# Patient Record
Sex: Female | Born: 1975 | Hispanic: Yes | Marital: Married | Smoking: Never smoker
Health system: Southern US, Community
[De-identification: ages and names within clinical notes are randomized; demographics above are authoritative.]

## PROBLEM LIST (undated history)

## (undated) ENCOUNTER — Inpatient Hospital Stay (HOSPITAL_COMMUNITY): Payer: Self-pay

## (undated) DIAGNOSIS — O09529 Supervision of elderly multigravida, unspecified trimester: Principal | ICD-10-CM

## (undated) DIAGNOSIS — O3660X0 Maternal care for excessive fetal growth, unspecified trimester, not applicable or unspecified: Secondary | ICD-10-CM

## (undated) HISTORY — DX: Maternal care for excessive fetal growth, unspecified trimester, not applicable or unspecified: O36.60X0

## (undated) HISTORY — DX: Supervision of elderly multigravida, unspecified trimester: O09.529

---

## 1998-06-13 ENCOUNTER — Ambulatory Visit (HOSPITAL_COMMUNITY): Admission: RE | Admit: 1998-06-13 | Discharge: 1998-06-13 | Payer: Self-pay | Admitting: *Deleted

## 1998-07-17 ENCOUNTER — Inpatient Hospital Stay (HOSPITAL_COMMUNITY): Admission: AD | Admit: 1998-07-17 | Discharge: 1998-07-17 | Payer: Self-pay | Admitting: *Deleted

## 1998-07-17 ENCOUNTER — Encounter: Payer: Self-pay | Admitting: *Deleted

## 1998-10-22 ENCOUNTER — Encounter: Payer: Self-pay | Admitting: *Deleted

## 1998-10-22 ENCOUNTER — Ambulatory Visit (HOSPITAL_COMMUNITY): Admission: RE | Admit: 1998-10-22 | Discharge: 1998-10-22 | Payer: Self-pay | Admitting: *Deleted

## 1998-11-09 ENCOUNTER — Inpatient Hospital Stay (HOSPITAL_COMMUNITY): Admission: AD | Admit: 1998-11-09 | Discharge: 1998-11-09 | Payer: Self-pay | Admitting: Obstetrics & Gynecology

## 1998-11-13 ENCOUNTER — Encounter: Payer: Self-pay | Admitting: *Deleted

## 1998-11-13 ENCOUNTER — Inpatient Hospital Stay (HOSPITAL_COMMUNITY): Admission: RE | Admit: 1998-11-13 | Discharge: 1998-11-13 | Payer: Self-pay | Admitting: *Deleted

## 1998-11-21 ENCOUNTER — Encounter (INDEPENDENT_AMBULATORY_CARE_PROVIDER_SITE_OTHER): Payer: Self-pay | Admitting: Specialist

## 1998-11-21 ENCOUNTER — Inpatient Hospital Stay (HOSPITAL_COMMUNITY): Admission: AD | Admit: 1998-11-21 | Discharge: 1998-11-29 | Payer: Self-pay | Admitting: Obstetrics

## 1998-11-25 ENCOUNTER — Encounter: Payer: Self-pay | Admitting: *Deleted

## 2003-02-16 ENCOUNTER — Inpatient Hospital Stay (HOSPITAL_COMMUNITY): Admission: RE | Admit: 2003-02-16 | Discharge: 2003-02-19 | Payer: Self-pay | Admitting: *Deleted

## 2003-02-23 ENCOUNTER — Encounter: Admission: RE | Admit: 2003-02-23 | Discharge: 2003-02-23 | Payer: Self-pay | Admitting: *Deleted

## 2003-03-08 ENCOUNTER — Inpatient Hospital Stay (HOSPITAL_COMMUNITY): Admission: RE | Admit: 2003-03-08 | Discharge: 2003-03-11 | Payer: Self-pay | Admitting: *Deleted

## 2003-03-15 ENCOUNTER — Encounter: Admission: RE | Admit: 2003-03-15 | Discharge: 2003-03-15 | Payer: Self-pay | Admitting: *Deleted

## 2003-03-22 ENCOUNTER — Inpatient Hospital Stay (HOSPITAL_COMMUNITY): Admission: RE | Admit: 2003-03-22 | Discharge: 2003-03-24 | Payer: Self-pay | Admitting: Obstetrics & Gynecology

## 2003-03-22 ENCOUNTER — Encounter: Admission: RE | Admit: 2003-03-22 | Discharge: 2003-03-22 | Payer: Self-pay | Admitting: *Deleted

## 2003-03-29 ENCOUNTER — Encounter: Admission: RE | Admit: 2003-03-29 | Discharge: 2003-03-29 | Payer: Self-pay | Admitting: *Deleted

## 2003-04-05 ENCOUNTER — Encounter: Admission: RE | Admit: 2003-04-05 | Discharge: 2003-04-05 | Payer: Self-pay | Admitting: *Deleted

## 2003-04-06 ENCOUNTER — Encounter: Admission: RE | Admit: 2003-04-06 | Discharge: 2003-04-06 | Payer: Self-pay | Admitting: *Deleted

## 2003-04-06 ENCOUNTER — Encounter: Admission: RE | Admit: 2003-04-06 | Discharge: 2003-04-06 | Payer: Self-pay | Admitting: Obstetrics & Gynecology

## 2003-04-12 ENCOUNTER — Ambulatory Visit (HOSPITAL_COMMUNITY): Admission: RE | Admit: 2003-04-12 | Discharge: 2003-04-12 | Payer: Self-pay | Admitting: *Deleted

## 2003-04-12 ENCOUNTER — Encounter: Admission: RE | Admit: 2003-04-12 | Discharge: 2003-04-12 | Payer: Self-pay | Admitting: *Deleted

## 2003-04-15 ENCOUNTER — Inpatient Hospital Stay (HOSPITAL_COMMUNITY): Admission: AD | Admit: 2003-04-15 | Discharge: 2003-04-19 | Payer: Self-pay | Admitting: *Deleted

## 2003-04-26 ENCOUNTER — Encounter: Admission: RE | Admit: 2003-04-26 | Discharge: 2003-04-26 | Payer: Self-pay | Admitting: *Deleted

## 2003-05-03 ENCOUNTER — Encounter: Admission: RE | Admit: 2003-05-03 | Discharge: 2003-05-03 | Payer: Self-pay | Admitting: *Deleted

## 2003-05-05 ENCOUNTER — Encounter: Admission: RE | Admit: 2003-05-05 | Discharge: 2003-05-05 | Payer: Self-pay | Admitting: *Deleted

## 2003-05-10 ENCOUNTER — Encounter: Admission: RE | Admit: 2003-05-10 | Discharge: 2003-05-10 | Payer: Self-pay | Admitting: *Deleted

## 2003-05-12 ENCOUNTER — Encounter (INDEPENDENT_AMBULATORY_CARE_PROVIDER_SITE_OTHER): Payer: Self-pay | Admitting: Specialist

## 2003-05-12 ENCOUNTER — Inpatient Hospital Stay (HOSPITAL_COMMUNITY): Admission: AD | Admit: 2003-05-12 | Discharge: 2003-05-14 | Payer: Self-pay | Admitting: Obstetrics & Gynecology

## 2006-09-07 ENCOUNTER — Inpatient Hospital Stay (HOSPITAL_COMMUNITY): Admission: AD | Admit: 2006-09-07 | Discharge: 2006-09-07 | Payer: Self-pay | Admitting: Gynecology

## 2006-10-08 ENCOUNTER — Ambulatory Visit: Payer: Self-pay | Admitting: Family Medicine

## 2006-10-08 ENCOUNTER — Encounter (INDEPENDENT_AMBULATORY_CARE_PROVIDER_SITE_OTHER): Payer: Self-pay | Admitting: Family Medicine

## 2006-10-08 LAB — CONVERTED CEMR LAB
Antibody Screen: NEGATIVE
Basophils Absolute: 0 10*3/uL (ref 0.0–0.1)
Basophils Relative: 0 % (ref 0–1)
Eosinophils Absolute: 0 10*3/uL (ref 0.0–0.7)
Eosinophils Relative: 0 % (ref 0–5)
HCT: 39.6 % (ref 36.0–46.0)
Hemoglobin: 12.7 g/dL (ref 12.0–15.0)
Hepatitis B Surface Ag: NEGATIVE
Lymphocytes Relative: 18 % (ref 12–46)
Lymphs Abs: 2.1 10*3/uL (ref 0.7–3.3)
MCHC: 32.1 g/dL (ref 30.0–36.0)
MCV: 95.4 fL (ref 78.0–100.0)
Monocytes Absolute: 0.6 10*3/uL (ref 0.2–0.7)
Monocytes Relative: 5 % (ref 3–11)
Neutro Abs: 9.2 10*3/uL — ABNORMAL HIGH (ref 1.7–7.7)
Neutrophils Relative %: 77 % (ref 43–77)
Platelets: 267 10*3/uL (ref 150–400)
RBC: 4.15 M/uL (ref 3.87–5.11)
RDW: 14.8 % — ABNORMAL HIGH (ref 11.5–14.0)
Rh Type: POSITIVE
Rubella: 170.4 intl units/mL — ABNORMAL HIGH
WBC: 12 10*3/uL — ABNORMAL HIGH (ref 4.0–10.5)

## 2006-10-15 ENCOUNTER — Ambulatory Visit: Payer: Self-pay | Admitting: Family Medicine

## 2006-10-15 DIAGNOSIS — O24319 Unspecified pre-existing diabetes mellitus in pregnancy, unspecified trimester: Secondary | ICD-10-CM

## 2006-10-19 ENCOUNTER — Ambulatory Visit (HOSPITAL_COMMUNITY): Admission: RE | Admit: 2006-10-19 | Discharge: 2006-10-19 | Payer: Self-pay | Admitting: Sports Medicine

## 2006-10-21 ENCOUNTER — Ambulatory Visit: Payer: Self-pay | Admitting: Family Medicine

## 2006-10-23 ENCOUNTER — Encounter (INDEPENDENT_AMBULATORY_CARE_PROVIDER_SITE_OTHER): Payer: Self-pay | Admitting: *Deleted

## 2006-10-29 ENCOUNTER — Ambulatory Visit: Payer: Self-pay | Admitting: Obstetrics & Gynecology

## 2006-10-29 ENCOUNTER — Encounter: Payer: Self-pay | Admitting: Obstetrics & Gynecology

## 2006-10-30 ENCOUNTER — Ambulatory Visit: Payer: Self-pay | Admitting: Gynecology

## 2006-10-30 ENCOUNTER — Encounter: Payer: Self-pay | Admitting: Obstetrics & Gynecology

## 2006-11-02 ENCOUNTER — Encounter: Admission: RE | Admit: 2006-11-02 | Discharge: 2006-11-02 | Payer: Self-pay | Admitting: Family Medicine

## 2006-11-02 ENCOUNTER — Ambulatory Visit: Payer: Self-pay | Admitting: Family Medicine

## 2006-11-09 ENCOUNTER — Ambulatory Visit: Payer: Self-pay | Admitting: Obstetrics & Gynecology

## 2006-11-12 ENCOUNTER — Ambulatory Visit: Payer: Self-pay | Admitting: Physician Assistant

## 2006-11-12 ENCOUNTER — Ambulatory Visit: Payer: Self-pay | Admitting: Obstetrics and Gynecology

## 2006-11-12 ENCOUNTER — Inpatient Hospital Stay (HOSPITAL_COMMUNITY): Admission: AD | Admit: 2006-11-12 | Discharge: 2006-11-15 | Payer: Self-pay | Admitting: Family Medicine

## 2006-11-13 ENCOUNTER — Encounter: Payer: Self-pay | Admitting: Family Medicine

## 2006-12-02 ENCOUNTER — Ambulatory Visit: Payer: Self-pay | Admitting: Obstetrics and Gynecology

## 2006-12-09 ENCOUNTER — Encounter: Payer: Self-pay | Admitting: *Deleted

## 2006-12-11 ENCOUNTER — Ambulatory Visit: Payer: Self-pay | Admitting: Family Medicine

## 2010-08-27 NOTE — Group Therapy Note (Signed)
NAME:  Rachael Russo, Rachael Russo NO.:  [id number]   MEDICAL RECORD NO.:  [id number]          PATIENT TYPE:  WOC   LOCATION:  [facility]                   FACILITY:  [facility]   PHYSICIAN:  Tinnie Gens, MD        DATE OF BIRTH:  December 29, 1975   DATE OF SERVICE:                                  CLINIC NOTE   Her information related to her hospitalization is under a different  medical record number, which is [id number] and is under the name Rachael Russo.   CHIEF COMPLAINT:  Abnormal bleeding.   HISTORY OF PRESENT ILLNESS:  The patient is a 35 year old gravida 3,  para 1-2-0-2 who had a 21-week admission for chorio and fever of unknown  origin, who subsequently went to labor and delivery.  A non-viable fetus  at 21 weeks.  The patient did have MRI, which showed normal-appearing  appendix.  The patient was seen here for post SAB followup and was doing  well.  She was started on Loestrin.  She started this on Sunday, and  then started bleeding approximately 3 days ago.  She reports a little  bit of pain.  The patient denies fevers, chills, nausea or vomiting.   EXAM:  VITAL SIGNS:  Noted in the chart.  She is a well-developed, well-nourished female, in no acute distress.  ABDOMEN:  Soft, nontender, nondistended.  GENITOURINARY:  Normal external female genitalia.  BUS glands normal.  Vagina is pink and rugous.  Cervix was closed.  The uterus was nontender  and small, anteverted.  No adnexal mass or tenderness.   IMPRESSION:  Abnormal bleeding after starting hormones.   PLAN:  1. Continue OCs for the next 2 weeks.  The patient will have a      followup appointment at that time.  If she is continuing to bleed,      may need to change her to a higher dose __________ , or if the      patient does not have any further bleeding, she can cancel her      followup appointment.  2. Gestational diabetes.  I have encouraged the patient to have      excellent blood sugar control prior to  attempting to achieve      pregnancy again.  I have encouraged her to get a primary care      physician.           ______________________________  Tinnie Gens, MD     TP/MEDQ  D:  12/11/2006  T:  12/12/2006  Job:  [id number]

## 2010-08-27 NOTE — Discharge Summary (Signed)
Rachael Russo, Rachael Russo               ACCOUNT NO.:  [id number]   MEDICAL RECORD NO.:  [id number]          PATIENT TYPE:  INP   LOCATION:  9310                          [facility]   PHYSICIAN:  Tanya S. Shawnie Pons, M.D.   DATE OF BIRTH:  June 24, 1975   DATE OF ADMISSION:  11/12/2006  DATE OF DISCHARGE:  11/15/2006                               DISCHARGE SUMMARY   Patient is a 35 year old Spanish-speaking Hispanic female who was  admitted at 16 and 1 weeks with severe left lower quadrant pain, no  visualization or laboratory studies appeared to be abnormal, that  included a CT scan, ultrasound and much blood work.  __________ she ran  a high fever and her vital signs, high fever to 103 on admission.  Blood  cultures were taken which have been negative and she had a tachycardia  up to 139.  Her blood pressure was always normal at about 115/58, there  was one drop at one time to 81/40 but had not been reported to the  physician's service, by the time they had seen her again she had  normalized her blood pressure.  Even on Physical Examination she had  vague left lower quadrant pain without guarding or rebound and when  distracted did not even seem to show signs of pain.  Nothing really was  found on this patient until the early morning of July 2 when she began  complaining of pain, was found to be 6 cm dilated and eventually  spontaneously delivered a nonviable fetus.  We kept her for 24 hours  longer because of the fever, her white count is normal at 13,000, her  temperature has been afebrile and we are going to discharge her to be  followed up to observe the pathology in the clinic in 2 weeks.   DISCHARGE DIAGNOSIS:  Abdominal pain and preterm delivery probably  secondary to high fever of unknown etiology.      Phil D. Okey Dupre, M.D.      Shelbie Proctor. Shawnie Pons, M.D.  Electronically Signed    PDR/MEDQ  D:  11/15/2006  T:  11/15/2006  Job:  [id number]

## 2010-08-30 NOTE — Discharge Summary (Signed)
Rachael Russo, Rachael Russo                           ACCOUNT NO.:  [id number]   MEDICAL RECORD NO.:  [id number]                   PATIENT TYPE:  INP   LOCATION:  9152                                 [facility]   PHYSICIAN:  Anastasio Auerbach, MD                    DATE OF BIRTH:  05-14-1975   DATE OF ADMISSION:  03/08/2003  DATE OF DISCHARGE:  03/11/2003                                 DISCHARGE SUMMARY   DISCHARGE DIAGNOSIS:  Threatened pre-term labor at 26-3/7 weeks.   DISCHARGE MEDICATIONS:  Procardia XL 30 mg b.i.d.   DISPOSITION:  The patient discharged to home on bedrest with bathroom  privileges only.   FOLLOW UP:  The patient advised to call on Monday, March 13, 2003 for  appointment in [facility] on next Wednesday.   HISTORY OF PRESENT ILLNESS:  This is a 35 year old G2, P1-0-0-1 who  presented at 26-3/7 weeks for repeat ultrasound. On ultrasound it was found  that there was no residual cervical length by ultrasound. The patient was  not contracting at the time. She had been admitted previously on November 4  to November 7 for threatened pre-term labor at that time.   HOSPITAL COURSE:  The patient was admitted and placed on p.o. Terbutaline  and the Procardia XL was stopped secondary to the patient reporting a rash  with the Procardia. However, after questioning the patient more with her  husband present considering this patient does not speak English, it was  reported that the rash was just on her hands and itched and did not feel  that this was secondary to the Procardia, so the patient was placed back on  the Procardia and told to followup with [facility] if the rash was to  develop again. The patient did not contract throughout the hospitalization.  Her cervix remained closed. At discharge, patient's cervix was closed,  approximately 80% to 90% effaced with negative 3 station. Ultrasound was  done while here to evaluate pregnancy with normal amniotic fluid,  posterior  placental location, cephalic presentation. The patient was placed on bedrest  and she did receive betamethasone x2 during hospitalization.   PLAN:  The plan for discharge was for the patient to continue bedrest at  home. Continue the Procardia XL 30 mg b.i.d. and to followup with [facility].   DISCHARGE LABORATORY DATA:  The patient had a non-reactive RPR. Wet prep was  negative. Fetal fibronectin was positive.                                               Anastasio Auerbach, MD    AD/MEDQ  D:  03/11/2003  T:  03/11/2003  Job:  [id number]

## 2010-08-30 NOTE — Discharge Summary (Signed)
Rachael Russo, Rachael Russo                           ACCOUNT NO.:  [id number]   MEDICAL RECORD NO.:  [id number]                   PATIENT TYPE:  INP   LOCATION:  9155                                 [facility]   PHYSICIAN:  Lesly Dukes, M.D.              DATE OF BIRTH:  1975/07/12   DATE OF ADMISSION:  03/22/2003  DATE OF DISCHARGE:                                 DISCHARGE SUMMARY   DISCHARGE DIAGNOSES:  1. Decreased cervical length.  2. Preterm labor.  3. Newly-diagnosed gestational diabetes mellitus.   DISCHARGE MEDICATIONS:  1. Procardia XL 30 mg one p.o. daily.  2. Prenatal vitamins one p.o. daily.   DISPOSITION:  Home.   FOLLOW-UP:  At the [facility] in one week.  The patient is instructed  to call for an appointment.   CONSULTATIONS:  Nutrition for diabetes counseling.   HISTORY AND PHYSICAL:  This is a 35 year old G2 P1-0-0-1 at 54 and four-  sevenths weeks with an estimated due date of June 11, 2003.  She  presents with no cervical length on ultrasound but no bleeding or  contractions are noted and active fetal movement.  She had a positive fetal  fibronectin on November 24.  She was sent from ultrasound for no cervical  length.  She was also admitted November 24 through November 27 and she had  betamethasone x2 at this time - the dates of betamethasone were November 24  and November 25.  She has also been treated with Indocin and Unasyn for  preterm labor as well as Procardia at home for preterm labor.  She has  gotten her care at the [facility].  She has had two previous  admissions for shortened cervix.  She was closed and 50% on February 16, 2003  and closed and 100% on February 23, 2003.  Medications are Procardia XL 30  mg one p.o. b.i.d. and a prenatal vitamin one p.o. daily.  Obstetrical  history:  Her previous pregnancy she had a low transverse cesarean section  for failure to progress and increased blood pressure, gave birth to a term  female infant.  No gynecological history, no medical history.  Surgical  history for the low transverse cesarean section.  Family medical history:  Diabetes mellitus.  She is O positive blood, antibody screen was negative,  hemoglobin 12.6, platelets 250, rubella immune, negative for hepatitis B,  negative for syphilis, negative for HIV, negative for GC, negative for  chlamydia, negative GBS on February 16, 2003.  Her physical exam is within  normal limits.  Digital cervical exam:  She is 1, 90%, and ballotable, and  cephalic presentation.  External fetal monitoring:  Baseline rate 140s with  good reactivity, average variability, and no decelerations.  She has no  contractions.  Assessment:  Rule out preterm labor and shortened cervical  length.  Plan was admit, place the patient in  Trendelenburg, and continue  Procardia.   HOSPITAL COURSE:  #1 - PRETERM LABOR.  The patient did not have any  contractions during her hospitalization and never suffered any rupture of  membranes during this hospitalization.  It should be noted that at a  previous visit on November 24 and November 25 she received two doses of  betamethasone.   #2 - SHORTENED CERVICAL LENGTH.  The patient was maintained in Trendelenburg  and with complete bedrest and at discharge her cervix was dilated 1 cm,  approximately 50 % effaced, with a ballotable head.   #3 - GESTATIONAL DIABETES, NEWLY DIAGNOSED.  She had a fasting fingerstick  blood sugar of 118 and a two-hour postprandial fingerstick blood sugar which  was 111.  She has had counseling regarding a gestational diabetes diet and  this should be followed up carefully as an outpatient.     Ursula Beath, MD                     Lesly Dukes, M.D.    JT/MEDQ  D:  03/24/2003  T:  03/24/2003  Job:  [id number]   cc:   [facility]

## 2010-08-30 NOTE — Discharge Summary (Signed)
Rachael Russo, Rachael Russo                           ACCOUNT NO.:  [id number]   MEDICAL RECORD NO.:  [id number]                   PATIENT TYPE:  INP   LOCATION:  9158                                 [facility]   PHYSICIAN:  Phil D. Okey Dupre, M.D.                  DATE OF BIRTH:  [birth year]   DATE OF ADMISSION:  04/15/2003  DATE OF DISCHARGE:                                 DISCHARGE SUMMARY   DISCHARGE DIAGNOSES:  1. Threatened preterm labor.  2. Gestational diabetes mellitus.   HOSPITAL COURSE:  Problem 1. THREATENED PRETERM LABOR.  Patient is a 35-year-  old female who presented to [facility] as a direct admission from the  clinic with uterine irritability.  Given her history of a shortened cervix  there was a concern for threatened preterm labor.  The patient was admitted.  On April 15, 2003 the patient had a sterile vaginal exam which showed a  loose 1 cm and a bag at the external os.  Patient received p.o. terbutaline  and Unasyn to help.  Also of note the patient had been transiently on  Procardia but that was discontinued.  Patient did well throughout her  hospital course with no change in cervical dilatation.  The fetal heart  tracings were reassuring and mom was set up for discharge on April 19, 2003  with followup at her regular clinic.  She will be continued on her  terbutaline and switched from Unasyn to Augmentin.  Of note the patient has  already received two doses of betamethasone in the past for fetal lung  maturity.   Problem 2. GESTATIONAL DIABETES.  Initially the patient had a CBG of 114  then had pre-meals of 96-133 and post-meals as high as 147-148.  The  decision was made to give her diabetic education and begin insulin.  She was  initially started on NPH Insulin at a dose of 6 units b.i.d.  Over time this  was increased to 10 units of NPH b.i.d. with response in lowering the  patient's CBGs.  She was still having rather high postprandial's as high as  150  and the decision was made to give her Regular Insulin with meals.  She  was started on initially 5 units of Regular Insulin with breakfast and with  dinner in combination with her NPH of 10 units at bedtime and morning.  On  the day of discharge her sugars had come down significantly and preprandial  were in the range of 78-90 and postprandial were 112-150.  On discharge her  Regular will be titrated up to 7 units with breakfast and dinner and to  continue her NPH up to 10 units b.i.d.  She will be discharged with diabetic  education and to monitor her blood sugars before meals and 2 hours  postprandial dinner.  We will have her follow up for review of her blood  sugars within 1 week's time.   Discharge medications will include NPH Insulin 10 units in the morning  subcutaneously and 10 units at bedtime subcutaneously, Regular Insulin 7  units subcutaneously with breakfast and 7 units with dinner.  Patient also  will be discharged on Augmentin 875/125 b.i.d. for a total of 10 days to  complete antibiotic therapy and also continue her terbutaline as scheduled.  The terbutaline will continue until she is followed up at her clinic visit  next week 7 days from now and that will be dosed at 2.5 mg p.o. every 4  hours.   The patient will be discharged with labor precautions, preeclampsia  precautions, and preterm labor precautions as well as to have pelvic rest  and no overactivity.  She should notify her doctor with any concerns  whatsoever.     Rachael Russo, M.D.                   Phil D. Okey Dupre, M.D.    GJO/MEDQ  D:  04/19/2003  T:  04/19/2003  Job:  [id number]

## 2010-08-30 NOTE — Discharge Summary (Signed)
   Rachael Russo, Rachael Russo                           ACCOUNT NO.:  [id number]   MEDICAL RECORD NO.:  [id number]                   PATIENT TYPE:  INP   LOCATION:  9152                                 [facility]   PHYSICIAN:  Jonah Blue, M.D.                DATE OF BIRTH:  10-13-1975   DATE OF ADMISSION:  02/16/2003  DATE OF DISCHARGE:  02/19/2003                                 DISCHARGE SUMMARY   DISCHARGE DIAGNOSES:  Threatened pre-term labor at 35 and [redacted] weeks gestation.   DISCHARGE MEDICATIONS:  1. Procardia XL 30 mg p.o. b.i.d.  2. Prenatal vitamins 1 p.o. q.d.   FOLLOW UP:  The patient is to followup at the [facility] either  Wednesday, February 22, 2003 or Thursday, February 23, 2003.   HISTORY OF PRESENT ILLNESS:  A 35 year old G2, P1 presenting at 23-[redacted] weeks  gestation with a short cervix on ultrasound measuring 1.2 cm. Physical  examination showed the cervix to be fingertip, tight, 50% and high. The  patient was admitted for bedrest, Unasyn and Indocin.   HOSPITAL COURSE:  The patient did not have further development of her lower  uterine segment or difficulties during her hospitalization. The Indocin was  stopped and the patient was started on Procardia without further development  of contractions and thus, she was deemed appropriate for discharge to home.  Because she had had a history of PIH with her last pregnancy, a 24 hour  urine and PIH labs were obtained. PIH labs were completely normal. The 24  hour urine was pending at the time of discharge. SBE on discharge is  fingertip, 2 to 3 cm, thick, with minimal lower uterine segment development.   LABORATORY DATA:  White blood cell 7.2, hemoglobin 12.0, platelets 235,000.  Sodium 137, potassium 3.6, chloride 106, CO2 24, glucose 84, BUN 6,  creatinine 0.6, calcium 9.5, total protein 6.7, albumin 3.4, AST 15, ALT  less than 19, alkaline phosphatase 64, total bilirubin 0.4, LDH 110, uric  acid 3.7. A 24 hour  urine total volume 3,400 cc. Total protein 1 mg in 24  hours. Group B strep negative. Wet prep with few WBC's, otherwise negative.  GC and Chlamydia negative. RPR negative.   DISPOSITION:  The patient was discharged to home on hospital day 2 without  further difficulty.                                               Jonah Blue, M.D.    Milas Gain  D:  02/19/2003  T:  02/19/2003  Job:  [id number]   cc:   [facility]

## 2011-01-27 LAB — COMPREHENSIVE METABOLIC PANEL
ALT: 14
AST: 18
Albumin: 3.2 — ABNORMAL LOW
Alkaline Phosphatase: 70
BUN: 6
CO2: 18 — ABNORMAL LOW
Calcium: 9.3
Chloride: 102
Creatinine, Ser: 0.52
GFR calc Af Amer: >60
GFR calc non Af Amer: >60
Glucose, Bld: 111 — ABNORMAL HIGH
Potassium: 3.2 — ABNORMAL LOW
Sodium: 134 — ABNORMAL LOW
Total Bilirubin: 0.6
Total Protein: 6.2

## 2011-01-27 LAB — POCT URINALYSIS DIP (DEVICE)
Bilirubin Urine: NEGATIVE
Bilirubin Urine: NEGATIVE
Bilirubin Urine: NEGATIVE
Glucose, UA: >=1000 — AB
Ketones, ur: 15 — AB
Ketones, ur: 15 — AB
pH: 6
pH: 6
pH: 7

## 2011-01-27 LAB — DIFFERENTIAL
Basophils Absolute: 0
Basophils Absolute: 0
Basophils Relative: 0
Basophils Relative: 0
Eosinophils Absolute: 0
Eosinophils Absolute: 0.1
Eosinophils Absolute: 0
Eosinophils Relative: 0
Eosinophils Relative: 1
Lymphocytes Relative: 24
Lymphocytes Relative: 5 — ABNORMAL LOW
Lymphs Abs: 1
Lymphs Abs: 2.3
Lymphs Abs: 0.5 — ABNORMAL LOW
Monocytes Absolute: 0.5
Monocytes Absolute: 0.7
Monocytes Relative: 3
Monocytes Relative: 7
Monocytes Relative: 4
Neutro Abs: 17.3 — ABNORMAL HIGH
Neutro Abs: 6.6
Neutro Abs: 15 — ABNORMAL HIGH
Neutrophils Relative %: 68
Neutrophils Relative %: 92 — ABNORMAL HIGH
Neutrophils Relative %: 93 — ABNORMAL HIGH

## 2011-01-27 LAB — URINE MICROSCOPIC-ADD ON

## 2011-01-27 LAB — CBC
HCT: 32.3 — ABNORMAL LOW
HCT: 32.9 — ABNORMAL LOW
HCT: 34.3 — ABNORMAL LOW
Hemoglobin: 11.1 — ABNORMAL LOW
Hemoglobin: 11.5 — ABNORMAL LOW
Hemoglobin: 11.8 — ABNORMAL LOW
Hemoglobin: 12.9
MCHC: 34.2
MCHC: 34.5
MCHC: 35
MCV: 94.3
MCV: 94.4
MCV: 95.1
MCV: 95.3
Platelets: 195
Platelets: 196
Platelets: 210
RBC: 3.42 — ABNORMAL LOW
RBC: 3.49 — ABNORMAL LOW
RBC: 3.61 — ABNORMAL LOW
RBC: 3.96
RDW: 14
RDW: 14.3 — ABNORMAL HIGH
RDW: 14.4 — ABNORMAL HIGH
WBC: 15 — ABNORMAL HIGH
WBC: 18.8 — ABNORMAL HIGH
WBC: 9.8
WBC: 16.2 — ABNORMAL HIGH

## 2011-01-27 LAB — URINALYSIS, ROUTINE W REFLEX MICROSCOPIC
Glucose, UA: 250 — AB
Specific Gravity, Urine: >1.03 — ABNORMAL HIGH

## 2011-01-27 LAB — ROCKY MTN SPOTTED FVR AB, IGM-BLOOD: RMSF IgM: 0.16

## 2011-01-27 LAB — LIPASE, BLOOD: Lipase: 13

## 2011-01-27 LAB — CULTURE, BLOOD (ROUTINE X 2)

## 2011-01-27 LAB — STREP B DNA PROBE: Strep Group B Ag: POSITIVE

## 2011-01-27 LAB — AMYLASE: Amylase: 76

## 2011-01-27 LAB — GC/CHLAMYDIA PROBE AMP, GENITAL
Chlamydia, DNA Probe: NEGATIVE
GC Probe Amp, Genital: NEGATIVE

## 2011-01-27 LAB — RH IMMUNE GLOB WKUP(>/=20WKS)(NOT WOMEN'S HOSP)

## 2011-01-27 LAB — ROCKY MTN SPOTTED FVR AB, IGG-BLOOD: RMSF IgG: 0.09 {ISR}

## 2011-09-19 ENCOUNTER — Emergency Department (HOSPITAL_COMMUNITY): Payer: Self-pay

## 2011-09-19 ENCOUNTER — Emergency Department (HOSPITAL_COMMUNITY)
Admission: EM | Admit: 2011-09-19 | Discharge: 2011-09-19 | Disposition: A | Discharge status: Home / Self Care | Payer: Self-pay | Attending: Emergency Medicine | Admitting: Emergency Medicine

## 2011-09-19 ENCOUNTER — Encounter (HOSPITAL_COMMUNITY): Payer: Self-pay | Admitting: Emergency Medicine

## 2011-09-19 DIAGNOSIS — R51 Headache: Secondary | ICD-10-CM | POA: Insufficient documentation

## 2011-09-19 DIAGNOSIS — R1013 Epigastric pain: Secondary | ICD-10-CM | POA: Insufficient documentation

## 2011-09-19 DIAGNOSIS — R1110 Vomiting, unspecified: Secondary | ICD-10-CM | POA: Insufficient documentation

## 2011-09-19 DIAGNOSIS — R509 Fever, unspecified: Secondary | ICD-10-CM | POA: Insufficient documentation

## 2011-09-19 DIAGNOSIS — R739 Hyperglycemia, unspecified: Secondary | ICD-10-CM

## 2011-09-19 DIAGNOSIS — N390 Urinary tract infection, site not specified: Secondary | ICD-10-CM | POA: Insufficient documentation

## 2011-09-19 DIAGNOSIS — R1031 Right lower quadrant pain: Secondary | ICD-10-CM | POA: Insufficient documentation

## 2011-09-19 DIAGNOSIS — E1169 Type 2 diabetes mellitus with other specified complication: Secondary | ICD-10-CM | POA: Insufficient documentation

## 2011-09-19 LAB — CBC
HCT: 38.3 % (ref 36.0–46.0)
Hemoglobin: 13.5 g/dL (ref 12.0–15.0)
MCHC: 35.2 g/dL (ref 30.0–36.0)
RBC: 4.35 MIL/uL (ref 3.87–5.11)
WBC: 24.6 10*3/uL — ABNORMAL HIGH (ref 4.0–10.5)

## 2011-09-19 LAB — PREGNANCY, URINE: Preg Test, Ur: NEGATIVE

## 2011-09-19 LAB — COMPREHENSIVE METABOLIC PANEL
Alkaline Phosphatase: 120 U/L — ABNORMAL HIGH (ref 39–117)
BUN: 13 mg/dL (ref 6–23)
CO2: 20 mEq/L (ref 19–32)
Chloride: 96 mEq/L (ref 96–112)
Creatinine, Ser: 0.75 mg/dL (ref 0.50–1.10)
GFR calc non Af Amer: >90 mL/min (ref >90)
Glucose, Bld: 230 mg/dL — ABNORMAL HIGH (ref 70–99)
Potassium: 3.9 mEq/L (ref 3.5–5.1)
Total Bilirubin: 0.6 mg/dL (ref 0.3–1.2)

## 2011-09-19 LAB — URINALYSIS, ROUTINE W REFLEX MICROSCOPIC
Ketones, ur: >80 mg/dL — AB
Protein, ur: 100 mg/dL — AB
Urobilinogen, UA: 1 mg/dL (ref 0.0–1.0)

## 2011-09-19 LAB — URINE MICROSCOPIC-ADD ON

## 2011-09-19 LAB — GLUCOSE, CAPILLARY: Glucose-Capillary: 217 mg/dL — ABNORMAL HIGH (ref 70–99)

## 2011-09-19 LAB — DIFFERENTIAL
Lymphocytes Relative: 4 % — ABNORMAL LOW (ref 12–46)
Lymphs Abs: 1 10*3/uL (ref 0.7–4.0)
Monocytes Absolute: 1.7 10*3/uL — ABNORMAL HIGH (ref 0.1–1.0)
Monocytes Relative: 7 % (ref 3–12)
Neutro Abs: 21.9 10*3/uL — ABNORMAL HIGH (ref 1.7–7.7)

## 2011-09-19 LAB — LIPASE, BLOOD: Lipase: 12 U/L (ref 11–59)

## 2011-09-19 MED ORDER — ACETAMINOPHEN 325 MG PO TABS
650.0000 mg | ORAL_TABLET | Freq: Four times a day (QID) | ORAL | Status: DC | PRN
  Administered 2011-09-19: 650 mg via ORAL

## 2011-09-19 MED ORDER — ACETAMINOPHEN 325 MG PO TABS
ORAL_TABLET | ORAL | Status: AC
  Administered 2011-09-19: 650 mg via ORAL
  Filled 2011-09-19: qty 2

## 2011-09-19 MED ORDER — DEXTROSE 5 % IV SOLN
1.0000 g | Freq: Once | INTRAVENOUS | Status: AC
  Administered 2011-09-19: 1 g via INTRAVENOUS
  Filled 2011-09-19: qty 10

## 2011-09-19 MED ORDER — HYDROMORPHONE HCL PF 1 MG/ML IJ SOLN
1.0000 mg | Freq: Once | INTRAMUSCULAR | Status: AC
  Administered 2011-09-19: 1 mg via INTRAVENOUS
  Filled 2011-09-19: qty 1

## 2011-09-19 MED ORDER — ONDANSETRON HCL 4 MG/2ML IJ SOLN
4.0000 mg | Freq: Once | INTRAMUSCULAR | Status: AC
  Administered 2011-09-19: 4 mg via INTRAVENOUS
  Filled 2011-09-19: qty 2

## 2011-09-19 MED ORDER — CEPHALEXIN 250 MG PO CAPS: 500.0000 mg | ORAL_CAPSULE | Freq: Four times a day (QID) | ORAL | Status: AC

## 2011-09-19 MED ORDER — SODIUM CHLORIDE 0.9 % IV SOLN
1000.0000 mL | INTRAVENOUS | Status: DC
  Administered 2011-09-19: 1000 mL via INTRAVENOUS

## 2011-09-19 MED ORDER — SODIUM CHLORIDE 0.9 % IV SOLN
1000.0000 mL | Freq: Once | INTRAVENOUS | Status: AC
  Administered 2011-09-19: 1000 mL via INTRAVENOUS

## 2011-09-19 NOTE — ED Notes (Signed)
For 3 days , pt having fevers and headache, denies cough, but does report nausea; pt speaks spanish and husband translating; took asa 1 hour ago, Advil this AM

## 2011-09-19 NOTE — ED Provider Notes (Signed)
MSE was initiated and I personally evaluated the patient and placed orders (if any) at  6:15 PM on September 19, 2011. The patient presents with fever, headache, tachycardia and hypotension. Her symptoms started a few days ago initially with abdominal pain. Patient has subsequently started developing fever as well as headache. She has had nausea and vomiting.  On exam she has had some mild upper abdominal discomfort in the right upper quadrant. Her mucous membranes appear dry. Her lungs are clear but she is tachycardic on cardiac exam. IV fluids will be ordered as well as labs  And urinalysis. The remainder of the MSE may be completed by another ED provider.  Celene Kras, MD 09/19/11 [phone]

## 2011-09-19 NOTE — ED Notes (Signed)
Pt denies any pain or question upon discharge. 

## 2011-09-19 NOTE — Discharge Instructions (Signed)
Hiperglucemia (Hyperglycemia) La hiperglucemia ocurre cuando la glucosa (azcar) en su sangre est demasiado elevada. Puede suceder por varias razones, pero a menudo ocurre en personas que no saben que tienen diabetes o no la controlan adecuadamente.  CAUSAS Tanto si tiene diabetes como si no, existen otras causas para la hiperglucemia. La hiperglucemia puede producirse cuando tiene diabetes, pero tambin puede presentarse en otras situaciones de las que podra no ser consciente, como por ejemplo: Diabetes  Si tiene diabetes y tiene problemas para controlar su glucosa en sangre, la hiperglucemia podra producirse debido a las siguientes razones:   No seguir Press photographer.   No tomar los medicamentos para la diabetes o tomarlos de forma inadecuada.   Realizar menos ejercicio del que normalmente hace.   Estar enfermo.  Prediabetes  Esto no puede ignorarse. Antes de que la persona presente diabetes de tipo 2, casi siempre hay "prediabetes". Esto ocurre cuando su glucosa en sangre es mayor que lo normal, pero no lo suficiente como para diagnosticar diabetes. La investigacin ha demostrado que algunos daos al cuerpo de Air cabin crew, en especial los del corazn y el sistema circulatorio, podran haber ocurrido durante el periodo de prediabetes. Si controla la glucosa en sangre cuando tiene prediabetes, podr retardar o evitar que se desarrrolle la diabetes tipo 2.  El estrs  Si tiene diabetes, deber hacer una dieta, tomar medicamentos orales o insulina para [place]. Sin embargo, Clinical research associate que la glucosa en sangre es mayor que lo normal en el hospital tenga o no diabetes. Cientficamente se lo denomina "hiperglucemia por estrs". El estrs puede elevar su glucosa en sangre. Esto ocurre porque el organismo genera hormonas en los momentos de estrs. Si el estrs ha [employer] causa del alto nivel de glucosa en [place], [place] mdico podr Education officer, environmental un seguimiento de [place] regular. Feliberto Harts, podr asegurarse de que la hiperglucemia no empeora o progresa hacia diabetes.  Esteroides  Los esteroides son medicamentos que actan en la infeccin que ataca al sistema inmunolgico para bloquear la inflamacin o la infeccin. Un efecto secundario puede ser el aumento de glucosa en [place]. Muchas personas pueden producir la suficiente insulina extra para este aumento, pero aquellos que no pueden, los esteroides pueden [employer] niveles sean an [place]. No es inusual que los tratamientos con esteorides "destapen" una diabetes que se est desarrollando. No siempre es posible determinar si la hiperglucemia desaparecer una vez que se detenga el consumo de esteroides. A veces se realiza un anlisis de sangre especial denominado A1c para determinar si la glucosa en sangre se ha elevado antes de comenzar con el consumo de esteroides.  SNTOMAS  Sed.   Necesidad frecuente de Geographical information systems officer.   [employer].   Visin borrosa.   Cansancio o fatiga.   Debilidad.   Somnolencia.   Hormigueo en el pie o pierna.  DIAGNSTICO El diagnstico se realiza mediante el control de la glucosa en sangre de una o varias de las siguientes maneras:  Anlisis A1c. Es una sustancia qumica que se encuentra en la [place].   Control de glucosa en sangre con tiras de prueba.   Resultados de laboratorio.  TRATAMIENTO Primero, es importante conocer la causa de la hiperglucemia antes de tratarla. El tratamiento puede ser el siguiente, [place] pueden ser otros:  Educacin   Cambios o ajustes en los medicamentos.   Cambios o ajustes en el plan de alimentacin.   Tratamiento por enfermedades, infecciones, etc.   Control de glucosa en sangre ms frecuente.  Cambios en el plan de ejercicios.   Disminucin o interrupcin del consumo de esteroides.   Cambios en el estilo de vida.  INSTRUCCIONES PARA EL CUIDADO DOMICILIARIO  Contrlese la glucosa en sangre, como se lo indicaron.   Haga ejercicios  regularmente. El profesional que lo asiste le dar instrucciones relacionadas con el ejercicio fsico. La prediabetes que es consecuencia de situaciones de estrs, puede mejorar con la actividad fsica.   Consuma alimentos saludables y balanceados. Coma a menudo y de [place] regular, en momentos fijos. El profesional o el nutricionista le dar una dieta especial para controlar su ingestin de azcar.   Mantener su peso ideal es importante. Si lo necesita, perder un poco de peso, como 5  7 Kg. puede ser beneficioso para [employer] niveles de Production assistant, radio.  SOLICITE ATENCIN MDICA SI:  Tiene preguntas relacionadas con los medicamentos, la actividad o la dieta.   Contina teniendo sntomas (como mucha sed, deseos intensos de Geographical information systems officer o aumento de peso)  SOLICITE ATENCIN MDICA DE INMEDIATO SI:  Vomita o tiene diarrea.   Su respiracin huele frutal.   La frecuencia respiratoria es ms rpida o ms lenta.   Est somnoliento o incoherente.   Siente adormecimiento, hormigueos o Tax adviser o en las manos.   Siente dolor en el pecho.   Sus sntomas empeoran aunque haya seguido las indicaciones de su mdico.   Tiene otras preguntas o preocupaciones.  Document Released: 03/31/2005 Document Revised: 03/20/2011 [facility] Patient Information 2012 [place], [place].Infeccin del tracto urinario (Urinary Tract Infection) Las infecciones en el tracto urinario pueden comenzar en varios lugares. Una infeccin en la vejiga (cistitis), una infeccin en el rin (pielonefritis) o una infeccin en la prstata (prostatitis) son diferentes tipos de infeccin del tracto urinario. Por lo general mejoran si se los trata con antibiticos. Los antibiticos son medicamentos que matan grmenes. [employer] medicamentos que le han recetado hasta que se terminen. Podr sentirse bien dentro de [address], pero DEBE TOMAR LOS MEDICAMENTOS HASTA TERMINAR EL TRATAMIENTO, de lo contrario la infeccin puede no  solucionarse y luego ser ms difcil de Warehouse manager. INSTRUCCIONES PARA EL CUIDADO DOMICILIARIO  Beba gran cantidad de lquidos para mantener la orina de tono claro o color amarillo plido. Se recomienda especialmente el jugo de arndanos rojos, adems de grandes cantidades de [place].   Evite la cafena, el t y las bebidas con gas. Estas sustancias irritan la vejiga.   El alcohol puede Teacher, adult education.   Utilice los medicamentos de venta libre o de prescripcin para Chief Technology Officer, Environmental health practitioner o la [place], segn se lo indique el profesional que lo asiste.  PARA PREVENIR FUTURAS INFECCIONES:  Vace la vejiga con frecuencia. Evite retener la orina durante largos perodos.   Despus de mover el intestino, las mujeres deben higienizarse la regin perineal desde adelante hacia atrs. Use cada papel tissue slo una vez.   Vace la vejiga antes y despus de Management consultant.  OBTENER LOS RESULTADOS DE LAS PRUEBAS Durante su visita no contar con todos los [employer]. En este caso, tenga otra entrevista con su mdico para conocerlos. No piense que el resultado es normal si no tiene noticias de su mdico o de la institucin mdica. Es Copy seguimiento de todos los [place] de [place].  SOLICITE ATENCIN MDICA SI:  Siente dolor en la espalda.   El beb tiene ms de 3 meses y su temperatura rectal es de 100.5 F (38.1 C) o  ms durante ms de 1 da.   Los problemas (sntomas) no mejoran en 3 das. Solicite atencin mdica antes si empeora.  SOLICITE ATENCIN MDICA DE INMEDIATO SI:  Comienza a sentir un dolor de espaldas o en la zona abdominal inferior intenso.   Comienza a sentir escalofros.   Tiene fiebre.   Su beb tiene ms de 3 meses y su temperatura rectal es de 102 F (38.9 C) o mayor.   Su beb tiene 3 meses o menos y su temperatura rectal es de 100.4 F (38 C) o mayor.   Siente nuseas o vmitos.   Tiene una sensacin continua de quemazn o  molestias al [employer].  EST SEGURO QUE:  Comprende las instrucciones para el alta mdica.   Controlar su enfermedad.   Solicitar atencin mdica de inmediato segn las indicaciones.  Document Released: 01/08/2005 Document Revised: 03/20/2011 [facility] Patient Information 2012 [place], [place].

## 2011-09-19 NOTE — ED Provider Notes (Signed)
History     CSN: [id number]  Arrival date & time 09/19/11  1727   First MD Initiated Contact with Patient 09/19/11 1814      Chief Complaint  Patient presents with  . Fever  . Headache    (Consider location/radiation/quality/duration/timing/severity/associated sxs/prior treatment) HPI  Talked to pt with translator line.  Patient is a 36 year old female with a 24-hour history of first epigastric abdominal pain sharp stabbing but relatively mild at 3/10 which then migrated to right lower quadrant briefly then dissipated entirely last night and she was left with a fever and a headache headache intensity of 10/10. This morning she had a fever 101 at home today and came to the emergency department she vomited 3 times at home nonbilious nonbloody before presentation on arrival in the emergency department her temperature was 101.9, her pulse is 1:30, her ventilations were 22, and her blood pressure was 93/66. Her saturation was 98. Her headache was described as 8 or 10 of 10. She was given a medical screening exam and treated with 3 L of normal saline as well as Dilaudid and Tylenol. On my initial presentation with her, she seems sleepy and out of it, but her headache had completely resolved. She denied any recent coughing, dysuria, chest pain, shortness of breath, joint pain, rashes, or fevers. She also denies dysuria and vaginal discharge. She called her illness severe initially but only moderate right now.  Past Medical History  Diagnosis Date  . Diabetes mellitus     Past Surgical History  Procedure Date  . Cesarean section     History reviewed. No pertinent family history.  History  Substance Use Topics  . Smoking status: Never Smoker   . Smokeless tobacco: Not on file  . Alcohol Use: No    OB History    Grav Para Term Preterm Abortions TAB SAB Ect Mult Living                  Review of Systems Constitutional: POS for fever and chills.  HENT: Negative for ear pain, sore  throat and trouble swallowing.   Eyes: Negative for pain and visual disturbance.  Respiratory: Negative for cough and shortness of breath.   Cardiovascular: Negative for chest pain and leg swelling.  Gastrointestinal: POS for nausea, vomiting, POS abdominal pain and negdiarrhea.  Genitourinary: Negative for dysuria, urgency and frequency.  Musculoskeletal: Negative for back pain and joint swelling.  Skin: Negative for rash and wound.  Neurological: Negative for dizziness, syncope, speech difficulty, weakness and numbness.    Allergies  Review of patient's allergies indicates no known allergies.  Home Medications   Current Outpatient Rx  Name Route Sig Dispense Refill  . ASPIRIN 500 MG PO TBEC Oral Take 500 mg by mouth every 6 (six) hours as needed. For pain    . GLIMEPIRIDE 2 MG PO TABS Oral Take 2 mg by mouth daily before breakfast.    . IBUPROFEN 200 MG PO TABS Oral Take 200 mg by mouth every 6 (six) hours as needed. For pain    . METFORMIN HCL 500 MG PO TABS Oral Take 500 mg by mouth 2 (two) times daily with a meal.    . CEPHALEXIN 250 MG PO CAPS Oral Take 2 capsules (500 mg total) by mouth 4 (four) times daily. 28 capsule 0    BP 99/56  Pulse 101  Temp(Src) 97.8 F (36.6 C) (Oral)  Resp 16  SpO2 98%  LMP 08/27/2011  Physical Exam Consitutional: Pt  in no acute distress.   Head: Normocephalic and atraumatic.  Eyes: Extraocular motion intact, no scleral icterus Neck: Supple without meningismus, mass, or overt JVD Respiratory: Effort normal and breath sounds normal. No respiratory distress. CV: Heart regular rate and rhythm, no obvious murmurs.  Pulses +2 and symmetric Abdomen: Soft, non-tender, non-distended. No CVA TTP. MSK: Extremities are atraumatic without deformity, ROM intact Skin: Warm, dry, intact Neuro: Alert and oriented, no motor deficit noted.  CNs intact.  PERRL.  Reflexes normal BLE, no clonus.  Hips, knee and ankle, arm and wrist strength maintained.  FTN  and RAM normal.  CNs 2-12 normal.  Psychiatric: Mood and affect are normal    ED Course  Procedures (including critical care time)  Labs Reviewed  CBC - Abnormal; Notable for the following:    WBC 24.6 (*)    All other components within normal limits  DIFFERENTIAL - Abnormal; Notable for the following:    Neutrophils Relative 89 (*)    Neutro Abs 21.9 (*)    Lymphocytes Relative 4 (*)    Monocytes Absolute 1.7 (*)    All other components within normal limits  COMPREHENSIVE METABOLIC PANEL - Abnormal; Notable for the following:    Sodium 133 (*)    Glucose, Bld 230 (*)    Total Protein 8.5 (*)    Alkaline Phosphatase 120 (*)    All other components within normal limits  URINALYSIS, ROUTINE W REFLEX MICROSCOPIC - Abnormal; Notable for the following:    Color, Urine AMBER (*) BIOCHEMICALS MAY BE AFFECTED BY COLOR   APPearance CLOUDY (*)    Specific Gravity, Urine 1.031 (*)    Glucose, UA >1000 (*)    Hgb urine dipstick SMALL (*)    Ketones, ur >80 (*)    Protein, ur 100 (*)    Nitrite POSITIVE (*)    Leukocytes, UA SMALL (*)    All other components within normal limits  GLUCOSE, CAPILLARY - Abnormal; Notable for the following:    Glucose-Capillary 217 (*)    All other components within normal limits  URINE MICROSCOPIC-ADD ON - Abnormal; Notable for the following:    Squamous Epithelial / LPF MANY (*)    Bacteria, UA MANY (*)    All other components within normal limits  LIPASE, BLOOD  PREGNANCY, URINE   Dg Chest 2 View  09/19/2011  *RADIOLOGY REPORT*  Clinical Data: Fever and headache.  CHEST - 2 VIEW  Comparison: None.  Findings:  The heart size and mediastinal contours are normal. The lungs are clear. There is no pleural effusion or pneumothorax. No acute osseous findings are identified.  IMPRESSION: No active cardiopulmonary process.  Original Report Authenticated By: Gerrianne Scale, M.D.     1. Hyperglycemia   2. Urinary tract infection       MDM  No  concern for meningitis for this patient. Her headache is completely resolved with Tylenol and Dilaudid. Of note her fever is likely caused by urinary tract infection given her initial workup results. She has absolutely no meningeal signs. Her neurological exam is normal. She's had no rashes.    Her urinalysis is notable for ketones, protein, positive nitrite.  Additionally she also has white blood cell count of 24 and AB Neutro count of 21.9.  Negative CXR.  I suspect her "hypotenion" is baseline because her C02, BUN and Cr are all normal.  Negative CVA TTP.  Bun/Cr ratio low.  Give rocephin for UTI.   Patient was reassessed after  an approximate given. She has no headache. She has no abdominal pain. She feels very for discharge. Patient will be discharged home on antibiotics to followup with her primary care provider.  We will have her see a local provider to followup.  Plan and discharge instructions discussed with the patient over the translator line. PT DC home stable.  Discussed with pt the clinical impression, treatment in the ED, and follow up plan.  We alslo discussed the indications for returning to the ED, which include shortness or breath, confusion, fever, new weakness or numbness, chest pain, or any other concerning symptom.  The pt understood the treatment and plan, is stable, and is able to leave the ED.           Larrie Kass, MD 09/19/11 [phone]

## 2011-09-22 NOTE — ED Provider Notes (Signed)
I have personally seen and examined the patient.  I have discussed plan of care with resident.   I have reviewed the documentation of the resident and agree.  Pt seen with resident, no signs of meningitis, she is well appearing/nontoxic, UTI noted Stable for d/c BP 96/60  Pulse 77  Temp(Src) 98.3 F (36.8 C) (Oral)  Resp 20  SpO2 100%  LMP 08/27/2011   Joya Gaskins, MD 09/22/11 1410

## 2013-04-14 NOTE — L&D Delivery Note (Signed)
Attendance at C-section:  I was asked by Dr. Marice Potter to attend this repeat C/S at 38 4/[redacted] weeks GA due to LGA. The mother is a G4P2 O pos, GBS negative with Type II DM. ROM 6 hours prior to delivery, fluid clear. Infant vigorous with good spontaneous cry and tone. No resuscitation needed other than drying and stimulating. Ap 8/9. Lungs clear to ausc in DR, no distress noted. To CN to care of Pediatrician.  Ferol Luz NNP-BC

## 2013-04-27 ENCOUNTER — Ambulatory Visit (INDEPENDENT_AMBULATORY_CARE_PROVIDER_SITE_OTHER): Payer: Medicaid Other

## 2013-04-27 DIAGNOSIS — N926 Irregular menstruation, unspecified: Secondary | ICD-10-CM

## 2013-04-27 DIAGNOSIS — Z3201 Encounter for pregnancy test, result positive: Secondary | ICD-10-CM

## 2013-04-27 LAB — POCT PREGNANCY, URINE: PREG TEST UR: POSITIVE — AB

## 2013-04-27 NOTE — Progress Notes (Signed)
Pt. In today after not having a period since November 25th. Pregnancy test positive. Pt. Would like to have care here. Pt. Has diabetes. Will schedule for high risk clinic. New OB labs and urine culture today.

## 2013-04-28 LAB — OBSTETRIC PANEL
ANTIBODY SCREEN: NEGATIVE
BASOS PCT: 0 % (ref 0–1)
Basophils Absolute: 0 10*3/uL (ref 0.0–0.1)
EOS ABS: 0 10*3/uL (ref 0.0–0.7)
Eosinophils Relative: 0 % (ref 0–5)
HEMATOCRIT: 42 % (ref 36.0–46.0)
HEMOGLOBIN: 14.2 g/dL (ref 12.0–15.0)
Hepatitis B Surface Ag: NEGATIVE
LYMPHS ABS: 2.5 10*3/uL (ref 0.7–4.0)
Lymphocytes Relative: 23 % (ref 12–46)
MCH: 30.3 pg (ref 26.0–34.0)
MCHC: 33.8 g/dL (ref 30.0–36.0)
MCV: 89.7 fL (ref 78.0–100.0)
MONO ABS: 0.6 10*3/uL (ref 0.1–1.0)
MONOS PCT: 6 % (ref 3–12)
Neutro Abs: 7.6 10*3/uL (ref 1.7–7.7)
Neutrophils Relative %: 71 % (ref 43–77)
Platelets: 270 10*3/uL (ref 150–400)
RBC: 4.68 MIL/uL (ref 3.87–5.11)
RDW: 14.3 % (ref 11.5–15.5)
RH TYPE: POSITIVE
Rubella: 4.17 Index — ABNORMAL HIGH (ref <0.90)
WBC: 10.8 10*3/uL — ABNORMAL HIGH (ref 4.0–10.5)

## 2013-04-28 LAB — PRESCRIPTION MONITORING PROFILE (19 PANEL)
AMPHETAMINE/METH: NEGATIVE ng/mL
BENZODIAZEPINE SCREEN, URINE: NEGATIVE ng/mL
BUPRENORPHINE, URINE: NEGATIVE ng/mL
Barbiturate Screen, Urine: NEGATIVE ng/mL
CANNABINOID SCRN UR: NEGATIVE ng/mL
Carisoprodol, Urine: NEGATIVE ng/mL
Cocaine Metabolites: NEGATIVE ng/mL
Creatinine, Urine: 123.73 mg/dL (ref >20.0)
Fentanyl, Ur: NEGATIVE ng/mL
MDMA URINE: NEGATIVE ng/mL
METHADONE SCREEN, URINE: NEGATIVE ng/mL
METHAQUALONE SCREEN (URINE): NEGATIVE ng/mL
Meperidine, Ur: NEGATIVE ng/mL
Nitrites, Initial: NEGATIVE ug/mL
OPIATE SCREEN, URINE: NEGATIVE ng/mL
Oxycodone Screen, Ur: NEGATIVE ng/mL
PHENCYCLIDINE, UR: NEGATIVE ng/mL
Propoxyphene: NEGATIVE ng/mL
TAPENTADOLUR: NEGATIVE ng/mL
Tramadol Scrn, Ur: NEGATIVE ng/mL
ZOLPIDEM, URINE: NEGATIVE ng/mL
pH, Initial: 5.8 pH (ref 4.5–8.9)

## 2013-04-28 LAB — HIV ANTIBODY (ROUTINE TESTING W REFLEX): HIV: NONREACTIVE

## 2013-04-29 LAB — HEMOGLOBINOPATHY EVALUATION
HEMOGLOBIN OTHER: 0 %
HGB A2 QUANT: 2.6 % (ref 2.2–3.2)
HGB A: 97.1 % (ref 96.8–97.8)
Hgb F Quant: 0.3 % (ref 0.0–2.0)
Hgb S Quant: 0 %

## 2013-04-30 LAB — CULTURE, OB URINE: Colony Count: >=100000

## 2013-05-01 ENCOUNTER — Telehealth: Payer: Self-pay | Admitting: Family Medicine

## 2013-05-01 DIAGNOSIS — O99810 Abnormal glucose complicating pregnancy: Secondary | ICD-10-CM

## 2013-05-01 DIAGNOSIS — O2341 Unspecified infection of urinary tract in pregnancy, first trimester: Secondary | ICD-10-CM

## 2013-05-01 MED ORDER — CEPHALEXIN 500 MG PO CAPS: 500.0000 mg | ORAL_CAPSULE | Freq: Four times a day (QID) | ORAL | Status: DC

## 2013-05-02 MED ORDER — CEPHALEXIN 500 MG PO CAPS: 500.0000 mg | ORAL_CAPSULE | Freq: Four times a day (QID) | ORAL | Status: DC

## 2013-05-02 NOTE — Telephone Encounter (Signed)
Called patient with pacific interpreter [phone] and patient's husband answered stating this was his cell phone and the patient was at home, gave number of [phone]- will call there. Called patient at that number and informed patient of results & medication available for pickup. Patient verbalized understanding and had no further questions

## 2013-05-03 ENCOUNTER — Other Ambulatory Visit: Payer: Self-pay | Admitting: General Practice

## 2013-05-03 ENCOUNTER — Telehealth: Payer: Self-pay | Admitting: General Practice

## 2013-05-03 DIAGNOSIS — O2341 Unspecified infection of urinary tract in pregnancy, first trimester: Secondary | ICD-10-CM

## 2013-05-03 MED ORDER — CEPHALEXIN 500 MG PO CAPS: 500.0000 mg | ORAL_CAPSULE | Freq: Four times a day (QID) | ORAL | Status: AC

## 2013-05-03 NOTE — Telephone Encounter (Signed)
Patient called to front office stating the medication for the UTI was not at her pharmacy. Per chart review, the keflex Rx was printed. Reordered med to send to pharmacy. Told patient medication would be there now to pickup and apologized for inconvenience. Patient verbalized understanding and had no further questions

## 2013-05-09 ENCOUNTER — Encounter: Payer: Self-pay | Admitting: Obstetrics & Gynecology

## 2013-05-09 ENCOUNTER — Ambulatory Visit (INDEPENDENT_AMBULATORY_CARE_PROVIDER_SITE_OTHER): Payer: Self-pay | Admitting: Obstetrics & Gynecology

## 2013-05-09 ENCOUNTER — Other Ambulatory Visit (HOSPITAL_COMMUNITY)
Admission: RE | Admit: 2013-05-09 | Discharge: 2013-05-09 | Disposition: A | Discharge status: Home / Self Care | Payer: Medicaid Other | Source: Ambulatory Visit | Attending: Obstetrics & Gynecology | Admitting: Obstetrics & Gynecology

## 2013-05-09 VITALS — BP 123/80 | Temp 97.2°F | Ht 62.0 in | Wt 145.6 lb

## 2013-05-09 DIAGNOSIS — O99810 Abnormal glucose complicating pregnancy: Secondary | ICD-10-CM

## 2013-05-09 DIAGNOSIS — O24319 Unspecified pre-existing diabetes mellitus in pregnancy, unspecified trimester: Secondary | ICD-10-CM

## 2013-05-09 DIAGNOSIS — Z789 Other specified health status: Secondary | ICD-10-CM | POA: Insufficient documentation

## 2013-05-09 DIAGNOSIS — N841 Polyp of cervix uteri: Secondary | ICD-10-CM | POA: Insufficient documentation

## 2013-05-09 DIAGNOSIS — O09529 Supervision of elderly multigravida, unspecified trimester: Secondary | ICD-10-CM

## 2013-05-09 DIAGNOSIS — O09219 Supervision of pregnancy with history of pre-term labor, unspecified trimester: Secondary | ICD-10-CM | POA: Insufficient documentation

## 2013-05-09 DIAGNOSIS — Z609 Problem related to social environment, unspecified: Secondary | ICD-10-CM

## 2013-05-09 DIAGNOSIS — O2341 Unspecified infection of urinary tract in pregnancy, first trimester: Secondary | ICD-10-CM

## 2013-05-09 DIAGNOSIS — O209 Hemorrhage in early pregnancy, unspecified: Secondary | ICD-10-CM | POA: Insufficient documentation

## 2013-05-09 DIAGNOSIS — O34219 Maternal care for unspecified type scar from previous cesarean delivery: Secondary | ICD-10-CM | POA: Insufficient documentation

## 2013-05-09 DIAGNOSIS — O24919 Unspecified diabetes mellitus in pregnancy, unspecified trimester: Secondary | ICD-10-CM

## 2013-05-09 DIAGNOSIS — E119 Type 2 diabetes mellitus without complications: Secondary | ICD-10-CM

## 2013-05-09 DIAGNOSIS — N390 Urinary tract infection, site not specified: Secondary | ICD-10-CM

## 2013-05-09 DIAGNOSIS — O2390 Unspecified genitourinary tract infection in pregnancy, unspecified trimester: Secondary | ICD-10-CM

## 2013-05-09 HISTORY — DX: Supervision of elderly multigravida, unspecified trimester: O09.529

## 2013-05-09 LAB — POCT URINALYSIS DIP (DEVICE)
BILIRUBIN URINE: NEGATIVE
GLUCOSE, UA: 500 mg/dL — AB
Ketones, ur: 40 mg/dL — AB
Leukocytes, UA: NEGATIVE
NITRITE: NEGATIVE
Protein, ur: NEGATIVE mg/dL
SPECIFIC GRAVITY, URINE: 1.01 (ref 1.005–1.030)
UROBILINOGEN UA: 0.2 mg/dL (ref 0.0–1.0)
pH: 5.5 (ref 5.0–8.0)

## 2013-05-09 LAB — GC/CHLAMYDIA PROBE AMP
CT Probe RNA: NEGATIVE
GC Probe RNA: NEGATIVE

## 2013-05-09 MED ORDER — GLYBURIDE 2.5 MG PO TABS: 2.5000 mg | ORAL_TABLET | Freq: Two times a day (BID) | ORAL | Status: DC

## 2013-05-09 NOTE — Patient Instructions (Signed)
Diabetes mellitus Tipo 1 o Tipo 2 durante el embarazo  (Type 1 or Type 2 Diabetes Mellitus During Pregnancy) La diabetes mellitus tipo 1, que en general se la llama simplemente diabetes, es una enfermedad prolongada (crnica). Ocurre cuando las clulas de los islotes pncreticos que producen la insulina (una hormona) se destruyen y no pueden producirla. En la diabetes gestacional, el pncreas no produce suficiente insulina (una hormona), las clulas son menos sensibles a la insulina que se produce (resistencia a la insulina), o ambos. La insulina es necesaria para movilizar los azcares de los alimentos a las clulas de los tejidos. Las clulas de los tejidos Cendant Corporationutilizan los azcares para Psychiatristobtener energa. La falta de insulina o la falta de una respuesta normal a la insulina hace que el exceso de azcar se acumule en la sangre en lugar de Customer service managerpenetrar en las clulas de los tejidos. Como resultado, se desarrollan niveles altos de azcar en la sangre (hiperglucemia).  Si se mantienen los niveles de Event organiserglucosa en sangre en un rango normal antes y Therapist, sportsdurante el Richlandembarazo, las mujeres pueden tener un embarazo saludable. Si no controla eBaybien los niveles de glucosa en Law[place]angre, puede haber riesgos para usted, para el beb que no ha nacido (feto), durante el Rogerstrabajo de parto y Arlingtonel parto, o en el beb recin nacido.  FACTORES DE RIESGO   Una persona est predispuesta a desarrollar diabetes tipo 1 si alguien en su familia padece la enfermedad y se expone a ciertos desencadenantes ambientales adicionales.  Tiene una mayor probabilidad de desarrollar diabetes tipo 2 si tiene antecedentes familiares de diabetes y tambin tiene uno o ms de los siguientes factores de riesgo:  Tener sobrepeso.  Tiene un estilo de vida inactivo.  Tiene una historia familiar de consumo consistente de alimentos con muchas caloras. SNTOMAS Los sntomas de la diabetes son:   Aumento de la sed (polidipsia).  Aumento de ganas de Geographical information systems officerorinar  (poliuria).  Aumento de ganas de orinar durante la noche (nicturia).  Prdida de peso. La prdida de peso que puede ser muy rpida.  Infecciones frecuentes y recurrentes.  Cansancio (fatiga).  Debilidad.  Cambios en la visin, como visin borrosa.  Olor a Water quality scientistfruta en el aliento.  Dolor abdominal.  Nuseas o vmitos. DIAGNSTICO  La diabetes se diagnostica cuando hay aumento de los niveles de glucosa en la [place]angre. El nivel de glucosa en la sangre puede controlarse en uno o ms de los siguientes anlisis de sangre:   Medicin de glucosa de sangre en [place]unas. No se le permitir comer durante al menos 8 horas antes de que se tome [place]una muestra de McGuire AFBsangre.  Anlisis al azar de glucosa en sangre. El nivel de glucosa en sangre se controla en cualquier momento del da sin importar el momento en que haya comido.  Anlisis de glucosa de sangre de hemoglobina A1c. Un anlisis de hemoglobina A1c proporciona informacin sobre el control de la glucosa en la sangre durante los ltimos 3 meses.  Prueba oral de tolerancia a la glucosa (SOG). La glucosa en la sangre se mide despus de no haber comido (ayuno) durante 1-3 horas y despus de beber una bebida que contiene glucosa. La prueba oral de tolerancia a la glucosa se Walt Disneyrealiza durante las semanas 24 a 28 del embarazo. TRATAMIENTO   Usted tendr que tomar medicamentos para la diabetes o insulina diariamente para Pharmacologistmantener los niveles de glucosa en sangre en el rango deseado.  Usted tendr que coincidir la dosis de insulina con el ejercicio y Soil scientistla eleccin  de alimentos saludables. El objetivo del tratamiento es mantener el nivel de glucosa en sangre a 60-99 mg/dl antes de la comida (preprandial), a la hora de acostarse y durante la noche durante el Max Meadowsembarazo. El objetivo del tratamiento es mantener el mayor nivel de azcar en sangre despus de la comida (glucosa postprandial) en 100-140 mg/dl.   INSTRUCCIONES PARA EL CUIDADO EN EL HOGAR   Haga controlar  su nivel de hemoglobina A1c dos veces al ao.  Realice el control diario de la glucosa en sangre segn las indicaciones de su mdico. Es comn realizar controles con frecuencia de la glucosa en sangre.  Supervise las cetonas en la orina cuando est enferma y segn las indicaciones de su mdico.  Tome su medicamento para la diabetes y la insulina segn las indicaciones de su mdico para Pharmacologistmantener su nivel de glucosa en sangre en su rango deseado.  Nunca se quede sin medicamento para la diabetes o sin insulina. Es necesario recibirla CarMaxtodos los das.  Ajuste la Jacobs Engineeringinsulina sobre la base de la ingesta de hidratos de carbono. Los hidratos de carbono pueden aumentar los niveles de glucosa en la sangre, pero deben incluirse en su dieta. Aportan vitaminas, minerales y Guyanafibra que son Neomia Dearuna parte esencial de una dieta saludable. Se encuentran en frutas, verduras, granos enteros, productos lcteos, legumbres y alimentos que contienen azcares aadidos.  Consuma alimentos saludables. Alterne 3 comidas con 3 colaciones.  Mantenga un aumento de peso saludable. El aumento del peso total vara de acuerdo con el ndice de masa corporal antes del embarazo [facility]  Lleve una tarjeta de alerta mdica o use un brazalete o medalla de alerta mdica.  Lleve consigo un refrigerio de 15 gramos de hidrato de carbonos en todo momento para Chief Operating Officercontrolar los niveles bajos de glucosa en sangre (hipoglucemia). Algunos ejemplos de colaciones de 15 gramos de hidratos de carbono son:  Tabletas de glucosa, 3 o 4.  Gel de glucosa, tubo de 15 gramos.  Pasas de uva, 2 cucharadas (24 gramos).  Caramelos de goma, 6.  Galletas de Linds Crossinganimales, 8.  Jugo de fruta, gaseosa comn, o leche baja en grasa, 4 onzas (120 ml).  Pastillas gomitas, 9.  Reconocer la hipoglucemia. Durante el embarazo la hipoglucemia se produce cuando hay niveles de glucosa en sangre de 60 mg/dl o menos. El riesgo de hipoglucemia aumenta durante el ayuno o saltearse  las comidas, durante o despus del ejercicio intenso y Sperrydurante el sueo. Los sntomas de hipoglucemia son:  Temblores o sacudidas.  Disminucin de capacidad de concentracin.  Sudoracin.  Aumento en el ritmo cardaco.  Dolor de cabeza.  M.D.C. HoldingsBoca seca.  Hambre.  Irritabilidad.  Ansiedad.  Sueo agitado.  Alteracin del habla o de la coordinacin.  Confusin.  Tratar la hipoglucemia rpidamente. Si usted est alerta y puede tragar con seguridad, siga la regla de 15:15:  Tome de 15 a 20 gramos de glucosa de accin rpida o hidratos de carbono. Las opciones de accin rpida son un gel de glucosa, tabletas de glucosa, o 4 onzas (120 ml) de jugo de frutas, soda regular, o leche baja en grasa.  Compruebe su nivel de glucosa en sangre 15 minutos despus de tomar la glucosa.  Tome de 15 a 20 gramos ms de glucosa si el nivel repetido de glucosa en la sangre todava es de 70 mg/dl o inferior.  Coma una comida o un refrigerio dentro de 1 hora una vez que los niveles de glucosa en la sangre vuelven a la normalidad.  Rachael CuretHaga  actividad fsica en por lo menos 30 minutos al da o como lo indique su mdico. Se recomienda diez minutos de actividad fsica cronometrados 30 minutos despus de cada comida para Chief Operating Officer los niveles de glucosa postprandial en [place].   Est alerta a la poliuria y polidipsia, que son los primeros signos de la hiperglucemia. El temprano conocimiento de la hiperglucemia permite un tratamiento oportuno. Controle la hiperglucemia segn le indic su mdico.  Ajuste su dosis de insulina y la ingesta de alimentos, segn sea necesario, si inicia un nuevo ejercicio o deporte.  Siga con su plan diario de enfermo en algn momento que no puede comer o beber como de costumbre.  Evite el tabaco y el alcohol.  Concurra regularmente a las visitas de control con el mdico.  Siga el consejo de su mdico respecto a antes del parto y a las actividades de visitas de control despus  del nacimiento (despus del parto) en lo referente a la planificacin de las comidas, al ejercicio, a los medicamentos, a las vitaminas, a los anlisis de [place], a otras pruebas mdicas y fsicas.  Cuide diariamente la piel y los pies. Examine su piel y sus pies diariamente para detectar cortes, moretones, enrojecimiento, problemas en las uas, sangrado, ampollas o llagas. Anualmente debe hacerse un examen de los pies por un especialista.  Cepllese los dientes y encas por lo menos dos veces al da y use hilo dental al menos una vez por da. Concurra regularmente a las visitas de control con el dentista.  Programe un examen de vista durante el primer trimestre de su embarazo o como lo indique su mdico.  Comparta su plan de control de diabetes en el trabajo o en la escuela.  Mantngase al da con las vacunas.  Aprenda a Dealer.  Obtenga educacin continuada y ayuda para la diabetes cuando sea necesario. SOLICITE ATENCIN MDICA SI:   No puede comer alimentos o beber por ms de 6 horas.  Tiene nuseas o ha vomitado durante ms de 6 horas.  Tiene un nivel de glucosa en sangre de 200 mg/dl y [employer].  Presenta algn cambio en el estado mental.  Tiene problemas de visin.  Sufre un dolor persistente de Training and development officer.  Siente dolor o molestias en el abdomen.  Desarrolla una enfermedad grave adicional.  Tiene diarrea durante ms de 6 horas.  Ha estado enferma o ha tenido fiebre durante un par [address] y no mejora. SOLICITE ATENCIN MDICA DE INMEDIATO SI:  Tiene dificultad para respirar.  Ya no siente los movimientos del beb.  Est sangrando o tiene flujo vaginal.  Comienza a tener contracciones o trabajo de [place] prematuro. ASEGRESE DE QUE:  Comprende estas instrucciones.  Controlar su enfermedad.  Solicitar ayuda de inmediato si no mejora o si empeora. Document Released: 12/24/2011 Document Revised: 03/17/2012 [facility] Patient Information 2014  [place], [place].

## 2013-05-09 NOTE — Progress Notes (Signed)
DIABETES: patient is preconception T2DM on glimeperide ,metformin, and not testing her glucose. New Medication RegimenDC glimeperide, continue Metformin BID, RX glyburide 2.5. Instructed patient in use of TrueTest glucometer. Dispensed glucometer, 1 box lancets, 2 box of test strips. Reviewed testing protocol. To Log all readings and to bring log and glucometer to all visits.

## 2013-05-09 NOTE — Progress Notes (Signed)
[facility]13 at 10 am. Patient advised to bring $3 copay to visit.  Fetal Echo scheduled with Dr. Elizebeth Brookingotton on May 7th at 10 am. Patient advised to bring $ 100 payment at time of visit.

## 2013-05-09 NOTE — Progress Notes (Signed)
P=102 initial prenatal visit  Pt reports spots of blood when urinating

## 2013-05-09 NOTE — Progress Notes (Signed)
Subjective:    Rachael Russo is a 38 y.o. [id number] at 5772w6d dated by LMP, being seen today for her first obstetrical visit.  Her obstetrical history is significant for Type II DM, AMA, previous cesarean section with subsequent VBAC/PTD at 36 weeks in 2005 and a 21 week delivery in 2008 secondary to chorioamnionitis/infection (not thought to be cervical incompetence).  Patient is also Spanish-speaking only, Spanish interpreter present for this encounter.  Patient was recently diagnosed with an E.coli UTI, she is still on her Keflex regimen.  Patient does intend to breast feed.  Pregnancy history in EPIC was reviewed, paper chart requested.  Patient reports occasional bleeding and spotting.  No other concerns.  Filed Vitals:   05/09/13 0827 05/09/13 0830  BP: 123/80   Temp: 97.2 F (36.2 C)   Height:  5\' 2"  (1.575 m)  Weight: 145 lb 9.6 oz (66.044 kg)     HISTORY: OB History  Gravida Para Term Preterm AB SAB TAB Ectopic Multiple Living  4 3 1 2  0     2    # Outcome Date GA Lbr Len/2nd Weight Sex Delivery Anes PTL Lv  4 CUR           3 PRE 11/14/06 7823w0d    VBAC        Comments: Infection, chorioamnionitis  2 PRE 05/12/03 8484w0d  5 lb 2 oz (2.325 kg) M VBAC EPI Y Y     Comments: Type II Diabetes  1 TRM 11/22/98 8262w0d  8 lb 14 oz (4.026 kg) M LTCS   Y     Comments: Chicken pox while pregnant     Past Medical History  Diagnosis Date  . Diabetes mellitus    Past Surgical History  Procedure Laterality Date  . Cesarean section    . Coronary artery bypass graft     Family History  Problem Relation Age of Onset  . Diabetes Mother      Exam    Uterus:   Bedside scan revealed 8.5 week fetus by CRL, +cardiac activity  Pelvic Exam:    Perineum: No Hemorrhoids, Normal Perineum   Vulva: normal   Vagina:  normal mucosa, bloodyl discharge   Cervix: multiparous appearance and 7 mm polypoid lesion noted protruding from the cervical canal with a narrow stalk   Adnexa: normal  adnexa and no mass, fullness, tenderness   Bony Pelvis: gynecoid  System: Breast:  normal appearance, no masses or tenderness   Skin: normal coloration and turgor, no rashes    Neurologic: oriented, normal   Extremities: normal strength, tone, and muscle mass   HEENT PERRLA and extra ocular movement intact   Mouth/Teeth mucous membranes moist, pharynx normal without lesions and dental hygiene good   Neck supple and no masses   Cardiovascular: regular rate and rhythm   Respiratory:  appears well, vitals normal, no respiratory distress, acyanotic, normal RR, chest clear, no wheezing, crepitations, rhonchi, normal symmetric air entry   Abdomen: soft, non-tender; bowel sounds normal; no masses,  no organomegaly   Urinary: urethral meatus normal    CERVICAL POLYPECTOMY NOTE Patient counseled about polypectomy; she was given a choice to proceed with the procedure or to defer postpartum.  Mild bleeding ntoed to be coming from the friable surface of the lesions.  Patient requested removal of lesion.  Ring forceps were used to grasp the polypoid lesion and the polypoid lesion was removed by twisting it off its base.  Tissue sent to pathology  for analysis.  Significant bleeding was noted and hemostasis was achieved using silver nitrate sticks and Monsel's solution.  The patient tolerated the procedure well. Post-procedure instructions were given to the patient. The patient is to call with heavy bleeding, fever greater than 100.4, foul smelling vaginal discharge or other concerns. Will follow up pathology results.    Assessment:    Pregnancy: [id number] Patient Active Problem List   Diagnosis Date Noted  . AMA (advanced maternal age) multigravida 35+ 05/09/2013  . Previous preterm delivery, antepartum 05/09/2013  . Previous cesarean section complicating pregnancy, antepartum  05/09/2013  . Language barrier, speaks Spanish only and requires interpreter 05/09/2013  . Bleeding in early pregnancy,  antepartum 05/09/2013  . UTI (urinary tract infection) in pregnancy in first trimester 05/01/2013  . Type II Diabetes complicating pregnancy, antepartum 10/15/2006  Problem list reviewed and updated.  Plan:  1. Type II Diabetes complicating pregnancy, antepartum Initial labs reviewed.  Will check CMET, TSH, HgA1C next week when she returns with baseline 24 hour urine collection Glimepiride discontinued and replaced with Glyburide 2.5 mg po bid, continue Metformin 500 mg po bid for now.  DM supplies given to patient, DM re-education done. Will check BS next week Fetal ECHO and Optho exam scheduled for patient Will need antenatal testing later in pregnancy, serial growths as per protocol for Class B DM   2. AMA (advanced maternal age) multigravida 35+ Genetic Screening discussed Quad Screen: to be ordered later. Ultrasound discussed; fetal survey: to be ordered later.  3. Previous preterm delivery, antepartum Counseled about 17P, patient wants to get this medication.  She filled out [facility] application, will administer between 16-36 weeks. Will obtain paper chart and review details of last two pregnancies.  4. Previous cesarean section complicating pregnancy, antepartum  Patient desires TOLAC, she was given spanish VBAC consent to review at home. Discussed risks of uterine rupture. She will sign consent at next visit.  5. UTI (urinary tract infection) in pregnancy in first trimester Patient will complete antibiotic regimen, will continue to monitor  6. Cervical polyp Will follow up surgical pathology  7. Bleeding in early pregnancy, antepartum Polyp removed, will follow up GC/Chlam cultures, precautions reviewed Pap to be done next week; there was too much bleeding after polypectomy to do this today (forgot to do it prior to polypectomy)  Continue prenatal vitamins.  Flu shot given today.  Routine obstetric precautions reviewed. Follow up in 1 week.   Rachael Russo,  M.D. 05/09/2013

## 2013-05-10 LAB — PROTEIN / CREATININE RATIO, URINE
CREATININE, URINE: 54.8 mg/dL
PROTEIN CREATININE RATIO: 0.09 (ref <0.15)
TOTAL PROTEIN, URINE: 5 mg/dL

## 2013-05-12 ENCOUNTER — Encounter: Payer: Self-pay | Admitting: *Deleted

## 2013-05-16 ENCOUNTER — Ambulatory Visit: Payer: Self-pay | Admitting: Home Health Services

## 2013-05-16 ENCOUNTER — Other Ambulatory Visit: Payer: Self-pay | Admitting: Obstetrics & Gynecology

## 2013-05-16 ENCOUNTER — Ambulatory Visit (INDEPENDENT_AMBULATORY_CARE_PROVIDER_SITE_OTHER): Payer: Self-pay | Admitting: Obstetrics & Gynecology

## 2013-05-16 VITALS — BP 135/85 | Temp 99.1°F | Wt 145.1 lb

## 2013-05-16 DIAGNOSIS — O24319 Unspecified pre-existing diabetes mellitus in pregnancy, unspecified trimester: Secondary | ICD-10-CM

## 2013-05-16 DIAGNOSIS — O09529 Supervision of elderly multigravida, unspecified trimester: Secondary | ICD-10-CM

## 2013-05-16 DIAGNOSIS — O09219 Supervision of pregnancy with history of pre-term labor, unspecified trimester: Secondary | ICD-10-CM

## 2013-05-16 DIAGNOSIS — O24919 Unspecified diabetes mellitus in pregnancy, unspecified trimester: Secondary | ICD-10-CM

## 2013-05-16 DIAGNOSIS — E119 Type 2 diabetes mellitus without complications: Secondary | ICD-10-CM

## 2013-05-16 LAB — POCT URINALYSIS DIP (DEVICE)
Bilirubin Urine: NEGATIVE
GLUCOSE, UA: 500 mg/dL — AB
Ketones, ur: 40 mg/dL — AB
Leukocytes, UA: NEGATIVE
NITRITE: NEGATIVE
PROTEIN: NEGATIVE mg/dL
SPECIFIC GRAVITY, URINE: 1.01 (ref 1.005–1.030)
UROBILINOGEN UA: 0.2 mg/dL (ref 0.0–1.0)
pH: 5.5 (ref 5.0–8.0)

## 2013-05-16 LAB — COMPREHENSIVE METABOLIC PANEL
ALBUMIN: 4.4 g/dL (ref 3.5–5.2)
ALK PHOS: 77 U/L (ref 39–117)
ALT: 12 U/L (ref 0–35)
AST: 13 U/L (ref 0–37)
BILIRUBIN TOTAL: 0.3 mg/dL (ref 0.2–1.2)
BUN: 11 mg/dL (ref 6–23)
CO2: 21 mEq/L (ref 19–32)
CREATININE: 0.53 mg/dL (ref 0.50–1.10)
Calcium: 10.5 mg/dL (ref 8.4–10.5)
Chloride: 103 mEq/L (ref 96–112)
GLUCOSE: 164 mg/dL — AB (ref 70–99)
Potassium: 4.2 mEq/L (ref 3.5–5.3)
Sodium: 137 mEq/L (ref 135–145)
Total Protein: 7.5 g/dL (ref 6.0–8.3)

## 2013-05-16 LAB — TSH: TSH: 0.287 u[IU]/mL — ABNORMAL LOW (ref 0.350–4.500)

## 2013-05-16 MED ORDER — GLYBURIDE 5 MG PO TABS
5.0000 mg | ORAL_TABLET | Freq: Two times a day (BID) | ORAL | Status: DC
Start: 2013-05-16 — End: 2013-05-16

## 2013-05-16 MED ORDER — GLYBURIDE 5 MG PO TABS: 5.0000 mg | ORAL_TABLET | Freq: Two times a day (BID) | ORAL | Status: DC

## 2013-05-16 NOTE — Progress Notes (Signed)
U/S scheduled 05/18/13 at 11 am. Will refer for genetic counseling at next visit.

## 2013-05-16 NOTE — Progress Notes (Signed)
P-100 

## 2013-05-16 NOTE — Progress Notes (Signed)
Fastings 187,191,178,182,140,145,176; 2 hr pp breakfast 201,199,178,170,139,147; 2 hr pp lunch 225,204,189,194,142,182; 2 hr after dinner 250,172,191,176,157,170.  Pt on metformin bid and glyburide 2.5 mg bid.  Will increase glyburide to 5 mg bid.  If still not in control, will change to insulin next week. Pt has questions about her diet so will refer to nutritionist.  TOLAC consent signed; hgb aic, TSH, CMP today.  Pap today--small amount of blood noted at os. US for dating and viability. Pt will need referral to genetics and offered 17P at 36 weeks

## 2013-05-16 NOTE — Addendum Note (Signed)
Addended by: Louanna RawAMPBELL, Linh Hedberg M on: 05/16/2013 01:31 PM   Modules accepted: Orders, Medications

## 2013-05-17 ENCOUNTER — Other Ambulatory Visit: Payer: Self-pay | Admitting: Obstetrics & Gynecology

## 2013-05-17 DIAGNOSIS — R7989 Other specified abnormal findings of blood chemistry: Secondary | ICD-10-CM

## 2013-05-17 LAB — PROTEIN, URINE, 24 HOUR
PROTEIN 24H UR: 99 mg/d (ref 50–100)
PROTEIN, URINE: 5 mg/dL

## 2013-05-17 LAB — CREATININE CLEARANCE, URINE, 24 HOUR
CREAT CLEAR: 139 mL/min — AB (ref 75–115)
CREATININE 24H UR: 1059 mg/d (ref 700–1800)
CREATININE, URINE: 53.6 mg/dL
CREATININE: 0.53 mg/dL (ref 0.50–1.10)

## 2013-05-17 NOTE — Progress Notes (Signed)
Low TSH.  Add Free T3 and Free T4

## 2013-05-18 ENCOUNTER — Ambulatory Visit (HOSPITAL_COMMUNITY)
Admission: RE | Admit: 2013-05-18 | Discharge: 2013-05-18 | Disposition: A | Discharge status: Home / Self Care | Payer: Medicaid Other | Source: Ambulatory Visit | Attending: Obstetrics & Gynecology | Admitting: Obstetrics & Gynecology

## 2013-05-18 DIAGNOSIS — Z3689 Encounter for other specified antenatal screening: Secondary | ICD-10-CM | POA: Insufficient documentation

## 2013-05-18 DIAGNOSIS — O09219 Supervision of pregnancy with history of pre-term labor, unspecified trimester: Secondary | ICD-10-CM

## 2013-05-18 DIAGNOSIS — O24319 Unspecified pre-existing diabetes mellitus in pregnancy, unspecified trimester: Secondary | ICD-10-CM

## 2013-05-18 DIAGNOSIS — O209 Hemorrhage in early pregnancy, unspecified: Secondary | ICD-10-CM | POA: Insufficient documentation

## 2013-05-18 DIAGNOSIS — O09529 Supervision of elderly multigravida, unspecified trimester: Secondary | ICD-10-CM

## 2013-05-18 LAB — T3, FREE: T3 FREE: 3.1 pg/mL (ref 2.3–4.2)

## 2013-05-18 LAB — T4, FREE: FREE T4: 1.2 ng/dL (ref 0.80–1.80)

## 2013-05-18 NOTE — Progress Notes (Signed)
Rachael Russo will add the Free T3 and Free T4 from the blood that was drawn from 05/16/13.

## 2013-05-19 ENCOUNTER — Ambulatory Visit (INDEPENDENT_AMBULATORY_CARE_PROVIDER_SITE_OTHER): Payer: Self-pay | Admitting: Home Health Services

## 2013-05-19 DIAGNOSIS — O24919 Unspecified diabetes mellitus in pregnancy, unspecified trimester: Secondary | ICD-10-CM

## 2013-05-19 NOTE — Progress Notes (Signed)
DIABETES Pt came in to have a retinal scan per diabetic care.   Image was taken and submitted to [facility] for reading.    Results will be available in 1-2 weeks.  Results will be given to PCP for review and to contact patient.  Rachael Russo  

## 2013-05-23 ENCOUNTER — Ambulatory Visit (INDEPENDENT_AMBULATORY_CARE_PROVIDER_SITE_OTHER): Payer: Self-pay | Admitting: Obstetrics & Gynecology

## 2013-05-23 ENCOUNTER — Encounter: Payer: Self-pay | Admitting: *Deleted

## 2013-05-23 ENCOUNTER — Encounter: Payer: Self-pay | Admitting: Obstetrics & Gynecology

## 2013-05-23 VITALS — BP 125/73 | Temp 97.4°F | Wt 145.3 lb

## 2013-05-23 DIAGNOSIS — Z609 Problem related to social environment, unspecified: Secondary | ICD-10-CM

## 2013-05-23 DIAGNOSIS — Z789 Other specified health status: Secondary | ICD-10-CM

## 2013-05-23 DIAGNOSIS — O09219 Supervision of pregnancy with history of pre-term labor, unspecified trimester: Secondary | ICD-10-CM

## 2013-05-23 DIAGNOSIS — O209 Hemorrhage in early pregnancy, unspecified: Secondary | ICD-10-CM

## 2013-05-23 DIAGNOSIS — E119 Type 2 diabetes mellitus without complications: Secondary | ICD-10-CM

## 2013-05-23 DIAGNOSIS — O09529 Supervision of elderly multigravida, unspecified trimester: Secondary | ICD-10-CM

## 2013-05-23 DIAGNOSIS — O34219 Maternal care for unspecified type scar from previous cesarean delivery: Secondary | ICD-10-CM

## 2013-05-23 DIAGNOSIS — O24319 Unspecified pre-existing diabetes mellitus in pregnancy, unspecified trimester: Secondary | ICD-10-CM

## 2013-05-23 DIAGNOSIS — O24919 Unspecified diabetes mellitus in pregnancy, unspecified trimester: Secondary | ICD-10-CM

## 2013-05-23 LAB — POCT URINALYSIS DIP (DEVICE)
Bilirubin Urine: NEGATIVE
Glucose, UA: 500 mg/dL — AB
Ketones, ur: 40 mg/dL — AB
Leukocytes, UA: NEGATIVE
Nitrite: NEGATIVE
Protein, ur: NEGATIVE mg/dL
Specific Gravity, Urine: 1.02 (ref 1.005–1.030)
Urobilinogen, UA: 0.2 mg/dL (ref 0.0–1.0)
pH: 6 (ref 5.0–8.0)

## 2013-05-23 NOTE — Progress Notes (Signed)
Pulse- 113  Vaginal discharge- pink-tinged with brownish color

## 2013-05-23 NOTE — Patient Instructions (Signed)
Regrese a la clinica cuando tenga su cita. Si tiene problemas o preguntas, llama a la clinica o vaya a la sala de emergencia al [facility].    

## 2013-05-23 NOTE — Progress Notes (Signed)
Patient is Spanish-speaking only, Spanish interpreter present for this encounter. Fastings 123-176; 2hr PP B 147-185; L 143-181; 143-170. On Glyburide 5 mg bid and Metformin 500 mg bid; will double dose and max out Glyburide at 10 mg po bid, Metformin 1000 mg po bid.  If sugars are not improved, will start insulin next week, patient is agreeable to this plan.  Continue to follow diet.  No other complaints or concerns.  Routine obstetric precautions reviewed. Patient to fill out [facility] application, needs to start 17P at 16 weeks.

## 2013-05-23 NOTE — Progress Notes (Signed)
[facility] and faxed in.

## 2013-05-30 ENCOUNTER — Ambulatory Visit (INDEPENDENT_AMBULATORY_CARE_PROVIDER_SITE_OTHER): Payer: Self-pay | Admitting: Family Medicine

## 2013-05-30 ENCOUNTER — Encounter: Payer: Self-pay | Admitting: Family Medicine

## 2013-05-30 VITALS — BP 122/74 | Wt 146.2 lb

## 2013-05-30 DIAGNOSIS — O24319 Unspecified pre-existing diabetes mellitus in pregnancy, unspecified trimester: Secondary | ICD-10-CM

## 2013-05-30 DIAGNOSIS — O0990 Supervision of high risk pregnancy, unspecified, unspecified trimester: Secondary | ICD-10-CM | POA: Insufficient documentation

## 2013-05-30 DIAGNOSIS — E119 Type 2 diabetes mellitus without complications: Secondary | ICD-10-CM

## 2013-05-30 DIAGNOSIS — O24919 Unspecified diabetes mellitus in pregnancy, unspecified trimester: Secondary | ICD-10-CM

## 2013-05-30 LAB — POCT URINALYSIS DIP (DEVICE)
Bilirubin Urine: NEGATIVE
GLUCOSE, UA: 500 mg/dL — AB
Hgb urine dipstick: NEGATIVE
Ketones, ur: 15 mg/dL — AB
Leukocytes, UA: NEGATIVE
Nitrite: NEGATIVE
Protein, ur: NEGATIVE mg/dL
Specific Gravity, Urine: 1.02 (ref 1.005–1.030)
UROBILINOGEN UA: 0.2 mg/dL (ref 0.0–1.0)
pH: 6.5 (ref 5.0–8.0)

## 2013-05-30 MED ORDER — INSULIN NPH (HUMAN) (ISOPHANE) 100 UNIT/ML ~~LOC~~ SUSP: 8.0000 [IU] | SUBCUTANEOUS | Status: DC

## 2013-05-30 MED ORDER — INSULIN REGULAR HUMAN 100 UNIT/ML IJ SOLN: 8.0000 [IU] | Freq: Two times a day (BID) | INTRAMUSCULAR | Status: DC

## 2013-05-30 NOTE — Progress Notes (Signed)
DIABETES: T2DM , previous pregnancy required insulin use. Present FBS range 84-152, 2hpp 115-180. Reviewed testing procedures and insulin administration. Patient demonstrated compentency via Teach Back. Reviewed dietary restrictions. .   States why dietary management is important in controlling blood glucose  Describes the effects of carbohydrates on blood glucose levels  Demonstrates ability to create a balanced meal plan  Demonstrates carbohydrate counting   States when to check blood glucose levels  Demonstrates proper blood glucose monitoring techniques  States the effect of stress and exercise on blood glucose levels  Plan:  Aim for 2 Carb Choices per meal (30 grams) +/- 1 either way for breakfast Aim for 3 Carb Choices per meal (45 grams) +/- 1 either way from lunch and dinner Aim for 1-2 Carbs per snack Consider  increasing your activity level by walking daily as tolerated Begin checking BG before breakfast and 1-2 hours after first bit of breakfast, lunch and dinner after  as directed by MD  Take medication  as directed by MD  Patient instructed to monitor glucose levels: FBS: 60 - <90 2 hour: <120  Patient will be seen for follow-up as needed.

## 2013-05-30 NOTE — Progress Notes (Signed)
FBS 84-152 2 hr pp 115-184 Slight improvement only on max dose of oral medication Begin weight based insulin today NPH and Regular due to pt's insurance status. Reports spotting, no FHR heard--positive on u/s-1 cm dilated.  Threatened ab.--precautions given

## 2013-05-30 NOTE — Progress Notes (Signed)
Pulse 96 Pt reports that she has had some spotting a few times.

## 2013-06-06 ENCOUNTER — Encounter: Payer: Self-pay | Admitting: Family Medicine

## 2013-06-06 ENCOUNTER — Ambulatory Visit (INDEPENDENT_AMBULATORY_CARE_PROVIDER_SITE_OTHER): Payer: Self-pay | Admitting: Family Medicine

## 2013-06-06 VITALS — BP 127/76 | Temp 97.8°F | Wt 148.5 lb

## 2013-06-06 DIAGNOSIS — O0990 Supervision of high risk pregnancy, unspecified, unspecified trimester: Secondary | ICD-10-CM

## 2013-06-06 DIAGNOSIS — O24319 Unspecified pre-existing diabetes mellitus in pregnancy, unspecified trimester: Secondary | ICD-10-CM

## 2013-06-06 DIAGNOSIS — O09529 Supervision of elderly multigravida, unspecified trimester: Secondary | ICD-10-CM

## 2013-06-06 DIAGNOSIS — O34219 Maternal care for unspecified type scar from previous cesarean delivery: Secondary | ICD-10-CM

## 2013-06-06 DIAGNOSIS — E119 Type 2 diabetes mellitus without complications: Secondary | ICD-10-CM

## 2013-06-06 DIAGNOSIS — O24919 Unspecified diabetes mellitus in pregnancy, unspecified trimester: Secondary | ICD-10-CM

## 2013-06-06 DIAGNOSIS — O09219 Supervision of pregnancy with history of pre-term labor, unspecified trimester: Secondary | ICD-10-CM

## 2013-06-06 DIAGNOSIS — O3680X0 Pregnancy with inconclusive fetal viability, not applicable or unspecified: Secondary | ICD-10-CM

## 2013-06-06 LAB — POCT URINALYSIS DIP (DEVICE)
Bilirubin Urine: NEGATIVE
GLUCOSE, UA: 250 mg/dL — AB
Hgb urine dipstick: NEGATIVE
Ketones, ur: NEGATIVE mg/dL
Leukocytes, UA: NEGATIVE
NITRITE: NEGATIVE
Protein, ur: NEGATIVE mg/dL
Specific Gravity, Urine: 1.02 (ref 1.005–1.030)
Urobilinogen, UA: 0.2 mg/dL (ref 0.0–1.0)
pH: 7 (ref 5.0–8.0)

## 2013-06-06 LAB — US OB COMP LESS 14 WKS

## 2013-06-06 NOTE — Progress Notes (Signed)
S: 38 yo [id number] @ 265w6d here for ROBV - has been bleeding and at last visit was told she had a threatened AB.  Has stopped bleeding now for the last week - no cramping.   - T2DM     Has been on NPH 20U qAM and 8U qPM and regular 12U in AM and 8U in the PM  - started last week on Wednesday      Fasting- 99-116     Pp- 112-150  O: see flowhseet  A/P Type 2 DM    - increase NPH to 10U qpm and 22u qAM    - regular- stays the same    - come back in 1 week  FWB - US today to confirm HT due to hx of bleeding and threatened AB - FHT 156 with movement - reassurance given.

## 2013-06-06 NOTE — Progress Notes (Signed)
Bedside US to confirm viability - FHR = 156 per PW doppler.  FM present.  Dr. Reola CalkinsBeck notified of results.

## 2013-06-06 NOTE — Progress Notes (Signed)
p-97 

## 2013-06-06 NOTE — Patient Instructions (Signed)
Embarazo  Primer trimestre  (Pregnancy - First Trimester)  Durante el acto sexual, millones de espermatozoides entran en la vagina. Slo 1 espermatozoide penetra y fertiliza al vulo mientras se encuentra en la trompa de [place]. Una semana ms tarde, el vulo fertilizado se implanta en la pared del tero. Un embrin comienza a desarrollarse para ser un beb. A las 6 a 8 semanas se forman los ojos y la cara y los latidos del corazn se pueden ver en la ecografa. Al final de las 12 semanas (primer trimestre) todos los rganos del beb estn formados. Ahora que est embarazada, querr hacer todo lo que est a su alcance para tener un beb sano. Dos de las cosas ms importantes son: tener una buena atencin prenatal y seguir las indicaciones del profesional que la asiste. La atencin prenatal incluye toda la asistencia mdica que usted recibe antes del nacimiento del beb. Se lleva a cabo para prevenir y tratar problemas durante el embarazo y el parto. EXAMENES PRENATALES   Durante las visitas prenatales se controlan el peso, la presin arterial y se solicitan anlisis de orina. Esto se hace para asegurarse de que usted est sana y el embarazo progrese normalmente.  Una mujer embarazada debe aumentar de 25 a 35 libras durante el embarazo. Sin embargo, si usted tiene sobrepeso o bajo peso, su mdico le aconsejar qu hacer.  El podr hacerle preguntas y responder todas las que usted le haga.  Durante los exmenes prenatales se solicitan anlisis de sangre, cultivos del tero, un [place] y otros anlisis necesarios. Estas pruebas se realizan para controlar su salud y la del beb. Se recomienda que se haga la prueba para el diagnstico del VIH, con su autorizacin. Este es el virus que causa el SIDA. Estas pruebas se realizan porque existen medicamentos que podran administrarle para prevenir que el beb nazca con esta infeccin, si usted estuviera infectada y no lo supiera. Los anlisis de sangre  tambin se realizan para determinar el tipo de sangre, si tuvo infecciones previas y controlar sus niveles en la sangre (hemoglobina).  Tener un recuento bajo de hemoglobina (anemia) es comn durante el embarazo. Para prevenirla, se administran hierro y vitaminas. En una etapa ms avanzada del embarazo, le indicarn exmenes de sangre para saber si tiene diabetes, junto con otros anlisis, en caso de que tuviera problemas.  Es necesario que se haga las pruebas para asegurarse de que usted y el beb estn bien. CAMBIOS DURANTE EL PRIMER TRIMESTRE  Su organismo atravesar numerosos cambios durante el embarazo. Estos pueden variar de una persona a otra. Converse con el mdico acerca los cambios que usted nota y que la preocupan. Ellos son:   El perodo menstrual se detiene.  El vulo y los espermatozoides llevan los genes que determinan cmo seremos. Sus genes y los de su pareja forman el beb. Los genes del varn determinan si ser un nio o una nia.  La circunferencia de la cintura va a ir aumentando y podr sentirse hinchada.  Puede tener malestar estomacal (nuseas) y vmitos. Si no puede controlar los vmitos, consulte a su mdico.  Sus mamas comenzarn a agrandarse y sensibilizarse.  Los pezones pueden sobresalir ms y ser ms oscuros.  Tendr necesidad de orinar ms. El dolor al orinar puede significar que usted tiene una infeccin de la vejiga.  Se cansar con facilidad.  Prdida del apetito.  Sentir un fuerte deseo de consumir ciertos alimentos.  Al principio, usted puede ganar o perder un par de kilos.    Podr tener cambios emocionales de un da a otro (entusiasmo por estar embarazada o preocupacin por el embarazo y el beb).  Tendr sueos ms vvidos y extraos. INSTRUCCIONES PARA EL CUIDADO EN EL HOGAR   Es muy importante evitar el cigarrillo, el alcohol y los frmacos no recetados durante el embarazo. Estas sustancias afectan la formacin y el desarrollo del beb.  Evite los productos qumicos durante el embarazo para asegurar la salud del beb.  Comience las consultas prenatales alrededor de la 12 semana de embarazo. Generalmente se programan cada mes al principio y se hacen ms frecuentes en los 2 ltimos meses antes del parto. Cumpla con las citas de control. Siga las indicaciones del mdico con respecto al uso de medicamentos, los anlisis y pruebas de laboratorio, los ejercicios y la dieta.  Durante el embarazo debe obtener nutrientes para usted y para su beb. Consuma alimentos balanceados. Elija alimentos como carne, pescado, leche y otros productos lcteos descremados, vegetales, frutas, panes integrales y cereales. El mdico le informar cul es el aumento de peso ideal.  Las nuseas matinales pueden aliviarse si come algunas galletitas saladas en la cama. Coma dos galletitas antes de levantarse por la maana. Tambin puede comer galletitas sin sal.  Hacer 4 o 5 comidas pequeas en lugar de 3 comidas grandes por da tambin puede aliviar las nuseas y los vmitos.  Beber lquidos entre las comidas en lugar de tomarlos durante las comidas tambin puede ayudar a calmar las nuseas y los vmitos.  Puede continuar teniendo relaciones sexuales durante todo el embarazo si no hay otros problemas. Los problemas pueden ser una prdida precoz (prematura) de lquido amnitico, sangrado vaginal, o dolor en el vientre (abdominal).  Realice actividad fsica todos los das, si no tiene restricciones. Consulte con su mdico o terapeuta fsico si no est segura de algunos de sus ejercicios. El mayor aumento de peso se producir en los ltimos 2 trimestres del embarazo. El ejercicio le ayudar a:  Controlar su peso.  Mantenerse en forma.  Prepararse para el parto.  La ayudar a perder el peso del embarazo despus de que nazca su beb.  Use un buen sostn o como los que se usan para hacer deportes para aliviar la sensibilidad de las mamas. Tambin puede serle  til si lo usa mientras duerme.  Consulte cuando puede comenzar con las clases de pre parto. Comience con las clases cuando estn disponibles.  No utilice la baera con agua caliente, baos turcos y saunas.   Colquese el cinturn de seguridad cuando conduzca. Este la proteger a usted y al beb en caso de accidente.  Evite comer carne cruda y el contacto con los utensilios y desperdicios de los gatos. Estos elementos contienen grmenes que pueden causar defectos de nacimiento en el beb.  El primer trimestre es un buen momento para visitar a su dentista y evaluar su salud dental. Es importante mantener los dientes limpios. Use un cepillo de dientes suave y cepllese con ms suavidad durante el embarazo.  Pida ayuda si tienen necesidades financieras, teraputicas o nutricionales. El profesional podr ayudarla con respecto a estas necesidades, o derivarla a otros especialistas.  No tome medicamentos o hierbas excepto aquellos que le indic el profesional.  Informe a su mdico si sufre violencia familiar mental o fsica.  Haga una lista de nmeros de telfono de emergencia de la familia, los amigos, el hospital y los departamentos de polica y bomberos.  Escriba sus preguntas. Llvelas cuando concurra a su visita prenatal.  No se   haga duchas vaginales.  No cruce las piernas.  Si usted tiene que estar parada por largos perodos de tiempo, gire los pies o de pequeos pasos en crculo.  Es posible que tenga ms secreciones vaginales que puedan requerir una toalla higinica. No use tampones o toallas higinicas perfumadas. EL CONSUMO DE MEDICAMENTOS Y FRMACOS DURANTE EL EMBARAZO   Tome las vitaminas para la etapa prenatal tal como se le indic. Las vitaminas deben contener un miligramo de cido flico. Guarde todas las vitaminas fuera del alcance de los nios. La ingestin de slo un par de vitaminas o tabletas que contengan hierro pueden ocasionar la muerte en un beb o en un nio  pequeo.  Evite el uso de todos los medicamentos, incluyendo hierbas, medicamentos de venta libre, sin receta o que no hayan sido sugeridos por su mdico. Slo tome medicamentos de venta libre o medicamentos recetados para el dolor, el malestar o fiebre como lo indique su mdico. No tome aspirina, ibuprofeno o naproxeno excepto que su mdico se lo indique.  Infrmele al profesional si consume medicamentos de hierbas.  El alcohol se relaciona con ciertos defectos congnitos. Incluye el sndrome de alcoholismo fetal. Debe evitar absolutamente el consumo de alcohol, en cualquier forma. El fumar causar baja tasa de natalidad y bebs prematuros.  Las drogas ilegales o de la calle son muy perjudiciales para el beb. Estn absolutamente prohibidas. Un beb que nace de una madre adicta, ser adicto al nacer. Ese beb tendr los mismos sntomas de abstinencia que un adulto.  Informe a su mdico acerca de los medicamentos que ha tomado y el motivo por el que los tom. SOLICITE ATENCIN MDICA SI:  Tiene preguntas o preocupaciones relacionadas con el embarazo. Es mejor que llame para formular las preguntas si no puede esperar hasta la prxima visita, que sentirse preocupada por ellas.  SOLICITE ATENCIN MDICA DE INMEDIATO SI:   La temperatura oral le sube a ms de 38,9 C (102 F) o lo que su mdico le indique.  Tiene una prdida de lquido por la vagina (canal de parto). Si sospecha una ruptura de las membranas, tmese la temperatura y llame al profesional para informarlo sobre esto.  Observa unas pequeas manchas o una hemorragia vaginal. Notifique al profesional acerca de la cantidad y de cuntos apsitos est utilizando.  Presenta un olor desagradable en la secrecin vaginal y observa un cambio en el color.  Contina con las nuseas y no obtiene alivio de los remedios indicados. Vomita sangre o algo similar a la borra del caf.  Pierde ms de 2 libras (1 Kg) en una semana.  Aumenta ms de 1 Kg  en una semana y nota el rostro, las manos, los pies o las piernas hinchados.  Aumenta ms de 2,5 Kg en una semana (aunque no tenga las manos, pies, piernas o el rostro hinchados).  Ha estado expuesta a la rubola y no ha sufrido la enfermedad.  Ha estado expuesta a la quinta enfermedad o a la varicela.  Siente dolor en el vientre (abdominal). Las molestias en el ligamento redondo son una causa benigna frecuente de dolor abdominal durante el embarazo. El profesional que la asiste deber evaluarla.  Presenta dolor de cabeza, fiebre, diarrea, dolor al orinar o le falta la respiracin.  Se cae, se ve involucrada en un accidente automovilstico o sufre algn tipo de traumatismo.  En su hogar hay violencia mental o fsica. Document Released: 01/08/2005 Document Revised: 12/24/2011 [employer] Patient Information 2014 [employer].  

## 2013-06-13 ENCOUNTER — Ambulatory Visit (INDEPENDENT_AMBULATORY_CARE_PROVIDER_SITE_OTHER): Payer: Self-pay | Admitting: Obstetrics & Gynecology

## 2013-06-13 ENCOUNTER — Encounter: Payer: Self-pay | Admitting: Obstetrics & Gynecology

## 2013-06-13 VITALS — BP 122/81 | Temp 96.9°F | Wt 150.5 lb

## 2013-06-13 DIAGNOSIS — E119 Type 2 diabetes mellitus without complications: Secondary | ICD-10-CM

## 2013-06-13 DIAGNOSIS — O24919 Unspecified diabetes mellitus in pregnancy, unspecified trimester: Secondary | ICD-10-CM

## 2013-06-13 DIAGNOSIS — O24319 Unspecified pre-existing diabetes mellitus in pregnancy, unspecified trimester: Secondary | ICD-10-CM

## 2013-06-13 LAB — POCT URINALYSIS DIP (DEVICE)
Bilirubin Urine: NEGATIVE
GLUCOSE, UA: 500 mg/dL — AB
Hgb urine dipstick: NEGATIVE
LEUKOCYTES UA: NEGATIVE
Nitrite: POSITIVE — AB
PROTEIN: NEGATIVE mg/dL
Specific Gravity, Urine: 1.02 (ref 1.005–1.030)
Urobilinogen, UA: 0.2 mg/dL (ref 0.0–1.0)
pH: 7 (ref 5.0–8.0)

## 2013-06-13 MED ORDER — INSULIN NPH (HUMAN) (ISOPHANE) 100 UNIT/ML ~~LOC~~ SUSP: SUBCUTANEOUS | Status: DC

## 2013-06-13 NOTE — Progress Notes (Signed)
FBS 87-98, PP 96-127, Will increase NPH HS to 14 units. Urine culture today. Will do Quad screen next, 17 P 16 weeks

## 2013-06-13 NOTE — Progress Notes (Signed)
Pulse: 104

## 2013-06-13 NOTE — Patient Instructions (Signed)
Segundo trimestre del embarazo  (Second Trimester of Pregnancy) El segundo trimestre del embarazo se extiende desde la semana 13 hasta la semana 28, del 4 al 6 mes. En general, es el momento del embarazo en el que se sentir mejor. Su organismo se ha adaptado a estar embarazada y comienza a sentirse fsicamente mejor. En general las nuseas matutinas han disminuido o han desaparecido completamente. El segundo trimestre es tambin la poca en la que el feto se desarrolla rpidamente. Hacia el final del sexto mes, el beb mide aproximadamente 9 pulgadas (23 cm) y pesa alrededor de 1  libras (700 g). Es probable que sienta mover al beb (dar pataditas) entre las 18 y 20 semanas del embarazo.  CAMBIOS CORPORALES  Su organismo atravesar numerosos cambios durante el embarazo. Los cambios varan de una mujer a otra.   Seguir aumentando de peso. Notar que la parte baja del abdomen se ensancha.  Podrn aparecer las primeras estras en las caderas, abdomen y mamas.  Es posible que sienta cefaleas, que se pueden aliviar con los medicamentos que su mdico le autorice a utilizar.  Tendr necesidad de orinar con ms frecuencia porque el feto est presionando en la vejiga.  Como consecuencia del embarazo, podr sentir acidez estomacal continuamente.  Podr estar constipada ya que ciertas hormonas hacen que los msculos que empujan los desechos a travs de los intestinos trabajen ms lentamente.  Pueden aparecer hemorroides o abultarse las venas (venas varicosas).  El dolor de espalda se debe al aumento de peso y a que las hormonas del embarazo relajan las articulaciones entre los huesos de la pelvis y a la modificacin del peso y a los msculos que soportan el equilibrio.  Sus mamas seguirn desarrollndose y estarn ms sensibles.  Las encas pueden sangrar y estar sensibles al cepillado y al hilo dental.  Pueden aparecer en el rostro zonas oscuras o manchas (cloasma, mscara del embarazo). Esto  desaparece despus del nacimiento del beb.  Es posible que se formeuna lnea oscura desde el ombligo a la zona del pubis (linea nigra). Esto desaparece despus del nacimiento del beb. QU DEBE ESPERAR EN LAS CONSULTAS PRENATALES  Durante una visita prenatal de rutina:   La pesarn para verificar que usted y el feto se encuentran dentro de los lmites normales.  Le tomarn la presin arterial.  Le medirn el abdomen para verificar el desarrollo del beb.  Escucharn los latidos fetales.  Se evaluarn los resultados de los estudios realizados en visitas anteriores. El mdico le preguntar:   Cmo se siente.  Si siente los movimientos del beb.  Si tiene sntomas anormales, como prdida de lquido, sangrado, dolores de cabeza intensos o clicos abdominales.  Si tiene alguna duda. Otros estudios que podrn realizarse durante el segundo trimestre son:   Anlisis de sangre para evaluar:  Niveles bajos de hierro (anemia).  Diabetes gestacional (entre las 24 y las 28 semanas).  Anticuerpos Rh.  Anlisis de orina para detectar infecciones, diabetes o protenas en la orina.  Una ecografa para confirmar si el crecimiento y el desarrollo del beb son los adecuados.  Una amniocentesis para diagnosticar posibles problemas genticos.  Estudios del feto para descartar espina bfida y sndrome de [place]. INSTRUCCIONES PARA EL CUIDADO EN EL HOGAR   Evite fumar, consumir hierbas, beber alcohol y utilizar frmacos que no ne hayan recetado. Estas sustancias qumicas afectan la formacin y el desarrollo del beb.  Siga las indicaciones del profesional con respecto a como tomar los medicamentos. Durante el embarazo, hay   medicamentos que son seguros y otros no lo son.  Realice actividad fsica slo segn las indicaciones del mdico. Sentir clicos uterinos es el mejor signo para detener la actividad fsica.  Contine haciendo comidas regulares y sanas.  Use un sostn que le brinde buen  soporte si sus mamas estn sensibles.  No utilice la baera con agua caliente, baos turcos y saunas.  Colquese el cinturn de seguridad cuando conduzca.  Evite comer carne cruda y el contacto con los utensilios y desperdicios de los gatos. Estos elementos contienen grmenes que pueden causar defectos de nacimiento en el beb.  Tome las vitaminas indicadas para la etapa prenatal.  Pruebe un laxante (si el mdico la autoriza) si tiene constipacin. Consuma ms alimentos ricos en fibra, como vegetales y frutas frescos y cereales enteros. Beba gran cantidad de lquido para mantener la orina de tono claro o amarillo plido.  Tome baos de agua tibia para calmar el dolor o las molestias causadas por las hemorroides. Use una crema para las hemorroides si el mdico la autoriza.  Si tiene venas varicosas, use medias de soporte. Eleve los pies durante 15 minutos, 3 o 4 veces por da. Limite el consumo de sal en su dieta.  Evite levantar objetos pesados, use zapatos de tacones bajos y mantenga una buena postura.  Descanse con las piernas elevadas si tiene calambres o dolor de cintura.  Visite a su dentista si no lo ha hecho durante el embarazo. Use un cepillo de dientes blando para higienizarse los dientes y use suavemente el hilo dental.  Puede continuar su vida sexual siempre que el mdico la autorice.  Concurra a todas las visitas prenatales segn las indicaciones de su mdico. SOLICITE ATENCIN MDICA SI:   Tiene mareos.  Siente clicos leves, presin en la pelvis o dolor persistente en el abdomen.  Tiene nuseas o vmitos o diarrea persistentes.  Observa una secrecin vaginal con mal olor.  Siente dolor al orinar. SOLICITE ATENCIN MDICA DE INMEDIATO SI:   Tiene fiebre.  Pierde lquido por la vagina.  Tiene sangrando o pequeas prdidas vaginales.  Siente dolor intenso o clicos en el abdomen.  Sube o baja de peso rpidamente.  Tiene dificultad para respirar y le duele  pecho.  Sbitamente se le hincha el rostro, las manos, los tobillos, los pies o las piernas de manera extrema.  No ha sentido los movimientos del beb durante una hora.  Siente un dolor de cabeza intenso que no se alivia con medicamentos.  Su visin se modifica. Document Released: 01/08/2005 Document Revised: 12/01/2012 [employer] Patient Information 2014 [employer].  

## 2013-06-27 ENCOUNTER — Ambulatory Visit (INDEPENDENT_AMBULATORY_CARE_PROVIDER_SITE_OTHER): Payer: Self-pay | Admitting: Family Medicine

## 2013-06-27 ENCOUNTER — Encounter: Payer: Self-pay | Admitting: Family Medicine

## 2013-06-27 VITALS — BP 135/79 | Temp 97.9°F | Wt 152.2 lb

## 2013-06-27 DIAGNOSIS — O09219 Supervision of pregnancy with history of pre-term labor, unspecified trimester: Secondary | ICD-10-CM

## 2013-06-27 DIAGNOSIS — Z789 Other specified health status: Secondary | ICD-10-CM

## 2013-06-27 DIAGNOSIS — O34219 Maternal care for unspecified type scar from previous cesarean delivery: Secondary | ICD-10-CM

## 2013-06-27 DIAGNOSIS — O0990 Supervision of high risk pregnancy, unspecified, unspecified trimester: Secondary | ICD-10-CM

## 2013-06-27 DIAGNOSIS — O09529 Supervision of elderly multigravida, unspecified trimester: Secondary | ICD-10-CM

## 2013-06-27 DIAGNOSIS — O24919 Unspecified diabetes mellitus in pregnancy, unspecified trimester: Secondary | ICD-10-CM

## 2013-06-27 DIAGNOSIS — Z609 Problem related to social environment, unspecified: Secondary | ICD-10-CM

## 2013-06-27 DIAGNOSIS — O24319 Unspecified pre-existing diabetes mellitus in pregnancy, unspecified trimester: Secondary | ICD-10-CM

## 2013-06-27 DIAGNOSIS — E119 Type 2 diabetes mellitus without complications: Secondary | ICD-10-CM

## 2013-06-27 LAB — POCT URINALYSIS DIP (DEVICE)
BILIRUBIN URINE: NEGATIVE
Hgb urine dipstick: NEGATIVE
Ketones, ur: NEGATIVE mg/dL
Leukocytes, UA: NEGATIVE
Nitrite: NEGATIVE
Protein, ur: NEGATIVE mg/dL
SPECIFIC GRAVITY, URINE: 1.02 (ref 1.005–1.030)
UROBILINOGEN UA: 0.2 mg/dL (ref 0.0–1.0)
pH: 6 (ref 5.0–8.0)

## 2013-06-27 MED ORDER — HYDROXYPROGESTERONE CAPROATE 250 MG/ML IM OIL
250.0000 mg | TOPICAL_OIL | INTRAMUSCULAR | Status: AC
  Administered 2013-06-27 – 2013-11-07 (×20): 250 mg via INTRAMUSCULAR

## 2013-06-27 NOTE — Patient Instructions (Signed)
Segundo trimestre del embarazo  (Second Trimester of Pregnancy) El segundo trimestre del embarazo se extiende desde la semana 13 hasta la semana 28, del 4 al 6 mes. En general, es el momento del embarazo en el que se sentir mejor. Su organismo se ha adaptado a estar embarazada y comienza a sentirse fsicamente mejor. En general las nuseas matutinas han disminuido o han desaparecido completamente. El segundo trimestre es tambin la poca en la que el feto se desarrolla rpidamente. Hacia el final del sexto mes, el beb mide aproximadamente 9 pulgadas (23 cm) y pesa alrededor de 1  libras (700 g). Es probable que sienta mover al beb (dar pataditas) entre las 18 y 20 semanas del embarazo.  CAMBIOS CORPORALES  Su organismo atravesar numerosos cambios durante el embarazo. Los cambios varan de una mujer a otra.   Seguir aumentando de peso. Notar que la parte baja del abdomen se ensancha.  Podrn aparecer las primeras estras en las caderas, abdomen y mamas.  Es posible que sienta cefaleas, que se pueden aliviar con los medicamentos que su mdico le autorice a utilizar.  Tendr necesidad de orinar con ms frecuencia porque el feto est presionando en la vejiga.  Como consecuencia del embarazo, podr sentir acidez estomacal continuamente.  Podr estar constipada ya que ciertas hormonas hacen que los msculos que empujan los desechos a travs de los intestinos trabajen ms lentamente.  Pueden aparecer hemorroides o abultarse las venas (venas varicosas).  El dolor de espalda se debe al aumento de peso y a que las hormonas del embarazo relajan las articulaciones entre los huesos de la pelvis y a la modificacin del peso y a los msculos que soportan el equilibrio.  Sus mamas seguirn desarrollndose y estarn ms sensibles.  Las encas pueden sangrar y estar sensibles al cepillado y al hilo dental.  Pueden aparecer en el rostro zonas oscuras o manchas (cloasma, mscara del embarazo). Esto  desaparece despus del nacimiento del beb.  Es posible que se formeuna lnea oscura desde el ombligo a la zona del pubis (linea nigra). Esto desaparece despus del nacimiento del beb. QU DEBE ESPERAR EN LAS CONSULTAS PRENATALES  Durante una visita prenatal de rutina:   La pesarn para verificar que usted y el feto se encuentran dentro de los lmites normales.  Le tomarn la presin arterial.  Le medirn el abdomen para verificar el desarrollo del beb.  Escucharn los latidos fetales.  Se evaluarn los resultados de los estudios realizados en visitas anteriores. El mdico le preguntar:   Cmo se siente.  Si siente los movimientos del beb.  Si tiene sntomas anormales, como prdida de lquido, sangrado, dolores de cabeza intensos o clicos abdominales.  Si tiene alguna duda. Otros estudios que podrn realizarse durante el segundo trimestre son:   Anlisis de sangre para evaluar:  Niveles bajos de hierro (anemia).  Diabetes gestacional (entre las 24 y las 28 semanas).  Anticuerpos Rh.  Anlisis de orina para detectar infecciones, diabetes o protenas en la orina.  Una ecografa para confirmar si el crecimiento y el desarrollo del beb son los adecuados.  Una amniocentesis para diagnosticar posibles problemas genticos.  Estudios del feto para descartar espina bfida y sndrome de [place]. INSTRUCCIONES PARA EL CUIDADO EN EL HOGAR   Evite fumar, consumir hierbas, beber alcohol y utilizar frmacos que no ne hayan recetado. Estas sustancias qumicas afectan la formacin y el desarrollo del beb.  Siga las indicaciones del profesional con respecto a como tomar los medicamentos. Durante el embarazo, hay   medicamentos que son seguros y otros no lo son.  Realice actividad fsica slo segn las indicaciones del mdico. Sentir clicos uterinos es el mejor signo para detener la actividad fsica.  Contine haciendo comidas regulares y sanas.  Use un sostn que le brinde buen  soporte si sus mamas estn sensibles.  No utilice la baera con agua caliente, baos turcos y saunas.  Colquese el cinturn de seguridad cuando conduzca.  Evite comer carne cruda y el contacto con los utensilios y desperdicios de los gatos. Estos elementos contienen grmenes que pueden causar defectos de nacimiento en el beb.  Tome las vitaminas indicadas para la etapa prenatal.  Pruebe un laxante (si el mdico la autoriza) si tiene constipacin. Consuma ms alimentos ricos en fibra, como vegetales y frutas frescos y cereales enteros. Beba gran cantidad de lquido para mantener la orina de tono claro o amarillo plido.  Tome baos de agua tibia para calmar el dolor o las molestias causadas por las hemorroides. Use una crema para las hemorroides si el mdico la autoriza.  Si tiene venas varicosas, use medias de soporte. Eleve los pies durante 15 minutos, 3 o 4 veces por da. Limite el consumo de sal en su dieta.  Evite levantar objetos pesados, use zapatos de tacones bajos y mantenga una buena postura.  Descanse con las piernas elevadas si tiene calambres o dolor de cintura.  Visite a su dentista si no lo ha hecho durante el embarazo. Use un cepillo de dientes blando para higienizarse los dientes y use suavemente el hilo dental.  Puede continuar su vida sexual siempre que el mdico la autorice.  Concurra a todas las visitas prenatales segn las indicaciones de su mdico. SOLICITE ATENCIN MDICA SI:   Tiene mareos.  Siente clicos leves, presin en la pelvis o dolor persistente en el abdomen.  Tiene nuseas o vmitos o diarrea persistentes.  Observa una secrecin vaginal con mal olor.  Siente dolor al orinar. SOLICITE ATENCIN MDICA DE INMEDIATO SI:   Tiene fiebre.  Pierde lquido por la vagina.  Tiene sangrando o pequeas prdidas vaginales.  Siente dolor intenso o clicos en el abdomen.  Sube o baja de peso rpidamente.  Tiene dificultad para respirar y le duele  pecho.  Sbitamente se le hincha el rostro, las manos, los tobillos, los pies o las piernas de manera extrema.  No ha sentido los movimientos del beb durante una hora.  Siente un dolor de cabeza intenso que no se alivia con medicamentos.  Su visin se modifica. Document Released: 01/08/2005 Document Revised: 12/01/2012 [employer] Patient Information 2014 [employer].  

## 2013-06-27 NOTE — Progress Notes (Signed)
P = 92 

## 2013-06-27 NOTE — Progress Notes (Signed)
S: 38 yo [id number] @ 2128w6d here for ROBV Hx of PTD- start 17OHP today Class B dm - fasting- 76-92 - 2 hour pp- 83-125  O: see flowsheet  A/P - Quad screen today - 17-ohp started today - current insulin regimen seems to be working based on values although spilling significant glcuose on UDS that resulted after pt left - recommend CBG at next visit.   - f/u in 2weeks and schedule US at that time for anatomy.

## 2013-06-29 LAB — AFP, QUAD SCREEN
AFP: 34 IU/mL
Age Alone: 1:165 {titer}
Curr Gest Age: 16.1 wks.days
Down Syndrome Scr Risk Est: <1:38500 {titer}
HCG TOTAL: 7236 m[IU]/mL
INH: 134.8 pg/mL
INTERPRETATION-AFP: NEGATIVE
MOM FOR INH: 0.85
MoM for AFP: 1.43
MoM for hCG: 0.36
OPEN SPINA BIFIDA: NEGATIVE
Osb Risk: 1:975 {titer}
Tri 18 Scr Risk Est: NEGATIVE
Trisomy 18 (Edward) Syndrome Interp.: 1:259 {titer}
UE3 MOM: 0.7
uE3 Value: 0.4 ng/mL

## 2013-06-30 ENCOUNTER — Telehealth: Payer: Self-pay

## 2013-06-30 NOTE — Telephone Encounter (Signed)
[facility] [phone]. Informed pt. That quad screen was negative and reassured her that this was good. Pt. Verbalized understanding and gratitude and Had no questions or concerns.

## 2013-06-30 NOTE — Telephone Encounter (Signed)
Message copied by Louanna RawAMPBELL, Bolden Hagerman M on Thu Jun 30, 2013  1:22 PM ------      Message from: Vale HavenBECK, KELI L      Created: Thu Jun 30, 2013 12:06 PM       Can you please let her know her quad is negative?  Thanks! ------

## 2013-07-04 ENCOUNTER — Ambulatory Visit (INDEPENDENT_AMBULATORY_CARE_PROVIDER_SITE_OTHER): Payer: Self-pay | Admitting: *Deleted

## 2013-07-04 VITALS — BP 112/75 | HR 93 | Temp 97.9°F | Wt 155.6 lb

## 2013-07-04 DIAGNOSIS — O09219 Supervision of pregnancy with history of pre-term labor, unspecified trimester: Secondary | ICD-10-CM

## 2013-07-04 NOTE — Progress Notes (Signed)
17P injection given  

## 2013-07-11 ENCOUNTER — Ambulatory Visit (INDEPENDENT_AMBULATORY_CARE_PROVIDER_SITE_OTHER): Payer: Self-pay | Admitting: Obstetrics & Gynecology

## 2013-07-11 ENCOUNTER — Encounter: Payer: Self-pay | Admitting: Family Medicine

## 2013-07-11 VITALS — BP 120/73 | Temp 98.0°F | Wt 151.1 lb

## 2013-07-11 DIAGNOSIS — Z789 Other specified health status: Secondary | ICD-10-CM

## 2013-07-11 DIAGNOSIS — E119 Type 2 diabetes mellitus without complications: Secondary | ICD-10-CM

## 2013-07-11 DIAGNOSIS — Z609 Problem related to social environment, unspecified: Secondary | ICD-10-CM

## 2013-07-11 DIAGNOSIS — O2342 Unspecified infection of urinary tract in pregnancy, second trimester: Secondary | ICD-10-CM

## 2013-07-11 DIAGNOSIS — O24319 Unspecified pre-existing diabetes mellitus in pregnancy, unspecified trimester: Secondary | ICD-10-CM

## 2013-07-11 DIAGNOSIS — O24919 Unspecified diabetes mellitus in pregnancy, unspecified trimester: Secondary | ICD-10-CM

## 2013-07-11 DIAGNOSIS — O2390 Unspecified genitourinary tract infection in pregnancy, unspecified trimester: Secondary | ICD-10-CM

## 2013-07-11 DIAGNOSIS — N390 Urinary tract infection, site not specified: Secondary | ICD-10-CM

## 2013-07-11 DIAGNOSIS — O09529 Supervision of elderly multigravida, unspecified trimester: Secondary | ICD-10-CM

## 2013-07-11 DIAGNOSIS — O09219 Supervision of pregnancy with history of pre-term labor, unspecified trimester: Secondary | ICD-10-CM

## 2013-07-11 DIAGNOSIS — O34219 Maternal care for unspecified type scar from previous cesarean delivery: Secondary | ICD-10-CM

## 2013-07-11 DIAGNOSIS — O0990 Supervision of high risk pregnancy, unspecified, unspecified trimester: Secondary | ICD-10-CM

## 2013-07-11 LAB — POCT URINALYSIS DIP (DEVICE)
Bilirubin Urine: NEGATIVE
Glucose, UA: 100 mg/dL — AB
HGB URINE DIPSTICK: NEGATIVE
Ketones, ur: NEGATIVE mg/dL
Leukocytes, UA: NEGATIVE
Nitrite: POSITIVE — AB
PROTEIN: NEGATIVE mg/dL
UROBILINOGEN UA: 0.2 mg/dL (ref 0.0–1.0)
pH: 5.5 (ref 5.0–8.0)

## 2013-07-11 MED ORDER — ASPIRIN EC 81 MG PO TBEC: 81.0000 mg | DELAYED_RELEASE_TABLET | Freq: Every day | ORAL | Status: DC

## 2013-07-11 MED ORDER — CEPHALEXIN 500 MG PO CAPS: 500.0000 mg | ORAL_CAPSULE | Freq: Four times a day (QID) | ORAL | Status: DC

## 2013-07-11 NOTE — Progress Notes (Signed)
U/S scheduled 07/25/13 at 330 pm.

## 2013-07-11 NOTE — Progress Notes (Signed)
Patient is Spanish-speaking only, Spanish interpreter present for this encounter. Blood sugars are within range, continue insulin UA showed +nitrites, Keflex presumptively prescribed. Will send culture;  follow up results and manage accordingly. Aspirin 81 mg po daily ordered for preeclampsia prevention given risk factor of Type II DM Anatomy scan ordered Continue weekly 17P.  Reassured about pinked tinged discharge, bleeding precautions reviewed. No other complaints or concerns.  Routine obstetric precautions reviewed.

## 2013-07-11 NOTE — Progress Notes (Signed)
P= 105 C/o of intermittent lower back pain relieved by laying down.  Pt. Reports two episodes of light pink discharge in the past 2 weeks; states she noticed it only when she wiped.

## 2013-07-11 NOTE — Patient Instructions (Signed)
Regrese a la clinica cuando tenga su cita. Si tiene problemas o preguntas, llama a la clinica o vaya a la sala de emergencia al [facility].    

## 2013-07-14 LAB — CULTURE, OB URINE: Colony Count: >=100000

## 2013-07-16 ENCOUNTER — Inpatient Hospital Stay (HOSPITAL_COMMUNITY)
Admission: AD | Admit: 2013-07-16 | Discharge: 2013-07-16 | Disposition: A | Discharge status: Home / Self Care | Payer: Self-pay | Source: Ambulatory Visit | Attending: Family Medicine | Admitting: Family Medicine

## 2013-07-16 ENCOUNTER — Inpatient Hospital Stay (HOSPITAL_COMMUNITY): Payer: Medicaid Other

## 2013-07-16 ENCOUNTER — Encounter (HOSPITAL_COMMUNITY): Payer: Self-pay | Admitting: Family

## 2013-07-16 DIAGNOSIS — O24319 Unspecified pre-existing diabetes mellitus in pregnancy, unspecified trimester: Secondary | ICD-10-CM

## 2013-07-16 DIAGNOSIS — O09219 Supervision of pregnancy with history of pre-term labor, unspecified trimester: Secondary | ICD-10-CM

## 2013-07-16 DIAGNOSIS — O4690 Antepartum hemorrhage, unspecified, unspecified trimester: Secondary | ICD-10-CM

## 2013-07-16 DIAGNOSIS — Z789 Other specified health status: Secondary | ICD-10-CM

## 2013-07-16 DIAGNOSIS — O09529 Supervision of elderly multigravida, unspecified trimester: Secondary | ICD-10-CM | POA: Insufficient documentation

## 2013-07-16 DIAGNOSIS — O208 Other hemorrhage in early pregnancy: Secondary | ICD-10-CM | POA: Insufficient documentation

## 2013-07-16 DIAGNOSIS — O0990 Supervision of high risk pregnancy, unspecified, unspecified trimester: Secondary | ICD-10-CM

## 2013-07-16 LAB — CBC
HEMATOCRIT: 34.6 % — AB (ref 36.0–46.0)
Hemoglobin: 12 g/dL (ref 12.0–15.0)
MCH: 31.5 pg (ref 26.0–34.0)
MCHC: 34.7 g/dL (ref 30.0–36.0)
MCV: 90.8 fL (ref 78.0–100.0)
Platelets: 229 10*3/uL (ref 150–400)
RBC: 3.81 MIL/uL — ABNORMAL LOW (ref 3.87–5.11)
RDW: 13.8 % (ref 11.5–15.5)
WBC: 15 10*3/uL — ABNORMAL HIGH (ref 4.0–10.5)

## 2013-07-16 LAB — WET PREP, GENITAL
Clue Cells Wet Prep HPF POC: NONE SEEN
Trich, Wet Prep: NONE SEEN
Yeast Wet Prep HPF POC: NONE SEEN

## 2013-07-16 LAB — ABO/RH: ABO/RH(D): O POS

## 2013-07-16 LAB — HCG, QUANTITATIVE, PREGNANCY: HCG, BETA CHAIN, QUANT, S: 3078 m[IU]/mL — AB (ref <5)

## 2013-07-16 NOTE — MAU Note (Signed)
38 yo, G4P2 at 5353w4d, presents to MAU with c/o VB since 0800. She denies pain at this time. She has not soaked a pad. She takes low-dose ASA since Tuesday, per clinic instructions. She also began Keflex for UTI since that time.

## 2013-07-16 NOTE — MAU Provider Note (Signed)
History     CSN: [id number]  Arrival date and time: 07/16/13 1000   None     No chief complaint on file.  HPI Comments: Rachael Russo 38 y.o. [id number] presents to MAU with vaginal bleeding at [redacted] weeks pregnant. She started bleeding this morning and feels like its her menses. She denies any pain. She had had her NOB visit. Her last pregnancy ended with miscarriage at 18 weeks. She is currently on 17-P     Past Medical History  Diagnosis Date  . Diabetes mellitus   . AMA (advanced maternal age) multigravida 35+ 05/09/2013    Offered genetic screening, discussed options, patient desires quad screen [facility]  Dating LMP consistent with 10 week scan  Genetic Screen  Quad:                  NIPS:  Anatomic US   GTT Type II DM  TDaP vaccine   Flu vaccine   GBS   Baby Food Breast  Contraception Condoms  Pediatrician [name]  Support Person Mother        Past Surgical History  Procedure Laterality Date  . Cesarean section    . Coronary artery bypass graft      Family History  Problem Relation Age of Onset  . Diabetes Mother     History  Substance Use Topics  . Smoking status: Never Smoker   . Smokeless tobacco: Not on file  . Alcohol Use: No    Allergies: No Known Allergies  Facility-administered medications prior to admission  Medication Dose Route Frequency Provider Last Rate Last Dose  . hydroxyprogesterone caproate (DELALUTIN) 250 mg/mL injection 250 mg  250 mg Intramuscular Weekly Vale HavenKeli L Beck, MD   250 mg at 07/11/13 0830   Prescriptions prior to admission  Medication Sig Dispense Refill  . aspirin EC 81 MG tablet Take 1 tablet (81 mg total) by mouth daily.  30 tablet  10  . cephALEXin (KEFLEX) 500 MG capsule Take 1 capsule (500 mg total) by mouth 4 (four) times daily.  28 capsule  2  . insulin NPH Human (HUMULIN N,NOVOLIN N) 100 UNIT/ML injection 20u qam, 14 u qhs  10 mL  3  . insulin regular (HUMULIN R) 100 units/mL injection Inject 0.08-0.12 mLs (8-12  Units total) into the skin 2 (two) times daily before a meal. 12u qm, 8 u qac dinner  10 mL  11  . prenatal vitamin w/FE, FA (PRENATAL 1 + 1) 27-1 MG TABS tablet Take 1 tablet by mouth daily at 12 noon.        Review of Systems  Constitutional: Negative.   HENT: Negative.   Eyes: Negative.   Respiratory: Negative.   Cardiovascular: Negative.   Gastrointestinal: Negative.   Genitourinary:       Vaginal bleeding  Musculoskeletal: Negative.   Skin: Negative.   Neurological: Negative.   Psychiatric/Behavioral: Negative.    Physical Exam   Blood pressure 124/71, pulse 97, temperature 98 F (36.7 C), temperature source Oral, resp. rate 14, last menstrual period 03/08/2013.  Physical Exam  Constitutional: She is oriented to person, place, and time. She appears well-developed and well-nourished. No distress.  HENT:  Head: Normocephalic and atraumatic.  Eyes: Conjunctivae are normal.  GI: Soft. Bowel sounds are normal. She exhibits no distension and no mass. There is tenderness. There is no rebound and no guarding.  Genitourinary:  Genital:External negative Vaginal:Moderate amount bright red blood Cervix: dilated 1 cm Bimanual: tender uterus +  FHT 145  Musculoskeletal: Normal range of motion.  Neurological: She is alert and oriented to person, place, and time.  Skin: Skin is warm and dry.  Psychiatric: She has a normal mood and affect. Her behavior is normal. Judgment and thought content normal.   Results for orders placed during the hospital encounter of 07/16/13 (from the past 24 hour(s))  WET PREP, GENITAL     Status: Abnormal   Collection Time    07/16/13 11:10 AM      Result Value Ref Range   Yeast Wet Prep HPF POC NONE SEEN  NONE SEEN   Trich, Wet Prep NONE SEEN  NONE SEEN   Clue Cells Wet Prep HPF POC NONE SEEN  NONE SEEN   WBC, Wet Prep HPF POC FEW (*) NONE SEEN  CBC     Status: Abnormal   Collection Time    07/16/13 11:25 AM      Result Value Ref Range   WBC 15.0  (*) 4.0 - 10.5 K/uL   RBC 3.81 (*) 3.87 - 5.11 MIL/uL   Hemoglobin 12.0  12.0 - 15.0 g/dL   HCT 16.1 (*) 09.6 - 04.5 %   MCV 90.8  78.0 - 100.0 fL   MCH 31.5  26.0 - 34.0 pg   MCHC 34.7  30.0 - 36.0 g/dL   RDW 40.9  81.1 - 91.4 %   Platelets 229  150 - 400 K/uL  ABO/RH     Status: None   Collection Time    07/16/13 11:25 AM      Result Value Ref Range   ABO/RH(D) O POS    HCG, QUANTITATIVE, PREGNANCY     Status: Abnormal   Collection Time    07/16/13 11:25 AM      Result Value Ref Range   hCG, Beta Chain, Quant, S 3078 (*) <5 mIU/mL   US Ob Limited  07/16/2013   OBSTETRICAL ULTRASOUND: This exam was performed within a [facility]. The OB US report was generated in the AS system, and faxed to the ordering physician.   This report is also available in [employer] and in the [employer]. See AS Obstetric US report.  US Ob Transvaginal  07/16/2013   OBSTETRICAL ULTRASOUND: This exam was performed within a [facility]. The OB US report was generated in the AS system, and faxed to the ordering physician.   This report is also available in [employer] and in the [employer]. See AS Obstetric US report.  MFM Impression: Single IUP. Anterior placenta without evidence of previa. Normal amniotic fluids. Normal transvaginal cervical. Debris is lower segment. Small [facility]   MAU Course  Procedures  MDM Wet prep, GC, Chlamydia, CBC, UA, U/S, ABORh,  Reviewed case with Dr Shawnie Pons who advised pelvic rest and keep follow up appointmnet  Assessment and Plan   A: Vaginal bleeding in Second Trimester Sub Chorionic hemorrhage  P: Pelvic rest until told otherwise Keep appointment at [facility] on Monday for 17-P Return to MAU if bleeding returns     Carolynn Serve 07/16/2013, 11:16 AM

## 2013-07-16 NOTE — MAU Provider Note (Signed)
Attestation of Attending Supervision of Advanced Practitioner (PA/CNM/NP): Evaluation and management procedures were performed by the Advanced Practitioner under my supervision and collaboration.  I have reviewed the Advanced Practitioner's note and chart, and I agree with the management and plan.  Annaya Bangert S, MD [facility] Attending 07/16/2013 8:27 PM   

## 2013-07-16 NOTE — Discharge Instructions (Signed)
Hemorragia vaginal durante el embarazo   (Vaginal Bleeding During Pregnancy)   En cualquier momento del embarazo podrá perder una pequeña cantidad de sangre por la vagina. Esta situación generalmente mejora por sí misma. Sin embargo, algunos sangrados pueden ser graves. Asegúrese de informar a su médico si tiene un sangrado vaginal.  CUIDADOS EN EL HOGAR   · Descanse y duerma lo suficiente.  · Permanezca en la cama y sólo levántese para ir al baño según las indicaciones del médico.  · Anote cuántos apósitos usa cada día. Describa el grado en que están empapados.  · No use tampones. No higienice la vagina con un chorro de agua (ducha).  · No tenga sexo (relaciones sexuales) ni coloque nada dentro de la vagina. Pida la aprobación de su médico.  · Si elimina tejidos por la vagina, guárdelos. Muéstreselos a su médico.  · Sólo tome los medicamentos que le indique el médico.  · Siga los consejos del médico acerca de levantar objetos pesados, conducir automóviles y realizar actividad física.  SOLICITE AYUDA DE INMEDIATO SI:   · Siente que el bebé se mueve menos, o no se mueve.  · Se desvanece (se desmaya) al ir al baño.  · Aumenta el sangrado.  · Comienza a tener contracciones.  · Siente cólicos intensos en el estómago, en la espalda o en el vientre (abdomen).  · Tiene una pérdida importante o sale líquido a borbotones por la vagina.  · Se siente mareada o débil.  · Tiene escalofríos.  · Elimina grumos de tejido o coágulos de sangre por la vagina.  · Tiene fiebre.  ASEGÚRESE DE QUE:   · Comprende estas instrucciones.  · Controlará su enfermedad.  · Solicitará ayuda de inmediato si no mejora o si empeora.  Document Released: 03/17/2012  ExitCare® Patient Information ©2014 [employer].

## 2013-07-18 ENCOUNTER — Ambulatory Visit (INDEPENDENT_AMBULATORY_CARE_PROVIDER_SITE_OTHER): Payer: Self-pay | Admitting: General Practice

## 2013-07-18 VITALS — BP 119/70 | HR 101 | Temp 97.1°F | Ht 62.0 in | Wt 156.6 lb

## 2013-07-18 DIAGNOSIS — O09219 Supervision of pregnancy with history of pre-term labor, unspecified trimester: Secondary | ICD-10-CM

## 2013-07-18 NOTE — Progress Notes (Signed)
Patient presents with malfunctioning glucometer. New TrueTrack glucometer dispensed: Lot: [id number] Exp: 01/27/2015

## 2013-07-18 NOTE — Progress Notes (Signed)
Patient here today for 17p; States she came in over the weekend for bleeding. Per chart review subchronic hemorrhage noted on ultrasound, patient d/c home. Patient states the bleeding has almost stopped today, reports no other problems. Discussed with Dr Ike Benedom, who stated patient didn't need to be seen by provider today and could see provider next week as scheduled.

## 2013-07-19 LAB — GC/CHLAMYDIA PROBE AMP: CT PROBE, AMP APTIMA: UNDETERMINED

## 2013-07-25 ENCOUNTER — Encounter: Payer: Self-pay | Admitting: Obstetrics & Gynecology

## 2013-07-25 ENCOUNTER — Ambulatory Visit (INDEPENDENT_AMBULATORY_CARE_PROVIDER_SITE_OTHER): Payer: Self-pay | Admitting: Family Medicine

## 2013-07-25 ENCOUNTER — Ambulatory Visit (HOSPITAL_COMMUNITY)
Admission: RE | Admit: 2013-07-25 | Discharge: 2013-07-25 | Disposition: A | Discharge status: Home / Self Care | Payer: Medicaid Other | Source: Ambulatory Visit | Attending: Obstetrics & Gynecology | Admitting: Obstetrics & Gynecology

## 2013-07-25 VITALS — BP 112/72 | Temp 97.7°F | Wt 160.5 lb

## 2013-07-25 DIAGNOSIS — O34219 Maternal care for unspecified type scar from previous cesarean delivery: Secondary | ICD-10-CM

## 2013-07-25 DIAGNOSIS — O09219 Supervision of pregnancy with history of pre-term labor, unspecified trimester: Secondary | ICD-10-CM

## 2013-07-25 DIAGNOSIS — E119 Type 2 diabetes mellitus without complications: Secondary | ICD-10-CM

## 2013-07-25 DIAGNOSIS — O24919 Unspecified diabetes mellitus in pregnancy, unspecified trimester: Secondary | ICD-10-CM | POA: Insufficient documentation

## 2013-07-25 DIAGNOSIS — O0990 Supervision of high risk pregnancy, unspecified, unspecified trimester: Secondary | ICD-10-CM

## 2013-07-25 DIAGNOSIS — O09529 Supervision of elderly multigravida, unspecified trimester: Secondary | ICD-10-CM | POA: Insufficient documentation

## 2013-07-25 DIAGNOSIS — O24319 Unspecified pre-existing diabetes mellitus in pregnancy, unspecified trimester: Secondary | ICD-10-CM

## 2013-07-25 LAB — POCT URINALYSIS DIP (DEVICE)
Bilirubin Urine: NEGATIVE
Glucose, UA: 250 mg/dL — AB
Ketones, ur: NEGATIVE mg/dL
Nitrite: NEGATIVE
PROTEIN: NEGATIVE mg/dL
Specific Gravity, Urine: 1.02 (ref 1.005–1.030)
UROBILINOGEN UA: 0.2 mg/dL (ref 0.0–1.0)
pH: 6.5 (ref 5.0–8.0)

## 2013-07-25 NOTE — Progress Notes (Signed)
38 yo with AMA, class B DM, and hx of PTL and delivery here for ROBV. - doing well. No concerns. No vb, lof, ctx. +FM - DM- fasting: 80s-90s.  2hr pp: 62-122.   O: see above.   A/P - cont ASA - cont insulin. No change in dose - anatomy us today - f/u in 2 weeks

## 2013-07-25 NOTE — Progress Notes (Signed)
Pulse- 94 

## 2013-07-25 NOTE — Patient Instructions (Signed)
Second Trimester of Pregnancy The second trimester is from week 13 through week 28, months 4 through 6. The second trimester is often a time when you feel your best. Your body has also adjusted to being pregnant, and you begin to feel better physically. Usually, morning sickness has lessened or quit completely, you may have more energy, and you may have an increase in appetite. The second trimester is also a time when the fetus is growing rapidly. At the end of the sixth month, the fetus is about 9 inches long and weighs about 1 pounds. You will likely begin to feel the baby move (quickening) between 18 and 20 weeks of the pregnancy. BODY CHANGES Your body goes through many changes during pregnancy. The changes vary from woman to woman.   Your weight will continue to increase. You will notice your lower abdomen bulging out.  You may begin to get stretch marks on your hips, abdomen, and breasts.  You may develop headaches that can be relieved by medicines approved by your caregiver.  You may urinate more often because the fetus is pressing on your bladder.  You may develop or continue to have heartburn as a result of your pregnancy.  You may develop constipation because certain hormones are causing the muscles that push waste through your intestines to slow down.  You may develop hemorrhoids or swollen, bulging veins (varicose veins).  You may have back pain because of the weight gain and pregnancy hormones relaxing your joints between the bones in your pelvis and as a result of a shift in weight and the muscles that support your balance.  Your breasts will continue to grow and be tender.  Your gums may bleed and may be sensitive to brushing and flossing.  Dark spots or blotches (chloasma, mask of pregnancy) may develop on your face. This will likely fade after the baby is born.  A dark line from your belly button to the pubic area (linea nigra) may appear. This will likely fade after the  baby is born. WHAT TO EXPECT AT YOUR PRENATAL VISITS During a routine prenatal visit:  You will be weighed to make sure you and the fetus are growing normally.  Your blood pressure will be taken.  Your abdomen will be measured to track your baby's growth.  The fetal heartbeat will be listened to.  Any test results from the previous visit will be discussed. Your caregiver may ask you:  How you are feeling.  If you are feeling the baby move.  If you have had any abnormal symptoms, such as leaking fluid, bleeding, severe headaches, or abdominal cramping.  If you have any questions. Other tests that may be performed during your second trimester include:  Blood tests that check for:  Low iron levels (anemia).  Gestational diabetes (between 24 and 28 weeks).  Rh antibodies.  Urine tests to check for infections, diabetes, or protein in the urine.  An ultrasound to confirm the proper growth and development of the baby.  An amniocentesis to check for possible genetic problems.  Fetal screens for spina bifida and Down syndrome. HOME CARE INSTRUCTIONS   Avoid all smoking, herbs, alcohol, and unprescribed drugs. These chemicals affect the formation and growth of the baby.  Follow your caregiver's instructions regarding medicine use. There are medicines that are either safe or unsafe to take during pregnancy.  Exercise only as directed by your caregiver. Experiencing uterine cramps is a good sign to stop exercising.  Continue to eat regular,   healthy meals.  Wear a good support bra for breast tenderness.  Do not use hot tubs, steam rooms, or saunas.  Wear your seat belt at all times when driving.  Avoid raw meat, uncooked cheese, cat litter boxes, and soil used by cats. These carry germs that can cause birth defects in the baby.  Take your prenatal vitamins.  Try taking a stool softener (if your caregiver approves) if you develop constipation. Eat more high-fiber foods,  such as fresh vegetables or fruit and whole grains. Drink plenty of fluids to keep your urine clear or pale yellow.  Take warm sitz baths to soothe any pain or discomfort caused by hemorrhoids. Use hemorrhoid cream if your caregiver approves.  If you develop varicose veins, wear support hose. Elevate your feet for 15 minutes, 3 4 times a day. Limit salt in your diet.  Avoid heavy lifting, wear low heel shoes, and practice good posture.  Rest with your legs elevated if you have leg cramps or low back pain.  Visit your dentist if you have not gone yet during your pregnancy. Use a soft toothbrush to brush your teeth and be gentle when you floss.  A sexual relationship may be continued unless your caregiver directs you otherwise.  Continue to go to all your prenatal visits as directed by your caregiver. SEEK MEDICAL CARE IF:   You have dizziness.  You have mild pelvic cramps, pelvic pressure, or nagging pain in the abdominal area.  You have persistent nausea, vomiting, or diarrhea.  You have a bad smelling vaginal discharge.  You have pain with urination. SEEK IMMEDIATE MEDICAL CARE IF:   You have a fever.  You are leaking fluid from your vagina.  You have spotting or bleeding from your vagina.  You have severe abdominal cramping or pain.  You have rapid weight gain or loss.  You have shortness of breath with chest pain.  You notice sudden or extreme swelling of your face, hands, ankles, feet, or legs.  You have not felt your baby move in over an hour.  You have severe headaches that do not go away with medicine.  You have vision changes. Document Released: 03/25/2001 Document Revised: 12/01/2012 Document Reviewed: 06/01/2012 ExitCare Patient Information 2014 [employer].  

## 2013-07-26 ENCOUNTER — Other Ambulatory Visit: Payer: Self-pay | Admitting: Obstetrics & Gynecology

## 2013-07-26 DIAGNOSIS — O09529 Supervision of elderly multigravida, unspecified trimester: Secondary | ICD-10-CM

## 2013-07-26 DIAGNOSIS — O24319 Unspecified pre-existing diabetes mellitus in pregnancy, unspecified trimester: Secondary | ICD-10-CM

## 2013-08-01 ENCOUNTER — Ambulatory Visit (INDEPENDENT_AMBULATORY_CARE_PROVIDER_SITE_OTHER): Payer: Self-pay | Admitting: *Deleted

## 2013-08-01 VITALS — BP 112/72 | HR 97 | Temp 98.1°F | Wt 159.5 lb

## 2013-08-01 DIAGNOSIS — O09219 Supervision of pregnancy with history of pre-term labor, unspecified trimester: Secondary | ICD-10-CM

## 2013-08-08 ENCOUNTER — Ambulatory Visit (INDEPENDENT_AMBULATORY_CARE_PROVIDER_SITE_OTHER): Payer: Self-pay | Admitting: Family Medicine

## 2013-08-08 ENCOUNTER — Encounter: Payer: Self-pay | Admitting: Family Medicine

## 2013-08-08 VITALS — BP 128/79 | HR 103 | Temp 97.0°F | Wt 161.1 lb

## 2013-08-08 DIAGNOSIS — O0990 Supervision of high risk pregnancy, unspecified, unspecified trimester: Secondary | ICD-10-CM

## 2013-08-08 DIAGNOSIS — E119 Type 2 diabetes mellitus without complications: Secondary | ICD-10-CM

## 2013-08-08 DIAGNOSIS — O24919 Unspecified diabetes mellitus in pregnancy, unspecified trimester: Secondary | ICD-10-CM

## 2013-08-08 DIAGNOSIS — O09219 Supervision of pregnancy with history of pre-term labor, unspecified trimester: Secondary | ICD-10-CM

## 2013-08-08 DIAGNOSIS — O24319 Unspecified pre-existing diabetes mellitus in pregnancy, unspecified trimester: Secondary | ICD-10-CM

## 2013-08-08 LAB — POCT URINALYSIS DIP (DEVICE)
BILIRUBIN URINE: NEGATIVE
Glucose, UA: 100 mg/dL — AB
Hgb urine dipstick: NEGATIVE
KETONES UR: NEGATIVE mg/dL
LEUKOCYTES UA: NEGATIVE
NITRITE: NEGATIVE
PH: 6.5 (ref 5.0–8.0)
Protein, ur: NEGATIVE mg/dL
Specific Gravity, Urine: 1.02 (ref 1.005–1.030)
Urobilinogen, UA: 0.2 mg/dL (ref 0.0–1.0)

## 2013-08-08 NOTE — Progress Notes (Signed)
U/S scheduled 08/22/13 at 315pm.

## 2013-08-08 NOTE — Patient Instructions (Signed)
Segundo trimestre del embarazo  (Second Trimester of Pregnancy) El segundo trimestre del embarazo se extiende desde la semana 13 hasta la semana 28, del 4 al 6 mes. En general, es el momento del embarazo en el que se sentir mejor. Su organismo se ha adaptado a estar embarazada y comienza a sentirse fsicamente mejor. En general las nuseas matutinas han disminuido o han desaparecido completamente. El segundo trimestre es tambin la poca en la que el feto se desarrolla rpidamente. Hacia el final del sexto mes, el beb mide aproximadamente 9 pulgadas (23 cm) y pesa alrededor de 1  libras (700 g). Es probable que sienta mover al beb (dar pataditas) entre las 18 y 20 semanas del embarazo.  CAMBIOS CORPORALES  Su organismo atravesar numerosos cambios durante el embarazo. Los cambios varan de una mujer a otra.   Seguir aumentando de peso. Notar que la parte baja del abdomen se ensancha.  Podrn aparecer las primeras estras en las caderas, abdomen y mamas.  Es posible que sienta cefaleas, que se pueden aliviar con los medicamentos que su mdico le autorice a utilizar.  Tendr necesidad de orinar con ms frecuencia porque el feto est presionando en la vejiga.  Como consecuencia del embarazo, podr sentir acidez estomacal continuamente.  Podr estar constipada ya que ciertas hormonas hacen que los msculos que empujan los desechos a travs de los intestinos trabajen ms lentamente.  Pueden aparecer hemorroides o abultarse las venas (venas varicosas).  El dolor de espalda se debe al aumento de peso y a que las hormonas del embarazo relajan las articulaciones entre los huesos de la pelvis y a la modificacin del peso y a los msculos que soportan el equilibrio.  Sus mamas seguirn desarrollndose y estarn ms sensibles.  Las encas pueden sangrar y estar sensibles al cepillado y al hilo dental.  Pueden aparecer en el rostro zonas oscuras o manchas (cloasma, mscara del embarazo). Esto  desaparece despus del nacimiento del beb.  Es posible que se formeuna lnea oscura desde el ombligo a la zona del pubis (linea nigra). Esto desaparece despus del nacimiento del beb. QU DEBE ESPERAR EN LAS CONSULTAS PRENATALES  Durante una visita prenatal de rutina:   La pesarn para verificar que usted y el feto se encuentran dentro de los lmites normales.  Le tomarn la presin arterial.  Le medirn el abdomen para verificar el desarrollo del beb.  Escucharn los latidos fetales.  Se evaluarn los resultados de los estudios realizados en visitas anteriores. El mdico le preguntar:   Cmo se siente.  Si siente los movimientos del beb.  Si tiene sntomas anormales, como prdida de lquido, sangrado, dolores de cabeza intensos o clicos abdominales.  Si tiene alguna duda. Otros estudios que podrn realizarse durante el segundo trimestre son:   Anlisis de sangre para evaluar:  Niveles bajos de hierro (anemia).  Diabetes gestacional (entre las 24 y las 28 semanas).  Anticuerpos Rh.  Anlisis de orina para detectar infecciones, diabetes o protenas en la orina.  Una ecografa para confirmar si el crecimiento y el desarrollo del beb son los adecuados.  Una amniocentesis para diagnosticar posibles problemas genticos.  Estudios del feto para descartar espina bfida y sndrome de [place]. INSTRUCCIONES PARA EL CUIDADO EN EL HOGAR   Evite fumar, consumir hierbas, beber alcohol y utilizar frmacos que no ne hayan recetado. Estas sustancias qumicas afectan la formacin y el desarrollo del beb.  Siga las indicaciones del profesional con respecto a como tomar los medicamentos. Durante el embarazo, hay   medicamentos que son seguros y otros no lo son.  Realice actividad fsica slo segn las indicaciones del mdico. Sentir clicos uterinos es el mejor signo para detener la actividad fsica.  Contine haciendo comidas regulares y sanas.  Use un sostn que le brinde buen  soporte si sus mamas estn sensibles.  No utilice la baera con agua caliente, baos turcos y saunas.  Colquese el cinturn de seguridad cuando conduzca.  Evite comer carne cruda y el contacto con los utensilios y desperdicios de los gatos. Estos elementos contienen grmenes que pueden causar defectos de nacimiento en el beb.  Tome las vitaminas indicadas para la etapa prenatal.  Pruebe un laxante (si el mdico la autoriza) si tiene constipacin. Consuma ms alimentos ricos en fibra, como vegetales y frutas frescos y cereales enteros. Beba gran cantidad de lquido para mantener la orina de tono claro o amarillo plido.  Tome baos de agua tibia para calmar el dolor o las molestias causadas por las hemorroides. Use una crema para las hemorroides si el mdico la autoriza.  Si tiene venas varicosas, use medias de soporte. Eleve los pies durante 15 minutos, 3 o 4 veces por da. Limite el consumo de sal en su dieta.  Evite levantar objetos pesados, use zapatos de tacones bajos y mantenga una buena postura.  Descanse con las piernas elevadas si tiene calambres o dolor de cintura.  Visite a su dentista si no lo ha hecho durante el embarazo. Use un cepillo de dientes blando para higienizarse los dientes y use suavemente el hilo dental.  Puede continuar su vida sexual siempre que el mdico la autorice.  Concurra a todas las visitas prenatales segn las indicaciones de su mdico. SOLICITE ATENCIN MDICA SI:   Tiene mareos.  Siente clicos leves, presin en la pelvis o dolor persistente en el abdomen.  Tiene nuseas o vmitos o diarrea persistentes.  Observa una secrecin vaginal con mal olor.  Siente dolor al orinar. SOLICITE ATENCIN MDICA DE INMEDIATO SI:   Tiene fiebre.  Pierde lquido por la vagina.  Tiene sangrando o pequeas prdidas vaginales.  Siente dolor intenso o clicos en el abdomen.  Sube o baja de peso rpidamente.  Tiene dificultad para respirar y le duele  pecho.  Sbitamente se le hincha el rostro, las manos, los tobillos, los pies o las piernas de manera extrema.  No ha sentido los movimientos del beb durante una hora.  Siente un dolor de cabeza intenso que no se alivia con medicamentos.  Su visin se modifica. Document Released: 01/08/2005 Document Revised: 12/01/2012 [employer] Patient Information 2014 [employer].  

## 2013-08-08 NOTE — Progress Notes (Signed)
DM2:  F  93/90/89/82/91/92/91/83/78/92/92/84 2B 110/118/112/113/110/115/121/85/110/109/108/100 2L  84/76/100/101/102/80/80/95/74/89/90/84 2D 122/85/118/120/122/117/115/117/120/118/115/118 Does not remember her diabetes insulin regimen. No changes  +FM, no lof, no vb, no ctx  Complains constipated. Has not taken meds. - miralax  [name] is a 38 y.o. [id number] at 6224w6d presents for ROB  Discussed with Patient:  - Reviewed genetics screen (Quad screen / first trimester screen / serum integrated screen / full integrated screen done/ not done).   - Patient plans on breast/ bottle feeding. - Routine precautions discussed (depression, infection s/s).   Patient provided with all pertinent phone numbers for emergencies. - RTC for any VB, regular, painful cramps/ctxs occurring at a rate of >2/10 min, fever (100.5 or higher), n/v/d, any pain that is unresolving or worsening. - RTC in 1 weeks for next injection  Problems: Patient Active Problem List   Diagnosis Date Noted  . Supervision of high-risk pregnancy 05/30/2013  . AMA (advanced maternal age) multigravida 35+ 05/09/2013  . Previous preterm delivery, antepartum 05/09/2013  . Previous cesarean section complicating pregnancy, antepartum  05/09/2013  . Language barrier, speaks Spanish only and requires interpreter 05/09/2013  . Bleeding in early pregnancy, antepartum 05/09/2013  . UTI (urinary tract infection) in pregnancy in first trimester 05/01/2013  . Type II Diabetes complicating pregnancy, antepartum 10/15/2006    To Do: 1. Repeat anatomy sca 2. Echo fetal 3. 17p inject 4. Cont insulin regimen - no lows

## 2013-08-15 ENCOUNTER — Ambulatory Visit (INDEPENDENT_AMBULATORY_CARE_PROVIDER_SITE_OTHER): Payer: Self-pay | Admitting: *Deleted

## 2013-08-15 VITALS — BP 122/80 | HR 101 | Temp 98.1°F | Wt 161.2 lb

## 2013-08-15 DIAGNOSIS — O09219 Supervision of pregnancy with history of pre-term labor, unspecified trimester: Secondary | ICD-10-CM

## 2013-08-22 ENCOUNTER — Encounter: Payer: Self-pay | Admitting: Family Medicine

## 2013-08-22 ENCOUNTER — Other Ambulatory Visit (INDEPENDENT_AMBULATORY_CARE_PROVIDER_SITE_OTHER): Payer: Self-pay

## 2013-08-22 ENCOUNTER — Other Ambulatory Visit: Payer: Self-pay | Admitting: Family Medicine

## 2013-08-22 ENCOUNTER — Ambulatory Visit (HOSPITAL_COMMUNITY)
Admission: RE | Admit: 2013-08-22 | Discharge: 2013-08-22 | Disposition: A | Discharge status: Home / Self Care | Payer: Medicaid Other | Source: Ambulatory Visit | Attending: Family Medicine | Admitting: Family Medicine

## 2013-08-22 ENCOUNTER — Ambulatory Visit (INDEPENDENT_AMBULATORY_CARE_PROVIDER_SITE_OTHER): Payer: Self-pay | Admitting: Family Medicine

## 2013-08-22 VITALS — BP 109/70 | HR 108 | Temp 98.5°F | Wt 163.9 lb

## 2013-08-22 DIAGNOSIS — Z3689 Encounter for other specified antenatal screening: Secondary | ICD-10-CM | POA: Insufficient documentation

## 2013-08-22 DIAGNOSIS — O24319 Unspecified pre-existing diabetes mellitus in pregnancy, unspecified trimester: Secondary | ICD-10-CM

## 2013-08-22 DIAGNOSIS — O24919 Unspecified diabetes mellitus in pregnancy, unspecified trimester: Secondary | ICD-10-CM

## 2013-08-22 DIAGNOSIS — O09529 Supervision of elderly multigravida, unspecified trimester: Secondary | ICD-10-CM

## 2013-08-22 DIAGNOSIS — E119 Type 2 diabetes mellitus without complications: Secondary | ICD-10-CM | POA: Insufficient documentation

## 2013-08-22 DIAGNOSIS — N883 Incompetence of cervix uteri: Secondary | ICD-10-CM

## 2013-08-22 DIAGNOSIS — O09219 Supervision of pregnancy with history of pre-term labor, unspecified trimester: Secondary | ICD-10-CM

## 2013-08-22 LAB — POCT URINALYSIS DIP (DEVICE)
BILIRUBIN URINE: NEGATIVE
Glucose, UA: 100 mg/dL — AB
Hgb urine dipstick: NEGATIVE
KETONES UR: NEGATIVE mg/dL
Leukocytes, UA: NEGATIVE
Nitrite: NEGATIVE
PROTEIN: NEGATIVE mg/dL
SPECIFIC GRAVITY, URINE: 1.025 (ref 1.005–1.030)
Urobilinogen, UA: 0.2 mg/dL (ref 0.0–1.0)
pH: 5.5 (ref 5.0–8.0)

## 2013-08-22 MED ORDER — PROGESTERONE MICRONIZED 200 MG PO CAPS: 200.0000 mg | ORAL_CAPSULE | Freq: Every day | ORAL | Status: DC

## 2013-08-22 MED ORDER — BETAMETHASONE SOD PHOS & ACET 6 (3-3) MG/ML IJ SUSP
12.0000 mg | Freq: Once | INTRAMUSCULAR | Status: AC
  Administered 2013-08-22: 12 mg via INTRAMUSCULAR

## 2013-08-22 NOTE — Progress Notes (Signed)
Dr. Sherrie Georgeecker came to the front office and informed me that the pt just had an US and resulted in shortened cervix measuring about 1.7 cm and funneling was seen.  Dr. Sherrie Georgeecker stated that she knew patient is currently receiving 17 p and that she wants her to receive betamethasone and for her to start prometrium 200 mg.  Dr. Sherrie Georgeecker also wants pt to come in for a follow up US at MFM in one week, which has been scheduled for May 18th at 0915.    I informed Dr. Macon LargeAnyanwu of Dr. Sherrie Georgeecker recommendations she stated "ok, that is fine".  I informed pt with [name] that her prometrium is at her [facility] pharmacy and to insert into vagina at bedtime and that she will need to come in tomorrow at this time for her second dose of betamethasone. I informed pt of the appt time.   Pt stated that is fine. Pt agreed to coming in tomorrow, May 08/23/13 for injection.    Clarified with pt if she had any questions concerning her shortened cervix and reclarified pts appts for MFM and injection and [facility] [address]  Pt stated understanding.

## 2013-08-22 NOTE — Progress Notes (Addendum)
F 98/90/80/95/99/99/98/95/97/98/99/80 B 123/119/100/103/118/112/120/119/110/118/115 L 65/82/99/84/100/76/73/100/85/99/84 D 118/122/123/122/123/115/118/122/120/123/118  NPH 22/14  -->22/16 R 12/8         -->12/10 Denies hypoglycemia US today  +FM, no lof, no vb, no ctx Rachael IslamDoris Sotelo is a 38 y.o. [id number] at 3366w6d here for ROB visit.  Discussed with Patient:  -Plans to breast feed.  All questions answered. -Continue prenatal vitamins. - Reviewed genetics screen (Quad screen / first trimester screen / serum integrated screen / full integrated screen done/ not done).   -Reviewed fetal kick counts (Pt to perform daily at a time when the baby is active, lie laterally with both hands on belly in quiet room and count all movements (hiccups, shoulder rolls, obvious kicks, etc); pt is to report to clinic or MAU for less than 10 movements felt in a one hour time period-pt told as soon as she counts 10 movements the count is complete.)  - Routine precautions discussed (depression, infection s/s).   Patient provided with all pertinent phone numbers for emergencies. - RTC for any VB, regular, painful cramps/ctxs occurring at a rate of >2/10 min, fever (100.5 or higher), n/v/d, any pain that is unresolving or worsening, LOF, decreased fetal movement, CP, SOB, edema - return to clinic in 2 weeks  Problems: Patient Active Problem List   Diagnosis Date Noted  . Supervision of high-risk pregnancy 05/30/2013  . AMA (advanced maternal age) multigravida 35+ 05/09/2013  . Previous preterm delivery, antepartum 05/09/2013  . Previous cesarean section complicating pregnancy, antepartum  05/09/2013  . Language barrier, speaks Spanish only and requires interpreter 05/09/2013  . Bleeding in early pregnancy, antepartum 05/09/2013  . UTI (urinary tract infection) in pregnancy in first trimester 05/01/2013  . Type II Diabetes complicating pregnancy, antepartum 10/15/2006   Edu: [x ] PTL precautions; [ ]  BF class; [  ] childbirth class; [ ]   BF counseling;

## 2013-08-22 NOTE — Patient Instructions (Signed)
Segundo trimestre del embarazo  (Second Trimester of Pregnancy) El segundo trimestre del embarazo se extiende desde la semana 13 hasta la semana 28, del 4 al 6 mes. En general, es el momento del embarazo en el que se sentir mejor. Su organismo se ha adaptado a estar embarazada y comienza a sentirse fsicamente mejor. En general las nuseas matutinas han disminuido o han desaparecido completamente. El segundo trimestre es tambin la poca en la que el feto se desarrolla rpidamente. Hacia el final del sexto mes, el beb mide aproximadamente 9 pulgadas (23 cm) y pesa alrededor de 1  libras (700 g). Es probable que sienta mover al beb (dar pataditas) entre las 18 y 20 semanas del embarazo.  CAMBIOS CORPORALES  Su organismo atravesar numerosos cambios durante el embarazo. Los cambios varan de una mujer a otra.   Seguir aumentando de peso. Notar que la parte baja del abdomen se ensancha.  Podrn aparecer las primeras estras en las caderas, abdomen y mamas.  Es posible que sienta cefaleas, que se pueden aliviar con los medicamentos que su mdico le autorice a utilizar.  Tendr necesidad de orinar con ms frecuencia porque el feto est presionando en la vejiga.  Como consecuencia del embarazo, podr sentir acidez estomacal continuamente.  Podr estar constipada ya que ciertas hormonas hacen que los msculos que empujan los desechos a travs de los intestinos trabajen ms lentamente.  Pueden aparecer hemorroides o abultarse las venas (venas varicosas).  El dolor de espalda se debe al aumento de peso y a que las hormonas del embarazo relajan las articulaciones entre los huesos de la pelvis y a la modificacin del peso y a los msculos que soportan el equilibrio.  Sus mamas seguirn desarrollndose y estarn ms sensibles.  Las encas pueden sangrar y estar sensibles al cepillado y al hilo dental.  Pueden aparecer en el rostro zonas oscuras o manchas (cloasma, mscara del embarazo). Esto  desaparece despus del nacimiento del beb.  Es posible que se formeuna lnea oscura desde el ombligo a la zona del pubis (linea nigra). Esto desaparece despus del nacimiento del beb. QU DEBE ESPERAR EN LAS CONSULTAS PRENATALES  Durante una visita prenatal de rutina:   La pesarn para verificar que usted y el feto se encuentran dentro de los lmites normales.  Le tomarn la presin arterial.  Le medirn el abdomen para verificar el desarrollo del beb.  Escucharn los latidos fetales.  Se evaluarn los resultados de los estudios realizados en visitas anteriores. El mdico le preguntar:   Cmo se siente.  Si siente los movimientos del beb.  Si tiene sntomas anormales, como prdida de lquido, sangrado, dolores de cabeza intensos o clicos abdominales.  Si tiene alguna duda. Otros estudios que podrn realizarse durante el segundo trimestre son:   Anlisis de sangre para evaluar:  Niveles bajos de hierro (anemia).  Diabetes gestacional (entre las 24 y las 28 semanas).  Anticuerpos Rh.  Anlisis de orina para detectar infecciones, diabetes o protenas en la orina.  Una ecografa para confirmar si el crecimiento y el desarrollo del beb son los adecuados.  Una amniocentesis para diagnosticar posibles problemas genticos.  Estudios del feto para descartar espina bfida y sndrome de [place]. INSTRUCCIONES PARA EL CUIDADO EN EL HOGAR   Evite fumar, consumir hierbas, beber alcohol y utilizar frmacos que no ne hayan recetado. Estas sustancias qumicas afectan la formacin y el desarrollo del beb.  Siga las indicaciones del profesional con respecto a como tomar los medicamentos. Durante el embarazo, hay   medicamentos que son seguros y otros no lo son.  Realice actividad fsica slo segn las indicaciones del mdico. Sentir clicos uterinos es el mejor signo para detener la actividad fsica.  Contine haciendo comidas regulares y sanas.  Use un sostn que le brinde buen  soporte si sus mamas estn sensibles.  No utilice la baera con agua caliente, baos turcos y saunas.  Colquese el cinturn de seguridad cuando conduzca.  Evite comer carne cruda y el contacto con los utensilios y desperdicios de los gatos. Estos elementos contienen grmenes que pueden causar defectos de nacimiento en el beb.  Tome las vitaminas indicadas para la etapa prenatal.  Pruebe un laxante (si el mdico la autoriza) si tiene constipacin. Consuma ms alimentos ricos en fibra, como vegetales y frutas frescos y cereales enteros. Beba gran cantidad de lquido para mantener la orina de tono claro o amarillo plido.  Tome baos de agua tibia para calmar el dolor o las molestias causadas por las hemorroides. Use una crema para las hemorroides si el mdico la autoriza.  Si tiene venas varicosas, use medias de soporte. Eleve los pies durante 15 minutos, 3 o 4 veces por da. Limite el consumo de sal en su dieta.  Evite levantar objetos pesados, use zapatos de tacones bajos y mantenga una buena postura.  Descanse con las piernas elevadas si tiene calambres o dolor de cintura.  Visite a su dentista si no lo ha hecho durante el embarazo. Use un cepillo de dientes blando para higienizarse los dientes y use suavemente el hilo dental.  Puede continuar su vida sexual siempre que el mdico la autorice.  Concurra a todas las visitas prenatales segn las indicaciones de su mdico. SOLICITE ATENCIN MDICA SI:   Tiene mareos.  Siente clicos leves, presin en la pelvis o dolor persistente en el abdomen.  Tiene nuseas o vmitos o diarrea persistentes.  Observa una secrecin vaginal con mal olor.  Siente dolor al orinar. SOLICITE ATENCIN MDICA DE INMEDIATO SI:   Tiene fiebre.  Pierde lquido por la vagina.  Tiene sangrando o pequeas prdidas vaginales.  Siente dolor intenso o clicos en el abdomen.  Sube o baja de peso rpidamente.  Tiene dificultad para respirar y le duele  pecho.  Sbitamente se le hincha el rostro, las manos, los tobillos, los pies o las piernas de manera extrema.  No ha sentido los movimientos del beb durante una hora.  Siente un dolor de cabeza intenso que no se alivia con medicamentos.  Su visin se modifica. Document Released: 01/08/2005 Document Revised: 12/01/2012 [employer] Patient Information 2014 [employer].  

## 2013-08-23 ENCOUNTER — Ambulatory Visit (INDEPENDENT_AMBULATORY_CARE_PROVIDER_SITE_OTHER): Payer: Self-pay | Admitting: *Deleted

## 2013-08-23 MED ORDER — BETAMETHASONE SOD PHOS & ACET 6 (3-3) MG/ML IJ SUSP
12.0000 mg | Freq: Once | INTRAMUSCULAR | Status: AC
  Administered 2013-08-23: 12 mg via INTRAMUSCULAR

## 2013-08-23 NOTE — Progress Notes (Signed)
2nd dose of Betamethasone injection given.

## 2013-08-24 ENCOUNTER — Other Ambulatory Visit: Payer: Self-pay | Admitting: Family Medicine

## 2013-08-24 ENCOUNTER — Encounter: Payer: Self-pay | Admitting: *Deleted

## 2013-08-24 ENCOUNTER — Encounter: Payer: Self-pay | Admitting: Family Medicine

## 2013-08-24 DIAGNOSIS — O24919 Unspecified diabetes mellitus in pregnancy, unspecified trimester: Secondary | ICD-10-CM

## 2013-08-24 DIAGNOSIS — Z8751 Personal history of pre-term labor: Secondary | ICD-10-CM

## 2013-08-24 DIAGNOSIS — O34219 Maternal care for unspecified type scar from previous cesarean delivery: Secondary | ICD-10-CM

## 2013-08-24 DIAGNOSIS — O09529 Supervision of elderly multigravida, unspecified trimester: Secondary | ICD-10-CM

## 2013-08-29 ENCOUNTER — Ambulatory Visit (INDEPENDENT_AMBULATORY_CARE_PROVIDER_SITE_OTHER): Payer: Self-pay | Admitting: Obstetrics & Gynecology

## 2013-08-29 ENCOUNTER — Encounter (HOSPITAL_COMMUNITY): Payer: Self-pay

## 2013-08-29 ENCOUNTER — Other Ambulatory Visit: Payer: Self-pay | Admitting: Family Medicine

## 2013-08-29 ENCOUNTER — Ambulatory Visit (HOSPITAL_COMMUNITY)
Admission: RE | Admit: 2013-08-29 | Discharge: 2013-08-29 | Disposition: A | Discharge status: Home / Self Care | Payer: Medicaid Other | Source: Ambulatory Visit | Attending: Family Medicine | Admitting: Family Medicine

## 2013-08-29 ENCOUNTER — Inpatient Hospital Stay (HOSPITAL_COMMUNITY)
Admission: AD | Admit: 2013-08-29 | Discharge: 2013-08-30 | DRG: 781 | Disposition: A | Discharge status: Home / Self Care | Payer: Self-pay | Source: Ambulatory Visit | Attending: Obstetrics & Gynecology | Admitting: Obstetrics & Gynecology

## 2013-08-29 VITALS — BP 112/77 | HR 106 | Temp 97.4°F | Wt 160.2 lb

## 2013-08-29 DIAGNOSIS — E119 Type 2 diabetes mellitus without complications: Secondary | ICD-10-CM

## 2013-08-29 DIAGNOSIS — O34219 Maternal care for unspecified type scar from previous cesarean delivery: Secondary | ICD-10-CM

## 2013-08-29 DIAGNOSIS — Z8751 Personal history of pre-term labor: Secondary | ICD-10-CM

## 2013-08-29 DIAGNOSIS — O24919 Unspecified diabetes mellitus in pregnancy, unspecified trimester: Secondary | ICD-10-CM | POA: Diagnosis present

## 2013-08-29 DIAGNOSIS — Z794 Long term (current) use of insulin: Secondary | ICD-10-CM

## 2013-08-29 DIAGNOSIS — O26879 Cervical shortening, unspecified trimester: Principal | ICD-10-CM | POA: Diagnosis present

## 2013-08-29 DIAGNOSIS — O09529 Supervision of elderly multigravida, unspecified trimester: Secondary | ICD-10-CM

## 2013-08-29 DIAGNOSIS — O09219 Supervision of pregnancy with history of pre-term labor, unspecified trimester: Secondary | ICD-10-CM

## 2013-08-29 DIAGNOSIS — O24319 Unspecified pre-existing diabetes mellitus in pregnancy, unspecified trimester: Secondary | ICD-10-CM

## 2013-08-29 DIAGNOSIS — Z833 Family history of diabetes mellitus: Secondary | ICD-10-CM

## 2013-08-29 LAB — POCT URINALYSIS DIP (DEVICE)
Bilirubin Urine: NEGATIVE
Glucose, UA: NEGATIVE mg/dL
Leukocytes, UA: NEGATIVE
NITRITE: NEGATIVE
PROTEIN: NEGATIVE mg/dL
SPECIFIC GRAVITY, URINE: 1.025 (ref 1.005–1.030)
UROBILINOGEN UA: 0.2 mg/dL (ref 0.0–1.0)
pH: 7 (ref 5.0–8.0)

## 2013-08-29 LAB — CBC
HEMATOCRIT: 32.8 % — AB (ref 36.0–46.0)
Hemoglobin: 11.2 g/dL — ABNORMAL LOW (ref 12.0–15.0)
MCH: 31.9 pg (ref 26.0–34.0)
MCHC: 34.1 g/dL (ref 30.0–36.0)
MCV: 93.4 fL (ref 78.0–100.0)
Platelets: 240 10*3/uL (ref 150–400)
RBC: 3.51 MIL/uL — ABNORMAL LOW (ref 3.87–5.11)
RDW: 13.3 % (ref 11.5–15.5)
WBC: 13.4 10*3/uL — ABNORMAL HIGH (ref 4.0–10.5)

## 2013-08-29 MED ORDER — INSULIN REGULAR HUMAN 100 UNIT/ML IJ SOLN
10.0000 [IU] | Freq: Every day | INTRAMUSCULAR | Status: DC
  Administered 2013-08-30: 10 [IU] via SUBCUTANEOUS

## 2013-08-29 MED ORDER — INSULIN NPH (HUMAN) (ISOPHANE) 100 UNIT/ML ~~LOC~~ SUSP
10.0000 [IU] | Freq: Every day | SUBCUTANEOUS | Status: DC
  Administered 2013-08-29: 10 [IU] via SUBCUTANEOUS
  Filled 2013-08-29: qty 10

## 2013-08-29 MED ORDER — ZOLPIDEM TARTRATE 5 MG PO TABS: 5.0000 mg | ORAL_TABLET | Freq: Every evening | ORAL | Status: DC | PRN

## 2013-08-29 MED ORDER — ACETAMINOPHEN 325 MG PO TABS: 650.0000 mg | ORAL_TABLET | ORAL | Status: DC | PRN

## 2013-08-29 MED ORDER — PRENATAL MULTIVITAMIN CH: 1.0000 | ORAL_TABLET | Freq: Every day | ORAL | Status: DC

## 2013-08-29 MED ORDER — INSULIN REGULAR HUMAN 100 UNIT/ML IJ SOLN
8.0000 [IU] | Freq: Every day | INTRAMUSCULAR | Status: DC
  Administered 2013-08-29: 8 [IU] via SUBCUTANEOUS
  Filled 2013-08-29: qty 0.08

## 2013-08-29 MED ORDER — DOCUSATE SODIUM 100 MG PO CAPS
100.0000 mg | ORAL_CAPSULE | Freq: Every day | ORAL | Status: DC
  Administered 2013-08-30: 100 mg via ORAL
  Filled 2013-08-29 (×2): qty 1

## 2013-08-29 MED ORDER — PRENATAL MULTIVITAMIN CH
1.0000 | ORAL_TABLET | Freq: Every day | ORAL | Status: DC
  Administered 2013-08-30: 1 via ORAL
  Filled 2013-08-29: qty 1

## 2013-08-29 MED ORDER — HYDROXYPROGESTERONE CAPROATE 250 MG/ML IM OIL: 250.0000 mg | TOPICAL_OIL | INTRAMUSCULAR | Status: DC

## 2013-08-29 MED ORDER — CALCIUM CARBONATE ANTACID 500 MG PO CHEW: 2.0000 | CHEWABLE_TABLET | ORAL | Status: DC | PRN

## 2013-08-29 MED ORDER — INSULIN NPH (HUMAN) (ISOPHANE) 100 UNIT/ML ~~LOC~~ SUSP
20.0000 [IU] | Freq: Every day | SUBCUTANEOUS | Status: DC
  Administered 2013-08-30: 20 [IU] via SUBCUTANEOUS

## 2013-08-29 MED ORDER — INSULIN REGULAR HUMAN 100 UNIT/ML IJ SOLN: 8.0000 [IU] | Freq: Two times a day (BID) | INTRAMUSCULAR | Status: DC

## 2013-08-29 MED ORDER — INSULIN NPH (HUMAN) (ISOPHANE) 100 UNIT/ML ~~LOC~~ SUSP
10.0000 [IU] | Freq: Two times a day (BID) | SUBCUTANEOUS | Status: DC
Start: 2013-08-29 — End: 2013-08-29

## 2013-08-29 MED ORDER — PROGESTERONE MICRONIZED 200 MG PO CAPS
200.0000 mg | ORAL_CAPSULE | Freq: Every day | ORAL | Status: DC
  Administered 2013-08-29: 200 mg via VAGINAL
  Filled 2013-08-29: qty 1

## 2013-08-29 NOTE — H&P (Signed)
Attestation of Attending Supervision of Fellow: Evaluation and management procedures were performed by the Fellow under my supervision and collaboration.  I have reviewed the Fellow's note and chart, and I agree with the management and plan.    

## 2013-08-29 NOTE — H&P (Signed)
Rachael Russo is a 38 y.o. female [phone] with IUP at 2768w6d presenting from MFM for shortening cervix for observation. On 5/11 found to have shortened cervix and was recommended to start prometrium 200mg  PV and rec steroids - 5/11 and 5/12. Pt doing well now with no complaints. Pt states fetus is moving, no contractions, no lof and no vb. No other complaints at this time.   [facility]  Prenatal History/Complications: AMA Hx of UTI,  Hx of VBAC at 34 wks on 17wk Type 2 diabetes on insulin   Clinic [facility]  Dating LMP:    sure             Ultrasound: 10  weeks        Ultrasound consistent with LMP: Yes + 2d  Genetic Screen Quad: Normal  Anatomic US  limited, repeat at 24 wks  GTT Class B  TDaP vaccine   Flu vaccine 05/09/13  GBS   Baby Food Breast  Contraception Undecided  Circumcision No  Pediatrician [name]  Support Person FOB    Past Medical History: Past Medical History  Diagnosis Date  . Diabetes mellitus   . AMA (advanced maternal age) multigravida 35+ 05/09/2013    Offered genetic screening, discussed options, patient desires quad screen Clinic [facility]  Dating LMP consistent with 10 week scan  Genetic Screen  Quad:                  NIPS:  Anatomic US   GTT Type II DM  TDaP vaccine   Flu vaccine   GBS   Baby Food Breast  Contraception Condoms  Pediatrician [name]  Support Person Mother        Past Surgical History: Past Surgical History  Procedure Laterality Date  . Cesarean section      Obstetrical History: OB History   Grav Para Term Preterm Abortions TAB SAB Ect Mult Living   4 3 1 2  0     2      Gynecological History: OB History   Grav Para Term Preterm Abortions TAB SAB Ect Mult Living   4 3 1 2  0     2      Social History: History   Social History  . Marital Status: Married    Spouse Name: N/A    Number of Children: N/A  . Years of Education: N/A   Social History Main Topics  . Smoking status: Never Smoker    . Smokeless tobacco: Not on file  . Alcohol Use: No  . Drug Use: No  . Sexual Activity: Yes    Birth Control/ Protection: Condom   Other Topics Concern  . Not on file   Social History Narrative  . No narrative on file    Family History: Family History  Problem Relation Age of Onset  . Diabetes Mother     Allergies: No Known Allergies  Facility-administered medications prior to admission  Medication Dose Route Frequency Provider Last Rate Last Dose  . hydroxyprogesterone caproate (DELALUTIN) 250 mg/mL injection 250 mg  250 mg Intramuscular Weekly Vale HavenKeli L Beck, MD   250 mg at 08/29/13 0848   Prescriptions prior to admission  Medication Sig Dispense Refill  . insulin NPH Human (HUMULIN N,NOVOLIN N) 100 UNIT/ML injection Inject 14-22 Units into the skin 2 (two) times daily before a meal. 22u qam, 16 u qhs      . Prenatal Vit-Fe Fumarate-FA (PRENATAL MULTIVITAMIN) TABS tablet Take  1 tablet by mouth daily at 12 noon.      . progesterone (PROMETRIUM) 200 MG capsule Place 1 capsule (200 mg total) vaginally at bedtime.  30 capsule  5  . insulin regular (NOVOLIN R,HUMULIN R) 100 units/mL injection Inject 8-12 Units into the skin 2 (two) times daily before a meal. 12u qm, 10 u qac dinner         Review of Systems   Constitutional: no complaints  Last menstrual period 03/08/2013. General appearance: alert, cooperative, appears stated age and no distress Lungs: clear to auscultation bilaterally Heart: regular rate and rhythm Abdomen: soft, non-tender; bowel sounds normal Pelvic: not examined Extremities: Homans sign is negative, no sign of DVT DTR's 2+ Presentation: transverse by US today Fetal monitoringBaseline: 130s bpm, Variability: Good {> 6 bpm), Accelerations: Reactive and Decelerations: Absent Uterine activityNone     Prenatal labs: ABO, Rh: --/--/O POS (04/04 1125) Antibody: NEG (01/14 0840) Rubella:   RPR: NON REAC (01/14 0840)  HBsAg: NEGATIVE (01/14 0840)   HIV: NON REACTIVE (01/14 0840)  GBS:       Prenatal Transfer Tool  Maternal Diabetes: Yes:  Diabetes Type:  Insulin/Medication controlled Genetic Screening: Normal Maternal Ultrasounds/Referrals: Abnormal:  Findings:   Other:shortened cervix Fetal Ultrasounds or other Referrals:  Fetal echo Maternal Substance Abuse:  No Significant Maternal Medications:  Meds include: Other: insulin, prometrium, 17p,  Significant Maternal Lab Results: None     Results for orders placed in visit on 08/29/13 (from the past 24 hour(s))  POCT URINALYSIS DIP (DEVICE)   Collection Time    08/29/13  8:12 AM      Result Value Ref Range   Glucose, UA NEGATIVE  NEGATIVE mg/dL   Bilirubin Urine NEGATIVE  NEGATIVE   Ketones, ur TRACE (*) NEGATIVE mg/dL   Specific Gravity, Urine 1.025  1.005 - 1.030   Hgb urine dipstick TRACE (*) NEGATIVE   pH 7.0  5.0 - 8.0   Protein, ur NEGATIVE  NEGATIVE mg/dL   Urobilinogen, UA 0.2  0.0 - 1.0 mg/dL   Nitrite NEGATIVE  NEGATIVE   Leukocytes, UA NEGATIVE  NEGATIVE    Assessment: Rachael Russo is a 38 y.o. [id number] at 608w6d by R=10 here for shortened cervix by MFM recommendatsion  #Shortened cervix: continue 17p, prometrium and bed rest at this time. Will discuss with MFM to consider additional intervention #DM2: continue home insulin (dec 10%) at this time and continue to check sugars #FWB: Reactive and reassuring, Transverse  Dispo: pending MFM recommendations  Minta BalsamMichael R Suresh Audi 08/29/2013, 1:46 PM

## 2013-08-29 NOTE — Patient Instructions (Signed)
Informacin sobre el parto prematuro  (Preterm Labor Information) El parto prematuro comienza antes de la semana 37 de embarazo. La duracin de un embarazo normal es de 39 a 41 semanas.  CAUSAS  Generalmente no hay una causa que pueda identificarse del motivo por el que una mujer comienza un trabajo de parto prematuro. Sin embargo, una de las causas conocidas ms frecuentes son las infecciones. Las infecciones del tero, el cuello, la vagina, el lquido amnitico, la vejiga, los riones y hasta de los pulmones (neumona) pueden hacer que el trabajo de parto se inicie. Otras causas que pueden sospecharse son:   Infecciones urogenitales, como infecciones por hongos y vaginosis bacteriana.   Anormalidades uterinas (forma del tero, sptum uterino, fibromas, hemorragias en la placenta).   Un cuello que ha sido operado (puede ser que no permanezca cerrado).   Malformaciones del feto.   Gestaciones mltiples (mellizos, trillizos y ms).   Ruptura del saco amnitico.  FACTORES DE RIESGO   Historia previa de parto prematuro.   Tener ruptura prematura de las membranas (RPM).   La placenta cubre la abertura del cuello (placenta previa).   La placenta se separa del tero (abrupcin placentaria).   El cuello es demasiado dbil para contener al beb en el tero (cuello incompetente).   Hay mucho lquido en el saco amnitico (polihidramnios).   Consumo de drogas o hbito de fumar durante el embarazo.   No aumentar de peso lo suficiente durante el embarazo.   Mujeres menores de 18 aos o mayores de 35 aos.   Nivel socioeconmico bajo.   Pertenecer a la raza afroamericana. SNTOMAS  Los signos y sntomas del trabajo de parto prematuro son:   Clicos similares a los menstruales, dolor abdominal o dolor de espalda.  Contracciones uterinas regulares, tan frecuentes como seis por hora, sin importar su intensidad (pueden ser suaves o dolorosas).  Contracciones que comienzan  en la parte superior del tero y se expanden hacia abajo, a la zona inferior del abdomen y la espalda.   Sensacin de aumento de presin en la pelvis.   Aparece una secrecin acuosa o sanguinolenta por la vagina.  TRATAMIENTO  Segn el tiempo del embarazo y otras circunstancias, el mdico puede indicar reposo en cama. Si es necesario, le indicarn medicamentos para detener las contracciones y para madurar los pulmones del feto. Si el trabajo de parto se inicia antes de las 34 semanas de embarazo, se recomienda la hospitalizacin. El tratamiento depende de las condiciones en que se encuentren usted y el feto.  QU DEBE HACER SI PIENSA QUE EST EN TRABAJO DE PARTO PREMATURO?  Comunquese con su mdico inmediatamente. Debe concurrir al hospital para ser controlada inmediatamente.  CMO PUEDE EVITAR EL TRABAJO DE PARTO PREMATURO EN FUTUROS EMBARAZOS?  Usted debe:   Si fuma, abandonar el hbito.  Mantener un peso saludable y evitar sustancias qumicas y drogas innecesarias.  Controlar todo tipo de infeccin.  Informe a su mdico si tiene una historia conocida de trabajo de parto prematuro. Document Released: 07/08/2007 Document Revised: 12/01/2012 ExitCare Patient Information 2014 ExitCare, LLC.  

## 2013-08-29 NOTE — Progress Notes (Signed)
I visited with Rachael Russo and her [place] in [place], as their English was limited.  Cadience told me about her 3 other children, 2 sons, age 38 and 6010 and 1 daughter whom they lost at 3218 weeks gestation in 2008.  They had not planned on having another child, and given her history of preterm labor and loss, she is reluctant to really attach to this baby because she knows the pain involved.    Much of [place] family [place] in [place] and they [place] any family [place].  All of her children [facility] and it is painful to be back at the hospital where she experienced the loss of her daughter.    We will continue to provide support to her when we are able, but please also page as needs arise or as pt or family requests.  Centex CorporationChaplain Katy Rivaan Kendall Pager, [phone] 3:09 PM   08/29/13 1500  Clinical Encounter Type  Visited With Patient and family together  Visit Type Spiritual support  Referral From Nurse  Spiritual Encounters  Spiritual Needs Emotional

## 2013-08-29 NOTE — Progress Notes (Signed)
Reports occasional brown d/c, usually sees when she wakes up in the morning

## 2013-08-29 NOTE — Progress Notes (Signed)
F 85-95, pp 98-125, continue present insulin  US scheduled today f/u cx shortened and funneling. Has started vaginal progesterone, received betamethasone x2

## 2013-08-30 DIAGNOSIS — O24919 Unspecified diabetes mellitus in pregnancy, unspecified trimester: Secondary | ICD-10-CM

## 2013-08-30 DIAGNOSIS — O26879 Cervical shortening, unspecified trimester: Principal | ICD-10-CM

## 2013-08-30 DIAGNOSIS — E119 Type 2 diabetes mellitus without complications: Secondary | ICD-10-CM

## 2013-08-30 DIAGNOSIS — Z794 Long term (current) use of insulin: Secondary | ICD-10-CM

## 2013-08-30 LAB — GLUCOSE, CAPILLARY: Glucose-Capillary: 119 mg/dL — ABNORMAL HIGH (ref 70–99)

## 2013-08-30 MED ORDER — POLYETHYLENE GLYCOL 3350 17 G PO PACK
17.0000 g | PACK | Freq: Every day | ORAL | Status: DC
  Filled 2013-08-30: qty 1

## 2013-08-30 MED ORDER — DSS 100 MG PO CAPS: 100.0000 mg | ORAL_CAPSULE | Freq: Every day | ORAL | Status: DC

## 2013-08-30 NOTE — Discharge Summary (Signed)
Physician Discharge Summary  Patient ID: Rachael Russo MRN: [id number] DOB/AGE: Aug 02, 1975 37 y.o.  Admit date: 08/29/2013 Discharge date: 08/30/2013  Admission Diagnoses:  Cervical incompetence  Discharge Diagnoses:  Active Problems:   Short cervix affecting pregnancy   Discharged Condition: good  Hospital Course: 38 yo female was admitted for overnight evaluation for worsening cervical shortening.  Membranes are at level of external os on US.  Pt was placed on fetal montoring and there were no contractions.  Pt remains asymptomatic.Marland Kitchen.  Pt continued Prometrium, Makena, and diabetes regimen.  There were no issues overnight and cervix remains closed and short.  Consults: None  Significant Diagnostic Studies: US as outpat  Treatments: none  Discharge Exam: Blood pressure 102/66, pulse 91, temperature 98.2 F (36.8 C), temperature source Oral, resp. rate 18, height 5' (1.524 m), weight 161 lb (73.029 kg), last menstrual period 03/08/2013. General appearance: alert, cooperative and no distress GI: gravid, nontender Extremities: extremities normal, atraumatic, no cyanosis or edema cervix:  No membranes prolapsing, cervix short and soft.    Disposition: 01-Home or Self Care     Medication List         DSS 100 MG Caps  Take 100 mg by mouth daily.     insulin NPH Human 100 UNIT/ML injection  Commonly known as:  HUMULIN N,NOVOLIN N  Inject 14-22 Units into the skin 2 (two) times daily before a meal. 22u qam, 16 u qhs     insulin regular 100 units/mL injection  Commonly known as:  NOVOLIN R,HUMULIN R  Inject 8-12 Units into the skin 2 (two) times daily before a meal. 12u qm, 10 u qac dinner     prenatal multivitamin Tabs tablet  Take 1 tablet by mouth daily at 12 noon.     progesterone 200 MG capsule  Commonly known as:  PROMETRIUM  Place 1 capsule (200 mg total) vaginally at bedtime.           Follow-up Information   Follow up In 1 week. (ONe week to clinci and f/u  with MFM as they determine.)       Signed: Lesly DukesKelly H Monalisa Bayless 08/30/2013, 7:50 AM

## 2013-08-30 NOTE — Progress Notes (Signed)
Eda,spanish interpreter at bedside

## 2013-08-30 NOTE — Discharge Instructions (Signed)
Informacin sobre el parto prematuro  (Preterm Labor Information) El parto prematuro comienza antes de la semana 37 de embarazo. La duracin de un embarazo normal es de 39 a 41 semanas.  CAUSAS  Generalmente no hay una causa que pueda identificarse del motivo por el que una mujer comienza un trabajo de parto prematuro. Sin embargo, una de las causas conocidas ms frecuentes son las infecciones. Las infecciones del tero, el cuello, la vagina, el lquido amnitico, la vejiga, los riones y hasta de los pulmones (neumona) pueden hacer que el trabajo de parto se inicie. Otras causas que pueden sospecharse son:   Infecciones urogenitales, como infecciones por hongos y vaginosis bacteriana.   Anormalidades uterinas (forma del tero, sptum uterino, fibromas, hemorragias en la placenta).   Un cuello que ha sido operado (puede ser que no permanezca cerrado).   Malformaciones del feto.   Gestaciones mltiples (mellizos, trillizos y ms).   Ruptura del saco amnitico.  FACTORES DE RIESGO   Historia previa de parto prematuro.   Tener ruptura prematura de las membranas (RPM).   La placenta cubre la abertura del cuello (placenta previa).   La placenta se separa del tero (abrupcin placentaria).   El cuello es demasiado dbil para contener al beb en el tero (cuello incompetente).   Hay mucho lquido en el saco amnitico (polihidramnios).   Consumo de drogas o hbito de fumar durante el embarazo.   No aumentar de peso lo suficiente durante el embarazo.   Mujeres menores de 18 aos o mayores de 35 aos.   Nivel socioeconmico bajo.   Pertenecer a la raza afroamericana. SNTOMAS  Los signos y sntomas del trabajo de parto prematuro son:   Clicos similares a los menstruales, dolor abdominal o dolor de espalda.  Contracciones uterinas regulares, tan frecuentes como seis por hora, sin importar su intensidad (pueden ser suaves o dolorosas).  Contracciones que comienzan  en la parte superior del tero y se expanden hacia abajo, a la zona inferior del abdomen y la espalda.   Sensacin de aumento de presin en la pelvis.   Aparece una secrecin acuosa o sanguinolenta por la vagina.  TRATAMIENTO  Segn el tiempo del embarazo y otras circunstancias, el mdico puede indicar reposo en cama. Si es necesario, le indicarn medicamentos para detener las contracciones y para madurar los pulmones del feto. Si el trabajo de parto se inicia antes de las 34 semanas de embarazo, se recomienda la hospitalizacin. El tratamiento depende de las condiciones en que se encuentren usted y el feto.  QU DEBE HACER SI PIENSA QUE EST EN TRABAJO DE PARTO PREMATURO?  Comunquese con su mdico inmediatamente. Debe concurrir al hospital para ser controlada inmediatamente.  CMO PUEDE EVITAR EL TRABAJO DE PARTO PREMATURO EN FUTUROS EMBARAZOS?  Usted debe:   Si fuma, abandonar el hbito.  Mantener un peso saludable y evitar sustancias qumicas y drogas innecesarias.  Controlar todo tipo de infeccin.  Informe a su mdico si tiene una historia conocida de trabajo de parto prematuro. Document Released: 07/08/2007 Document Revised: 12/01/2012 ExitCare Patient Information 2014 ExitCare, LLC.  

## 2013-08-30 NOTE — Progress Notes (Addendum)
Dc instructions given to pt and fob with [facility] [place] interpreter no questions or concerns noted

## 2013-09-06 ENCOUNTER — Other Ambulatory Visit: Payer: Self-pay | Admitting: Family Medicine

## 2013-09-06 ENCOUNTER — Ambulatory Visit (INDEPENDENT_AMBULATORY_CARE_PROVIDER_SITE_OTHER): Payer: Self-pay | Admitting: *Deleted

## 2013-09-06 VITALS — BP 118/72 | HR 90 | Temp 98.7°F | Wt 162.1 lb

## 2013-09-06 DIAGNOSIS — O09219 Supervision of pregnancy with history of pre-term labor, unspecified trimester: Secondary | ICD-10-CM

## 2013-09-06 DIAGNOSIS — Z8751 Personal history of pre-term labor: Secondary | ICD-10-CM

## 2013-09-06 DIAGNOSIS — O34219 Maternal care for unspecified type scar from previous cesarean delivery: Secondary | ICD-10-CM

## 2013-09-06 DIAGNOSIS — O3430 Maternal care for cervical incompetence, unspecified trimester: Secondary | ICD-10-CM

## 2013-09-06 DIAGNOSIS — O24919 Unspecified diabetes mellitus in pregnancy, unspecified trimester: Secondary | ICD-10-CM

## 2013-09-06 DIAGNOSIS — O09529 Supervision of elderly multigravida, unspecified trimester: Secondary | ICD-10-CM

## 2013-09-07 ENCOUNTER — Ambulatory Visit (HOSPITAL_COMMUNITY)
Admit: 2013-09-07 | Discharge: 2013-09-07 | Disposition: A | Discharge status: Home / Self Care | Payer: MEDICAID | Attending: Family Medicine | Admitting: Family Medicine

## 2013-09-07 VITALS — BP 111/66 | HR 92 | Wt 163.0 lb

## 2013-09-07 DIAGNOSIS — O26879 Cervical shortening, unspecified trimester: Secondary | ICD-10-CM

## 2013-09-07 DIAGNOSIS — O24319 Unspecified pre-existing diabetes mellitus in pregnancy, unspecified trimester: Secondary | ICD-10-CM

## 2013-09-07 DIAGNOSIS — O3430 Maternal care for cervical incompetence, unspecified trimester: Secondary | ICD-10-CM

## 2013-09-07 DIAGNOSIS — Z789 Other specified health status: Secondary | ICD-10-CM

## 2013-09-07 DIAGNOSIS — O0990 Supervision of high risk pregnancy, unspecified, unspecified trimester: Secondary | ICD-10-CM

## 2013-09-07 DIAGNOSIS — Z8751 Personal history of pre-term labor: Secondary | ICD-10-CM | POA: Insufficient documentation

## 2013-09-07 DIAGNOSIS — O24919 Unspecified diabetes mellitus in pregnancy, unspecified trimester: Secondary | ICD-10-CM

## 2013-09-07 DIAGNOSIS — O09219 Supervision of pregnancy with history of pre-term labor, unspecified trimester: Secondary | ICD-10-CM

## 2013-09-07 DIAGNOSIS — O34219 Maternal care for unspecified type scar from previous cesarean delivery: Secondary | ICD-10-CM | POA: Insufficient documentation

## 2013-09-07 DIAGNOSIS — O09529 Supervision of elderly multigravida, unspecified trimester: Secondary | ICD-10-CM

## 2013-09-12 ENCOUNTER — Ambulatory Visit (INDEPENDENT_AMBULATORY_CARE_PROVIDER_SITE_OTHER): Payer: Self-pay | Admitting: Family Medicine

## 2013-09-12 VITALS — BP 119/76 | HR 103 | Temp 97.9°F | Wt 163.6 lb

## 2013-09-12 DIAGNOSIS — O26879 Cervical shortening, unspecified trimester: Secondary | ICD-10-CM

## 2013-09-12 DIAGNOSIS — O24319 Unspecified pre-existing diabetes mellitus in pregnancy, unspecified trimester: Secondary | ICD-10-CM

## 2013-09-12 DIAGNOSIS — O24919 Unspecified diabetes mellitus in pregnancy, unspecified trimester: Secondary | ICD-10-CM

## 2013-09-12 DIAGNOSIS — O09529 Supervision of elderly multigravida, unspecified trimester: Secondary | ICD-10-CM

## 2013-09-12 DIAGNOSIS — O0990 Supervision of high risk pregnancy, unspecified, unspecified trimester: Secondary | ICD-10-CM

## 2013-09-12 DIAGNOSIS — O34219 Maternal care for unspecified type scar from previous cesarean delivery: Secondary | ICD-10-CM

## 2013-09-12 DIAGNOSIS — O09219 Supervision of pregnancy with history of pre-term labor, unspecified trimester: Secondary | ICD-10-CM

## 2013-09-12 DIAGNOSIS — E119 Type 2 diabetes mellitus without complications: Secondary | ICD-10-CM

## 2013-09-12 LAB — POCT URINALYSIS DIP (DEVICE)
Bilirubin Urine: NEGATIVE
GLUCOSE, UA: 500 mg/dL — AB
Hgb urine dipstick: NEGATIVE
LEUKOCYTES UA: NEGATIVE
NITRITE: NEGATIVE
Protein, ur: NEGATIVE mg/dL
Specific Gravity, Urine: 1.02 (ref 1.005–1.030)
Urobilinogen, UA: 0.2 mg/dL (ref 0.0–1.0)
pH: 7 (ref 5.0–8.0)

## 2013-09-12 NOTE — Patient Instructions (Addendum)
18U of NPH in the PM  [place] trimestre del Psychiatrist  (Second Trimester of Pregnancy) El segundo trimestre del Psychiatrist se extiende desde la semana 13 hasta la semana 28, del 4 al 6 mes. En general, es el momento del embarazo en el que se sentir mejor. Su organismo se ha adaptado a Charity fundraiser y comienza a Diplomatic Services operational officer. En general las nuseas matutinas han disminuido o han desaparecido completamente. El segundo trimestre es tambin la poca en la que el feto se desarrolla rpidamente. Hacia el final del sexto mes, el beb mide aproximadamente 9 pulgadas (23 cm) y pesa alrededor de 1  libras (700 g). Es probable que Architectural technologist al beb (dar pataditas) entre las 18 y 20 semanas del Psychiatrist.  CAMBIOS CORPORALES  Su organismo atravesar numerosos cambios durante el [place]. Los cambios varan de una mujer a Educational psychologist.   Seguir [employer]. Notar que la parte baja del abdomen se ensancha.  Podrn aparecer las primeras estras en las caderas, abdomen y [place].  Es posible que sienta cefaleas, que se pueden Paramedic con los medicamentos que su mdico le autorice a Chemical engineer.  Tendr necesidad de orinar con ms frecuencia porque el feto est presionando en la vejiga.  Como consecuencia del Psychiatrist, podr sentir Actor.  Podr estar constipada ya que ciertas hormonas hacen que los msculos que [employer] desechos a travs de los intestinos trabajen ms lentamente.  Pueden aparecer hemorroides o abultarse las venas (venas varicosas).  El dolor de espalda se debe al aumento de peso y a que las hormonas del Management consultant las articulaciones entre los huesos de la pelvis y a la modificacin del peso y a los msculos que soportan el equilibrio.  Sus mamas seguirn desarrollndose y estarn ms sensibles.  Las Veterinary surgeon y estar sensibles al cepillado y al hilo dental.  Pueden aparecer en el rostro zonas oscuras o manchas ([place],  mscara del [place]). Esto desaparece despus del nacimiento del beb.  Es posible que se formeuna lnea oscura desde el ombligo a la zona del pubis (linea nigra). Esto desaparece despus del nacimiento del beb. QU DEBE ESPERAR EN LAS CONSULTAS PRENATALES  Durante una visita prenatal de rutina:   La pesarn para verificar que usted y el feto se encuentran dentro de los lmites normales.  Le tomarn la presin arterial.  Le medirn el abdomen para verificar el desarrollo del beb.  Escucharn los latidos fetales.  Se evaluarn los resultados de los estudios realizados en visitas anteriores. El mdico le preguntar:   Cmo se siente.  Si siente los movimientos del beb.  Si tiene sntomas anormales, como prdida de lquido, [place], dolores de cabeza intensos o clicos abdominales.  Si tiene [place] duda. Otros estudios que podrn realizarse durante el segundo trimestre son:   Anlisis de sangre para evaluar:  Niveles bajos de hierro (anemia).  Diabetes gestacional (entre las 24 y las 28 [address]).  Anticuerpos Rh.  Anlisis de orina para detectar infecciones, diabetes o protenas en la orina.  Una ecografa para confirmar si el crecimiento y el desarrollo del beb son los [place].  Una amniocentesis para diagnosticar posibles problemas genticos.  Estudios del feto para descartar espina bfida y sndrome de [place]. INSTRUCCIONES PARA EL CUIDADO EN EL HOGAR   Evite fumar, consumir hierbas, beber alcohol y Chemical engineer frmacos que no ne hayan recetado. Estas sustancias qumicas afectan la formacin y el desarrollo del beb.  Siga las indicaciones del profesional con respecto a Public librarian  tomar los medicamentos. Durante el embarazo, hay medicamentos que son seguros y otros no lo son.  Realice actividad fsica slo segn las indicaciones del mdico. Sentir clicos uterinos es el mejor signo para Restaurant manager, fast fooddetener la actividad fsica.  Contine haciendo comidas regulares y sanas.  Use un  sostn que le brinde buen soporte si sus mamas estn sensibles.  No utilice la baera con agua caliente, baos turcos y saunas.  Colquese el cinturn de seguridad cuando conduzca.  Evite comer carne cruda y el contacto con los utensilios y desperdicios de los gatos. Estos elementos contienen grmenes que pueden causar defectos de nacimiento en el beb.  Tome las vitaminas indicadas para la etapa prenatal.  Pruebe un laxante (si el mdico la autoriza) si tiene constipacin. Consuma ms alimentos ricos en fibra, como vegetales y frutas frescos y cereales enteros. Beba gran cantidad de lquido para mantener la orina de tono claro o amarillo plido.  Tome baos de agua tibia para Primary school teachercalmar el dolor o las molestias causadas por las hemorroides. Use una crema para las hemorroides si el mdico la autoriza.  Si tiene venas varicosas, use medias de soporte. Eleve los pies durante 15 minutos, 3 o 4 veces por da. Limite el consumo de sal en su dieta.  Evite levantar objetos pesados, use zapatos de tacones bajos y [place]mantenga una buena postura.  Descanse con las piernas elevadas si tiene calambres o dolor de cintura.  Visite a su dentista si no lo ha Occupational hygienisthecho durante el embarazo. Use un cepillo de dientes blando para higienizarse los dientes y use suavemente el hilo dental.  Puede continuar su vida sexual siempre que el mdico la autorice.  Concurra a todas las visitas prenatales segn las indicaciones de su mdico. SOLICITE ATENCIN MDICA SI:   Santa Generaiene mareos.  Siente clicos leves, presin en la pelvis o dolor persistente en el abdomen.  Tiene nuseas o vmitos o diarrea persistentes.  Brett Fairybserva una secrecin vaginal con mal olor.  Siente dolor al ConocoPhillipsorinar. SOLICITE ATENCIN MDICA DE INMEDIATO SI:   Tiene fiebre.  Pierde lquido por la vagina.  Tiene sangrando o pequeas prdidas vaginales.  Siente dolor intenso o clicos en el abdomen.  Sube o baja de peso rpidamente.  Tiene dificultad  para respirar y Geographical information systems officerle duele pecho.  Sbitamente se le hincha el rostro, las manos, los tobillos, los pies o las piernas de [place] Pe[place]emanera extrema.  No ha sentido los movimientos del beb durante Georgianne Fickuna hora.  Siente un dolor de cabeza intenso que no se alivia con medicamentos.  Su visin se modifica. Document Released: 01/08/2005 Document Revised: 12/01/2012 [facility] Patient Information 2014 [place] Community[place]Care, [place].

## 2013-09-12 NOTE — Progress Notes (Signed)
S: 38 yo [id number] @ [redacted]w[redacted]d her for ROBV HR for AMA, shortened cervix, hx PTD, type 2 DM Shortened cervix- followed by MFM. CL being checked weekly. On PV progesterone Hx PTD- on 17-p (perMFM wanting both prometrium and 17-P) DM- fasting: all in the 90s but low 90s.  2hr- 78-122 (2 above 120)  NPH 22U in AM, 12U of regular NPH 16U in PM, 10U of regular   O: see flowsheet  A/P - cont plans as layed out above  - DM- increase PM NPH from 16 to18U for better fasting control - f/u in 2 weeks

## 2013-09-14 ENCOUNTER — Ambulatory Visit (HOSPITAL_COMMUNITY)
Admission: RE | Admit: 2013-09-14 | Discharge: 2013-09-14 | Disposition: A | Discharge status: Home / Self Care | Payer: Medicaid Other | Source: Ambulatory Visit | Attending: Obstetrics & Gynecology | Admitting: Obstetrics & Gynecology

## 2013-09-14 VITALS — BP 111/66 | HR 89 | Wt 165.0 lb

## 2013-09-14 DIAGNOSIS — O24919 Unspecified diabetes mellitus in pregnancy, unspecified trimester: Secondary | ICD-10-CM | POA: Insufficient documentation

## 2013-09-14 DIAGNOSIS — Z758 Other problems related to medical facilities and other health care: Secondary | ICD-10-CM

## 2013-09-14 DIAGNOSIS — O24319 Unspecified pre-existing diabetes mellitus in pregnancy, unspecified trimester: Secondary | ICD-10-CM

## 2013-09-14 DIAGNOSIS — E119 Type 2 diabetes mellitus without complications: Secondary | ICD-10-CM | POA: Insufficient documentation

## 2013-09-14 DIAGNOSIS — Z789 Other specified health status: Secondary | ICD-10-CM

## 2013-09-14 DIAGNOSIS — Z3689 Encounter for other specified antenatal screening: Secondary | ICD-10-CM | POA: Insufficient documentation

## 2013-09-14 DIAGNOSIS — O26879 Cervical shortening, unspecified trimester: Secondary | ICD-10-CM

## 2013-09-14 DIAGNOSIS — O09529 Supervision of elderly multigravida, unspecified trimester: Secondary | ICD-10-CM

## 2013-09-14 DIAGNOSIS — O09219 Supervision of pregnancy with history of pre-term labor, unspecified trimester: Secondary | ICD-10-CM

## 2013-09-14 DIAGNOSIS — O0990 Supervision of high risk pregnancy, unspecified, unspecified trimester: Secondary | ICD-10-CM

## 2013-09-19 ENCOUNTER — Ambulatory Visit (INDEPENDENT_AMBULATORY_CARE_PROVIDER_SITE_OTHER): Payer: Self-pay

## 2013-09-19 VITALS — BP 107/67 | HR 100 | Temp 97.7°F | Wt 163.5 lb

## 2013-09-19 DIAGNOSIS — O09219 Supervision of pregnancy with history of pre-term labor, unspecified trimester: Secondary | ICD-10-CM

## 2013-09-19 NOTE — Progress Notes (Signed)
Agree with nursing documentation.  

## 2013-09-19 NOTE — Progress Notes (Signed)
Patient here today for 17P injection. Medication administered in LUO buttox. Patient tolerated well. No questions or concerns. Patient to return next week for OB FU and injection.

## 2013-09-21 ENCOUNTER — Encounter: Payer: Self-pay | Admitting: Obstetrics & Gynecology

## 2013-09-21 ENCOUNTER — Encounter (HOSPITAL_COMMUNITY): Payer: Self-pay

## 2013-09-21 ENCOUNTER — Ambulatory Visit (HOSPITAL_COMMUNITY)
Admission: RE | Admit: 2013-09-21 | Discharge: 2013-09-21 | Disposition: A | Discharge status: Home / Self Care | Payer: Medicaid Other | Source: Ambulatory Visit | Attending: Obstetrics & Gynecology | Admitting: Obstetrics & Gynecology

## 2013-09-21 VITALS — BP 119/70 | HR 87 | Wt 166.0 lb

## 2013-09-21 DIAGNOSIS — Z789 Other specified health status: Secondary | ICD-10-CM

## 2013-09-21 DIAGNOSIS — O24919 Unspecified diabetes mellitus in pregnancy, unspecified trimester: Secondary | ICD-10-CM | POA: Insufficient documentation

## 2013-09-21 DIAGNOSIS — E119 Type 2 diabetes mellitus without complications: Secondary | ICD-10-CM | POA: Insufficient documentation

## 2013-09-21 DIAGNOSIS — O09529 Supervision of elderly multigravida, unspecified trimester: Secondary | ICD-10-CM

## 2013-09-21 DIAGNOSIS — O09219 Supervision of pregnancy with history of pre-term labor, unspecified trimester: Secondary | ICD-10-CM

## 2013-09-21 DIAGNOSIS — O0990 Supervision of high risk pregnancy, unspecified, unspecified trimester: Secondary | ICD-10-CM

## 2013-09-21 DIAGNOSIS — Z3689 Encounter for other specified antenatal screening: Secondary | ICD-10-CM | POA: Insufficient documentation

## 2013-09-21 DIAGNOSIS — O26879 Cervical shortening, unspecified trimester: Secondary | ICD-10-CM | POA: Insufficient documentation

## 2013-09-21 DIAGNOSIS — O24319 Unspecified pre-existing diabetes mellitus in pregnancy, unspecified trimester: Secondary | ICD-10-CM

## 2013-09-26 ENCOUNTER — Ambulatory Visit (INDEPENDENT_AMBULATORY_CARE_PROVIDER_SITE_OTHER): Payer: Self-pay | Admitting: Obstetrics & Gynecology

## 2013-09-26 ENCOUNTER — Ambulatory Visit: Payer: Self-pay

## 2013-09-26 VITALS — BP 116/72 | HR 99 | Temp 97.2°F | Wt 165.4 lb

## 2013-09-26 DIAGNOSIS — O09219 Supervision of pregnancy with history of pre-term labor, unspecified trimester: Secondary | ICD-10-CM

## 2013-09-26 DIAGNOSIS — Z3480 Encounter for supervision of other normal pregnancy, unspecified trimester: Secondary | ICD-10-CM

## 2013-09-26 DIAGNOSIS — O24919 Unspecified diabetes mellitus in pregnancy, unspecified trimester: Secondary | ICD-10-CM

## 2013-09-26 DIAGNOSIS — O09529 Supervision of elderly multigravida, unspecified trimester: Secondary | ICD-10-CM

## 2013-09-26 DIAGNOSIS — O209 Hemorrhage in early pregnancy, unspecified: Secondary | ICD-10-CM

## 2013-09-26 DIAGNOSIS — Z758 Other problems related to medical facilities and other health care: Secondary | ICD-10-CM

## 2013-09-26 DIAGNOSIS — O34219 Maternal care for unspecified type scar from previous cesarean delivery: Secondary | ICD-10-CM

## 2013-09-26 DIAGNOSIS — Z609 Problem related to social environment, unspecified: Secondary | ICD-10-CM

## 2013-09-26 DIAGNOSIS — Z3490 Encounter for supervision of normal pregnancy, unspecified, unspecified trimester: Secondary | ICD-10-CM

## 2013-09-26 DIAGNOSIS — Z23 Encounter for immunization: Secondary | ICD-10-CM

## 2013-09-26 DIAGNOSIS — E119 Type 2 diabetes mellitus without complications: Secondary | ICD-10-CM

## 2013-09-26 DIAGNOSIS — O26879 Cervical shortening, unspecified trimester: Secondary | ICD-10-CM

## 2013-09-26 DIAGNOSIS — O24319 Unspecified pre-existing diabetes mellitus in pregnancy, unspecified trimester: Secondary | ICD-10-CM

## 2013-09-26 DIAGNOSIS — Z789 Other specified health status: Secondary | ICD-10-CM

## 2013-09-26 LAB — POCT URINALYSIS DIP (DEVICE)
Bilirubin Urine: NEGATIVE
Glucose, UA: 500 mg/dL — AB
Hgb urine dipstick: NEGATIVE
Ketones, ur: 15 mg/dL — AB
LEUKOCYTES UA: NEGATIVE
Nitrite: NEGATIVE
PROTEIN: NEGATIVE mg/dL
Specific Gravity, Urine: 1.025 (ref 1.005–1.030)
UROBILINOGEN UA: 0.2 mg/dL (ref 0.0–1.0)
pH: 7 (ref 5.0–8.0)

## 2013-09-26 LAB — CBC
HCT: 34.4 % — ABNORMAL LOW (ref 36.0–46.0)
Hemoglobin: 11.9 g/dL — ABNORMAL LOW (ref 12.0–15.0)
MCH: 32.2 pg (ref 26.0–34.0)
MCHC: 34.6 g/dL (ref 30.0–36.0)
MCV: 93.2 fL (ref 78.0–100.0)
PLATELETS: 230 10*3/uL (ref 150–400)
RBC: 3.69 MIL/uL — ABNORMAL LOW (ref 3.87–5.11)
RDW: 14.3 % (ref 11.5–15.5)
WBC: 11.3 10*3/uL — AB (ref 4.0–10.5)

## 2013-09-26 LAB — HEMOGLOBIN A1C
HEMOGLOBIN A1C: 6.2 % — AB (ref <5.7)
Mean Plasma Glucose: 131 mg/dL — ABNORMAL HIGH (ref <117)

## 2013-09-26 MED ORDER — TETANUS-DIPHTH-ACELL PERTUSSIS 5-2.5-18.5 LF-MCG/0.5 IM SUSP: 0.5000 mL | Freq: Once | INTRAMUSCULAR | Status: DC

## 2013-09-26 NOTE — Patient Instructions (Signed)
Regrese a la clinica cuando tenga su cita. Si tiene problemas o preguntas, llama a la clinica o vaya a la sala de emergencia al [facility].    

## 2013-09-26 NOTE — Progress Notes (Signed)
Patient is Spanish-speaking only, Spanish interpreter present for this encounter. Blood sugars are within range, continue NPH 22/18; Regular 12/10.  Modify as needed. Continue weekly 17P. Tdap today and 28 weeks labs. 500 of glucose in urine, will also check HgA1C.  6/10 EFW 61%, cervical length was 0.43 cm with funneling. Continue reduced activities. No other complaints or concerns.  Fetal movement and labor precautions reviewed.

## 2013-09-27 LAB — RPR

## 2013-09-27 LAB — HIV ANTIBODY (ROUTINE TESTING W REFLEX): HIV: NONREACTIVE

## 2013-10-03 ENCOUNTER — Ambulatory Visit (INDEPENDENT_AMBULATORY_CARE_PROVIDER_SITE_OTHER): Payer: Self-pay | Admitting: General Practice

## 2013-10-03 VITALS — BP 122/74 | HR 97 | Ht 61.0 in | Wt 166.8 lb

## 2013-10-03 DIAGNOSIS — O26879 Cervical shortening, unspecified trimester: Secondary | ICD-10-CM

## 2013-10-03 DIAGNOSIS — O09219 Supervision of pregnancy with history of pre-term labor, unspecified trimester: Secondary | ICD-10-CM

## 2013-10-03 NOTE — Addendum Note (Signed)
Addended by: Kathee DeltonHILLMAN, Tianna Baus L on: 10/03/2013 09:38 AM   Modules accepted: Level of Service

## 2013-10-03 NOTE — Progress Notes (Signed)
Reviewed documentation and agree. 

## 2013-10-10 ENCOUNTER — Encounter: Payer: Self-pay | Admitting: Obstetrics and Gynecology

## 2013-10-10 ENCOUNTER — Ambulatory Visit (INDEPENDENT_AMBULATORY_CARE_PROVIDER_SITE_OTHER): Payer: Self-pay | Admitting: Obstetrics and Gynecology

## 2013-10-10 VITALS — BP 124/74 | HR 99 | Wt 169.6 lb

## 2013-10-10 DIAGNOSIS — O09213 Supervision of pregnancy with history of pre-term labor, third trimester: Secondary | ICD-10-CM

## 2013-10-10 DIAGNOSIS — O24313 Unspecified pre-existing diabetes mellitus in pregnancy, third trimester: Secondary | ICD-10-CM

## 2013-10-10 DIAGNOSIS — O34219 Maternal care for unspecified type scar from previous cesarean delivery: Secondary | ICD-10-CM

## 2013-10-10 DIAGNOSIS — O09523 Supervision of elderly multigravida, third trimester: Secondary | ICD-10-CM

## 2013-10-10 DIAGNOSIS — O24919 Unspecified diabetes mellitus in pregnancy, unspecified trimester: Secondary | ICD-10-CM

## 2013-10-10 DIAGNOSIS — O09529 Supervision of elderly multigravida, unspecified trimester: Secondary | ICD-10-CM

## 2013-10-10 DIAGNOSIS — Z789 Other specified health status: Secondary | ICD-10-CM

## 2013-10-10 DIAGNOSIS — O0990 Supervision of high risk pregnancy, unspecified, unspecified trimester: Secondary | ICD-10-CM

## 2013-10-10 DIAGNOSIS — O09219 Supervision of pregnancy with history of pre-term labor, unspecified trimester: Secondary | ICD-10-CM

## 2013-10-10 DIAGNOSIS — E119 Type 2 diabetes mellitus without complications: Secondary | ICD-10-CM

## 2013-10-10 LAB — POCT URINALYSIS DIP (DEVICE)
Bilirubin Urine: NEGATIVE
Glucose, UA: 100 mg/dL — AB
Hgb urine dipstick: NEGATIVE
KETONES UR: 15 mg/dL — AB
LEUKOCYTES UA: NEGATIVE
Nitrite: NEGATIVE
Protein, ur: NEGATIVE mg/dL
Specific Gravity, Urine: 1.02 (ref 1.005–1.030)
Urobilinogen, UA: 0.2 mg/dL (ref 0.0–1.0)
pH: 6 (ref 5.0–8.0)

## 2013-10-10 MED ORDER — INSULIN NPH (HUMAN) (ISOPHANE) 100 UNIT/ML ~~LOC~~ SUSP: 14.0000 [IU] | Freq: Two times a day (BID) | SUBCUTANEOUS | Status: DC

## 2013-10-10 MED ORDER — INSULIN REGULAR HUMAN 100 UNIT/ML IJ SOLN: 8.0000 [IU] | Freq: Two times a day (BID) | INTRAMUSCULAR | Status: DC

## 2013-10-10 NOTE — Progress Notes (Signed)
Patient is doing well without complaints. CBGs within range. Continue current regimen. Start fetal testing at next visit. Continue weekly 17-P

## 2013-10-10 NOTE — Progress Notes (Signed)
Patient needs refill on NPH; patient reports some pelvic pressure

## 2013-10-17 ENCOUNTER — Ambulatory Visit (INDEPENDENT_AMBULATORY_CARE_PROVIDER_SITE_OTHER): Payer: Self-pay | Admitting: *Deleted

## 2013-10-17 VITALS — BP 105/70 | HR 101

## 2013-10-17 DIAGNOSIS — O09213 Supervision of pregnancy with history of pre-term labor, third trimester: Secondary | ICD-10-CM

## 2013-10-17 DIAGNOSIS — O09219 Supervision of pregnancy with history of pre-term labor, unspecified trimester: Secondary | ICD-10-CM

## 2013-10-17 NOTE — Addendum Note (Signed)
Addended by: Jill SideAY, Karmel Patricelli L on: 10/17/2013 08:05 AM   Modules accepted: Orders

## 2013-10-19 ENCOUNTER — Encounter (HOSPITAL_COMMUNITY): Payer: Self-pay

## 2013-10-19 ENCOUNTER — Ambulatory Visit (HOSPITAL_COMMUNITY)
Admission: RE | Admit: 2013-10-19 | Discharge: 2013-10-19 | Disposition: A | Discharge status: Home / Self Care | Payer: Medicaid Other | Source: Ambulatory Visit | Attending: Obstetrics & Gynecology | Admitting: Obstetrics & Gynecology

## 2013-10-19 VITALS — BP 121/67 | HR 93 | Wt 170.5 lb

## 2013-10-19 DIAGNOSIS — O24319 Unspecified pre-existing diabetes mellitus in pregnancy, unspecified trimester: Secondary | ICD-10-CM

## 2013-10-19 DIAGNOSIS — O24919 Unspecified diabetes mellitus in pregnancy, unspecified trimester: Secondary | ICD-10-CM | POA: Insufficient documentation

## 2013-10-19 DIAGNOSIS — Z3689 Encounter for other specified antenatal screening: Secondary | ICD-10-CM | POA: Insufficient documentation

## 2013-10-19 DIAGNOSIS — O09213 Supervision of pregnancy with history of pre-term labor, third trimester: Secondary | ICD-10-CM

## 2013-10-19 DIAGNOSIS — O09523 Supervision of elderly multigravida, third trimester: Secondary | ICD-10-CM

## 2013-10-19 DIAGNOSIS — E119 Type 2 diabetes mellitus without complications: Secondary | ICD-10-CM | POA: Insufficient documentation

## 2013-10-19 DIAGNOSIS — O09219 Supervision of pregnancy with history of pre-term labor, unspecified trimester: Secondary | ICD-10-CM | POA: Insufficient documentation

## 2013-10-24 ENCOUNTER — Ambulatory Visit (INDEPENDENT_AMBULATORY_CARE_PROVIDER_SITE_OTHER): Payer: Self-pay | Admitting: Obstetrics & Gynecology

## 2013-10-24 VITALS — BP 112/64 | HR 97 | Wt 170.6 lb

## 2013-10-24 DIAGNOSIS — O09213 Supervision of pregnancy with history of pre-term labor, third trimester: Secondary | ICD-10-CM

## 2013-10-24 DIAGNOSIS — O09523 Supervision of elderly multigravida, third trimester: Secondary | ICD-10-CM

## 2013-10-24 DIAGNOSIS — O09219 Supervision of pregnancy with history of pre-term labor, unspecified trimester: Secondary | ICD-10-CM

## 2013-10-24 DIAGNOSIS — E119 Type 2 diabetes mellitus without complications: Secondary | ICD-10-CM

## 2013-10-24 DIAGNOSIS — Z789 Other specified health status: Secondary | ICD-10-CM

## 2013-10-24 DIAGNOSIS — O09529 Supervision of elderly multigravida, unspecified trimester: Secondary | ICD-10-CM

## 2013-10-24 DIAGNOSIS — O24919 Unspecified diabetes mellitus in pregnancy, unspecified trimester: Secondary | ICD-10-CM

## 2013-10-24 DIAGNOSIS — O34219 Maternal care for unspecified type scar from previous cesarean delivery: Secondary | ICD-10-CM

## 2013-10-24 DIAGNOSIS — O24313 Unspecified pre-existing diabetes mellitus in pregnancy, third trimester: Secondary | ICD-10-CM

## 2013-10-24 LAB — POCT URINALYSIS DIP (DEVICE)
Bilirubin Urine: NEGATIVE
Glucose, UA: 500 mg/dL — AB
HGB URINE DIPSTICK: NEGATIVE
Ketones, ur: >=160 mg/dL — AB
Leukocytes, UA: NEGATIVE
Nitrite: NEGATIVE
PROTEIN: 30 mg/dL — AB
Specific Gravity, Urine: 1.015 (ref 1.005–1.030)
Urobilinogen, UA: 0.2 mg/dL (ref 0.0–1.0)
pH: 6 (ref 5.0–8.0)

## 2013-10-24 NOTE — Patient Instructions (Signed)
Regrese a la clinica cuando tenga su cita. Si tiene problemas o preguntas, llama a la clinica o vaya a la sala de emergencia al [facility].    

## 2013-10-24 NOTE — Addendum Note (Signed)
Addended by: Jill SideAY, Araseli Sherry L on: 10/24/2013 08:55 AM   Modules accepted: Orders

## 2013-10-24 NOTE — Progress Notes (Signed)
Patient is Spanish-speaking only, Spanish interpreter present for this encounter. 7/8 EFW 2279g (5 lb)/81%, AFI 14.73, cephalic. BS within range but fastings are in high 90s.  Increased nighttime insulin by two units.  New regimen: R 12/10 and NPH 22/20. Continue weekly 17-P.  NST performed today was reviewed and was found to be reactive.  Continue recommended antenatal testing and prenatal care. No other complaints or concerns.  Fetal movement and labor precautions reviewed.

## 2013-10-27 ENCOUNTER — Ambulatory Visit (INDEPENDENT_AMBULATORY_CARE_PROVIDER_SITE_OTHER): Payer: Self-pay | Admitting: *Deleted

## 2013-10-27 VITALS — BP 114/66 | HR 93

## 2013-10-27 DIAGNOSIS — O24313 Unspecified pre-existing diabetes mellitus in pregnancy, third trimester: Secondary | ICD-10-CM

## 2013-10-27 DIAGNOSIS — O24919 Unspecified diabetes mellitus in pregnancy, unspecified trimester: Secondary | ICD-10-CM

## 2013-10-27 DIAGNOSIS — E119 Type 2 diabetes mellitus without complications: Secondary | ICD-10-CM

## 2013-10-27 LAB — FETAL NONSTRESS TEST

## 2013-10-31 ENCOUNTER — Encounter: Payer: Self-pay | Admitting: Obstetrics & Gynecology

## 2013-10-31 ENCOUNTER — Ambulatory Visit (INDEPENDENT_AMBULATORY_CARE_PROVIDER_SITE_OTHER): Payer: Self-pay | Admitting: Obstetrics & Gynecology

## 2013-10-31 VITALS — BP 114/64 | HR 97 | Wt 170.7 lb

## 2013-10-31 DIAGNOSIS — O26879 Cervical shortening, unspecified trimester: Secondary | ICD-10-CM

## 2013-10-31 DIAGNOSIS — O0990 Supervision of high risk pregnancy, unspecified, unspecified trimester: Secondary | ICD-10-CM

## 2013-10-31 DIAGNOSIS — O24919 Unspecified diabetes mellitus in pregnancy, unspecified trimester: Secondary | ICD-10-CM

## 2013-10-31 DIAGNOSIS — O09529 Supervision of elderly multigravida, unspecified trimester: Secondary | ICD-10-CM

## 2013-10-31 DIAGNOSIS — O34219 Maternal care for unspecified type scar from previous cesarean delivery: Secondary | ICD-10-CM

## 2013-10-31 DIAGNOSIS — O09219 Supervision of pregnancy with history of pre-term labor, unspecified trimester: Secondary | ICD-10-CM

## 2013-10-31 LAB — POCT URINALYSIS DIP (DEVICE)
Bilirubin Urine: NEGATIVE
Glucose, UA: 250 mg/dL — AB
HGB URINE DIPSTICK: NEGATIVE
KETONES UR: 40 mg/dL — AB
Leukocytes, UA: NEGATIVE
Nitrite: NEGATIVE
PH: 6.5 (ref 5.0–8.0)
PROTEIN: 100 mg/dL — AB
SPECIFIC GRAVITY, URINE: 1.02 (ref 1.005–1.030)
Urobilinogen, UA: 0.2 mg/dL (ref 0.0–1.0)

## 2013-10-31 NOTE — Progress Notes (Signed)
Patient reports some pelvic pressure and occasional braxton hicks

## 2013-10-31 NOTE — Progress Notes (Signed)
Routine visit. No problems. Denies VB, ROM, or CTXs. U/S done 7/8 showed 81% growth. She has another scheduled for 11-16-13. Her fastings are all about 90 and 2 hours all about 120.

## 2013-11-03 ENCOUNTER — Ambulatory Visit (INDEPENDENT_AMBULATORY_CARE_PROVIDER_SITE_OTHER): Payer: Self-pay | Admitting: General Practice

## 2013-11-03 ENCOUNTER — Ambulatory Visit (HOSPITAL_COMMUNITY)
Admission: RE | Admit: 2013-11-03 | Discharge: 2013-11-03 | Disposition: A | Discharge status: Home / Self Care | Payer: Medicaid Other | Source: Ambulatory Visit | Attending: Obstetrics & Gynecology | Admitting: Obstetrics & Gynecology

## 2013-11-03 VITALS — BP 114/63 | HR 87 | Wt 170.4 lb

## 2013-11-03 DIAGNOSIS — Z3689 Encounter for other specified antenatal screening: Secondary | ICD-10-CM | POA: Insufficient documentation

## 2013-11-03 DIAGNOSIS — O24919 Unspecified diabetes mellitus in pregnancy, unspecified trimester: Secondary | ICD-10-CM | POA: Insufficient documentation

## 2013-11-03 NOTE — Progress Notes (Signed)
NST performed today was reviewed and was found to be reactive.  Continue recommended antenatal testing and prenatal care.  

## 2013-11-07 ENCOUNTER — Ambulatory Visit (INDEPENDENT_AMBULATORY_CARE_PROVIDER_SITE_OTHER): Payer: Self-pay | Admitting: Obstetrics & Gynecology

## 2013-11-07 VITALS — BP 113/66 | HR 108 | Temp 97.7°F | Wt 171.6 lb

## 2013-11-07 DIAGNOSIS — O24313 Unspecified pre-existing diabetes mellitus in pregnancy, third trimester: Secondary | ICD-10-CM

## 2013-11-07 DIAGNOSIS — E119 Type 2 diabetes mellitus without complications: Secondary | ICD-10-CM

## 2013-11-07 DIAGNOSIS — O09523 Supervision of elderly multigravida, third trimester: Secondary | ICD-10-CM

## 2013-11-07 DIAGNOSIS — O34219 Maternal care for unspecified type scar from previous cesarean delivery: Secondary | ICD-10-CM

## 2013-11-07 DIAGNOSIS — O09529 Supervision of elderly multigravida, unspecified trimester: Secondary | ICD-10-CM

## 2013-11-07 DIAGNOSIS — Z789 Other specified health status: Secondary | ICD-10-CM

## 2013-11-07 DIAGNOSIS — O24919 Unspecified diabetes mellitus in pregnancy, unspecified trimester: Secondary | ICD-10-CM

## 2013-11-07 LAB — POCT URINALYSIS DIP (DEVICE)
BILIRUBIN URINE: NEGATIVE
GLUCOSE, UA: 500 mg/dL — AB
HGB URINE DIPSTICK: NEGATIVE
KETONES UR: 15 mg/dL — AB
Leukocytes, UA: NEGATIVE
Nitrite: NEGATIVE
PH: 6 (ref 5.0–8.0)
Protein, ur: 30 mg/dL — AB
Specific Gravity, Urine: 1.015 (ref 1.005–1.030)
Urobilinogen, UA: 0.2 mg/dL (ref 0.0–1.0)

## 2013-11-07 NOTE — Progress Notes (Signed)
Inner left leg hurting when she ambulates/sleeping.  Reassured this was due to baby's position, will continue to follow NST performed today was reviewed and was found to be reactive.  Continue recommended antenatal testing and prenatal care. Blood sugars are within range.  Continue weekly 17P injections until and including 5280w6d (2 more injections after today). No other complaints or concerns.  Fetal movement and labor precautions reviewed.

## 2013-11-07 NOTE — Patient Instructions (Signed)
Regrese a la clinica cuando tenga su cita. Si tiene problemas o preguntas, llama a la clinica o vaya a la sala de emergencia al [facility].    

## 2013-11-09 NOTE — Progress Notes (Signed)
7/16 NST reviewed and reactive 

## 2013-11-10 ENCOUNTER — Ambulatory Visit (INDEPENDENT_AMBULATORY_CARE_PROVIDER_SITE_OTHER): Payer: Self-pay | Admitting: *Deleted

## 2013-11-10 VITALS — BP 113/63 | HR 89

## 2013-11-10 DIAGNOSIS — O24919 Unspecified diabetes mellitus in pregnancy, unspecified trimester: Secondary | ICD-10-CM

## 2013-11-10 DIAGNOSIS — E119 Type 2 diabetes mellitus without complications: Secondary | ICD-10-CM

## 2013-11-10 DIAGNOSIS — O24313 Unspecified pre-existing diabetes mellitus in pregnancy, third trimester: Secondary | ICD-10-CM

## 2013-11-10 LAB — FETAL NONSTRESS TEST

## 2013-11-10 NOTE — Progress Notes (Signed)
Pt has US for growth on 8/5 @ [facility] - will add BPP as second fetal testing for the week

## 2013-11-13 NOTE — Progress Notes (Signed)
7/30 NST reviewed and reactive 

## 2013-11-14 ENCOUNTER — Ambulatory Visit (INDEPENDENT_AMBULATORY_CARE_PROVIDER_SITE_OTHER): Payer: Self-pay | Admitting: Family Medicine

## 2013-11-14 VITALS — BP 118/66 | HR 103 | Temp 98.4°F | Wt 174.2 lb

## 2013-11-14 DIAGNOSIS — O09219 Supervision of pregnancy with history of pre-term labor, unspecified trimester: Secondary | ICD-10-CM

## 2013-11-14 DIAGNOSIS — E119 Type 2 diabetes mellitus without complications: Secondary | ICD-10-CM

## 2013-11-14 DIAGNOSIS — O24919 Unspecified diabetes mellitus in pregnancy, unspecified trimester: Secondary | ICD-10-CM

## 2013-11-14 DIAGNOSIS — O24313 Unspecified pre-existing diabetes mellitus in pregnancy, third trimester: Secondary | ICD-10-CM

## 2013-11-14 DIAGNOSIS — O09213 Supervision of pregnancy with history of pre-term labor, third trimester: Secondary | ICD-10-CM

## 2013-11-14 LAB — POCT URINALYSIS DIP (DEVICE)
Bilirubin Urine: NEGATIVE
GLUCOSE, UA: 500 mg/dL — AB
HGB URINE DIPSTICK: NEGATIVE
KETONES UR: 15 mg/dL — AB
Leukocytes, UA: NEGATIVE
Nitrite: NEGATIVE
PH: 5.5 (ref 5.0–8.0)
Protein, ur: 30 mg/dL — AB
Specific Gravity, Urine: 1.02 (ref 1.005–1.030)
Urobilinogen, UA: 0.2 mg/dL (ref 0.0–1.0)

## 2013-11-14 LAB — OB RESULTS CONSOLE GC/CHLAMYDIA
Chlamydia: NEGATIVE
Gonorrhea: NEGATIVE

## 2013-11-14 LAB — OB RESULTS CONSOLE GBS: GBS: NEGATIVE

## 2013-11-14 MED ORDER — INSULIN REGULAR HUMAN 100 UNIT/ML IJ SOLN: 12.0000 [IU] | Freq: Two times a day (BID) | INTRAMUSCULAR | Status: DC

## 2013-11-14 NOTE — Patient Instructions (Signed)
Diabetes mellitus gestacional (Gestational Diabetes Mellitus) La diabetes mellitus gestacional, ms comnmente conocida como diabetes gestacional es un tipo de diabetes que desarrollan algunas mujeres durante el embarazo. En la diabetes gestacional, el pncreas no produce suficiente insulina (una hormona) o las clulas son menos sensibles a la insulina producida (resistencia a la insulina), o ambas cosas. Normalmente, la insulina mueve los azcares de los alimentos a las clulas de los tejidos. Las clulas de los tejidos utilizan los azcares para obtener energa. La falta de insulina o la falta de una respuesta normal a la insulina hace que el exceso de azcar se acumule en la sangre en lugar de penetrar en las clulas de los tejidos. Como resultado, se producen niveles altos de azcar en la sangre (hiperglucemia). El efecto de los niveles altos de azcar (glucosa) puede causar muchos problemas.  FACTORES DE RIESGO Usted tiene mayor probabilidad de desarrollar diabetes gestacional si tiene antecedentes familiares de diabetes y tambin si tiene uno o ms de los siguientes factores de riesgo:  ndice de masa corporal superior a 30 (obesidad).  Embarazo previo con diabetes gestacional.  La edad avanzada en el momento del embarazo. Si se mantienen los niveles de glucosa en la sangre en un rango normal durante el embarazo, las mujeres pueden tener un embarazo saludable. Si los niveles de glucosa en la sangre no estn bien controlados, puede haber riesgos para usted, el feto o el recin nacido, o durante el trabajo de parto y el parto.  SNTOMAS  Si se presentan sntomas, stos son similares a los sntomas que normalmente experimentar durante el embarazo. Los sntomas de la diabetes gestacional son:   Aumento de la sed (polidipsia).  Aumento de la miccin (poliuria).  Orina con ms frecuencia durante la noche (nocturia).  Prdida de peso. La prdida de peso puede ser muy rpida.  Infecciones  frecuentes y recurrentes.  Cansancio (fatiga).  Debilidad.  Cambios en la visin, como visin borrosa.  Olor a fruta en el aliento.  Dolor abdominal. DIAGNSTICO La diabetes se diagnostica cuando hay aumento de los niveles de glucosa en la sangre. El nivel de glucosa en la sangre puede controlarse en uno o ms de los siguientes anlisis de sangre:  Medicin de glucosa en la sangre en ayunas. No se le permitir comer durante al menos 8 horas antes de que se tome una muestra de sangre.  Pruebas al azar de glucosa en la sangre. El nivel de glucosa en la sangre se controla en cualquier momento del da sin importar el momento en que haya comido.  Prueba de A1c (hemoglobina glucosilada) Una prueba de A1c proporciona informacin sobre el control de la glucosa en la sangre durante los ltimos 3 meses.  Prueba de tolerancia a la glucosa oral (PTGO). La glucosa en la sangre se mide despus de no haber comido (ayunas) durante una a tres horas y despus de beber una bebida que contenga glucosa. Dado que las hormonas que causan la resistencia a la insulina son ms altas alrededor de las semanas 24 a 28 de embarazo, generalmente se realiza una PTGO durante ese tiempo. Si tiene factores de riesgo de diabetes gestacional, su mdico puede hacerle estudios de deteccin antes de las 24semanas de embarazo. TRATAMIENTO   Usted tendr que tomar medicamentos para la diabetes o insulina diariamente para mantener los niveles de glucosa en la sangre en el rango deseado.  Usted tendr que combinar la dosis de insulina con la actividad fsica y la eleccin de alimentos saludables. El objetivo del   tratamiento es mantener el nivel de azcar en la sangre previo a comer (preprandial) y durante la noche entre 60 y 99mg/dl, durante todo el embarazo. El objetivo del tratamiento es mantener el nivel pico de azcar en la sangre despus de comer (glucosa posprandial) entre 100y 140mg/dl. INSTRUCCIONES PARA EL CUIDADO EN EL  HOGAR   Controle su nivel de hemoglobina A1c dos veces al ao.  Contrlese a diario el nivel de glucosa en la sangre segn las indicaciones de su mdico. Es comn realizar controles frecuentes de la glucosa en la sangre.  Supervise las cetonas en la orina cuando est enferma y segn las indicaciones de su mdico.  Tome el medicamento para la diabetes y adminstrese insulina segn las indicaciones de su mdico para mantener el nivel de glucosa en la sangre en el rango deseado.  Nunca se quede sin medicamento para la diabetes o sin insulina. Es necesario que la reciba todos los das.  Ajuste la insulina segn la ingesta de hidratos de carbono. Los hidratos de carbono pueden aumentar los niveles de glucosa en la sangre, pero deben incluirse en su dieta. Los hidratos de carbono aportan vitaminas, minerales y fibra que son una parte esencial de una dieta saludable. Los hidratos de carbono se encuentran en frutas, verduras, cereales integrales, productos lcteos, legumbres y alimentos que contienen azcares aadidos.  Consuma alimentos saludables. Alterne 3 comidas con 3 colaciones.  Aumente de peso saludablemente. El aumento del peso total vara de acuerdo con el ndice de masa corporal que tena antes del embarazo ([facility]).  Lleve una tarjeta de alerta mdica o use una pulsera o medalla de alerta mdica.  Lleve con usted una colacin de 15gramos de hidratos de carbono en todo momento para controlar los niveles bajos de glucosa en la sangre (hipoglucemia). Algunos ejemplos de colaciones de 15gramos de hidratos de carbono son los siguientes:  Tabletas de glucosa, 3 o 4.  Gel de glucosa, tubo de 15 gramos.  Pasas de uva, 2 cucharadas (24 g).  Caramelos de goma, 6.  Galletas de animales, 8.  Jugo de fruta, gaseosa comn, o leche descremada, 4 onzas (120 ml).  Pastillas de goma, 9.  Reconocer la hipoglucemia. Durante el embarazo la hipoglucemia se produce cuando hay niveles de glucosa en la  sangre de 60 mg/dl o menos. El riesgo de hipoglucemia aumenta durante el ayuno o cuando se saltea las comidas, durante o despus de realizar ejercicio intenso y mientras duerme. Los sntomas de hipoglucemia son:  Temblores o sacudidas.  Disminucin de la capacidad de concentracin.  Sudoracin.  Aumento de la frecuencia cardaca.  Dolor de cabeza.  Sequedad en la boca.  Hambre.  Irritabilidad.  Ansiedad.  Sueo agitado.  Alteracin del habla o de la coordinacin.  Confusin.  Tratar la hipoglucemia rpidamente. Si usted est alerta y puede tragar con seguridad, siga la regla de 15/15 que consiste en:  Tome entre 15 y 20gramos de glucosa de accin rpida o carbohidratos. Las opciones de accin rpida son un gel de glucosa, tabletas de glucosa, o 4 onzas (120 ml) de jugo de frutas, gaseosa comn, o leche baja en grasa.  Compruebe su nivel de glucosa en la sangre 15 minutos despus de tomar la glucosa.  Tome entre 15 y 20 gramos ms de glucosa si el nivel de glucosa en la sangre todava es de 70mg/dl o inferior.  Ingiera una comida o una colacin en el lapso de 1 hora una vez que los niveles de glucosa en la sangre vuelven   a la normalidad.  Est atento a la poliuria (miccin excesiva) y la polidipsia (sensacin de mucha sed), que son los primeros signos de la hiperglucemia. El reconocimiento temprano de la hiperglucemia permite un tratamiento oportuno. Trate la hiperglucemia segn le indic su mdico.  Haga actividad fsica por lo menos 30minutos al da o como lo indique su mdico. Se recomienda que 30 minutos despus de cada comida, realice diez minutos de actividad fsica para controlar los niveles de glucosa postprandial en la sangre.  Ajuste su dosis de insulina y la ingesta de alimentos, segn sea necesario, si inicia un nuevo ejercicio o deporte.  Siga su plan para los das de enfermedad cuando no pueda comer o beber como de costumbre.  Evite el tabaco y el  alcohol.  Concurra a todas las visitas de control como se lo haya indicado el mdico.  Siga el consejo del mdico respecto a los controles prenatales y posteriores al parto (postparto), las visitas, la planificacin de las comidas, el ejercicio, los medicamentos, las vitaminas, los anlisis de sangre, otras pruebas mdicas y actividades fsicas.  Realice diariamente el cuidado de la piel y de los pies. Examine su piel y los pies diariamente para ver si tiene cortes, moretones, enrojecimiento, problemas en las uas, sangrado, ampollas o llagas.  Cepllese los dientes y encas por lo menos dos veces al da y use hilo dental al menos una vez por da. Concurra regularmente a las visitas de control con el dentista.  Programe un examen de vista durante el primer trimestre de su embarazo o como lo indique su mdico.  Comparta su plan de control de diabetes en el trabajo o en la escuela.  Mantngase al da con las vacunas.  Aprenda a manejar el estrs.  Obtenga la mayor cantidad posible de informacin sobre la diabetes y solicite ayuda siempre que sea necesario.  Obtenga informacin sobre el amamantamiento y analice esta posibilidad.  Debe controlar el nivel de azcar en la sangre de 6a 12semanas despus del parto. Esto se hace con una prueba de tolerancia a la glucosa oral (PTGO). SOLICITE ATENCIN MDICA SI:   No puede comer alimentos o beber por ms de 6 horas.  Tuvo nuseas o ha vomitado durante ms de 6 horas.  Tiene un nivel de glucosa en la sangre de 200 mg/dl y cetonas en la orina.  Presenta algn cambio en el estado mental.  Desarrolla problemas de visin.  Sufre un dolor persistente de cabeza.  Siente dolor o molestias en la parte superior del abdomen.  Desarrolla una enfermedad grave adicional.  Tuvo diarrea durante ms de 6 horas.  Ha estado enfermo o ha tenido fiebre durante un par de das y no mejora. SOLICITE ATENCIN MDICA DE INMEDIATO SI:   Tiene dificultad  para respirar.  Ya no siente los movimientos del beb.  Est sangrando o tiene flujo vaginal.  Comienza a tener contracciones o trabajo de parto prematuro. ASEGRESE DE QUE:  Comprende estas instrucciones.  Controlar su afeccin.  Recibir ayuda de inmediato si no mejora o si empeora. Document Released: 01/08/2005 Document Revised: 08/15/2013 [employer] Patient Information 2015 [employer]. This information is not intended to replace advice given to you by your health care provider. Make sure you discuss any questions you have with your health care provider.  Lactancia materna (Breastfeeding) Decidir amamantar es una de las mejores elecciones que puede hacer por usted y su beb. El cambio hormonal durante el embarazo produce el desarrollo del tejido mamario y aumenta la cantidad   y el tamao de los conductos galactforos. Estas hormonas tambin permiten que las protenas, los azcares y las grasas de la sangre produzcan la leche materna en las glndulas productoras de leche. Las hormonas impiden que la leche materna sea liberada antes del nacimiento del beb, adems de impulsar el flujo de leche luego del nacimiento. Una vez que ha comenzado a amamantar, pensar en el beb, as como la succin o el llanto, pueden estimular la liberacin de leche de las glndulas productoras de leche.  LOS BENEFICIOS DE AMAMANTAR Para el beb  La primera leche (calostro) ayuda a mejorar el funcionamiento del sistema digestivo del beb.  La leche tiene anticuerpos que ayudan a prevenir las infecciones en el beb.  El beb tiene una menor incidencia de asma, alergias y del sndrome de muerte sbita del lactante.  Los nutrientes en la leche materna son mejores para el beb que la leche maternizada y estn preparados exclusivamente para cubrir las necesidades del beb.  La leche materna mejora el desarrollo cerebral del beb.  Es menos probable que el beb desarrolle otras enfermedades, como obesidad  infantil, asma o diabetes mellitus de tipo 2. Para usted   La lactancia materna favorece el desarrollo de un vnculo muy especial entre la madre y el beb.  Es conveniente. La leche materna siempre est disponible a la temperatura correcta y es econmica.  La lactancia materna ayuda a quemar caloras y a perder el peso ganado durante el embarazo.  Favorece la contraccin del tero al tamao que tena antes del embarazo de manera ms rpida y disminuye el sangrado (loquios) despus del parto.  La lactancia materna contribuye a reducir el riesgo de desarrollar diabetes mellitus de tipo 2, osteoporosis o cncer de mama o de ovario en el futuro. SIGNOS DE QUE EL BEB EST HAMBRIENTO Primeros signos de hambre  Aumenta su estado de alerta o actividad.  Se estira.  Mueve la cabeza de un lado a otro.  Mueve la cabeza y abre la boca cuando se le toca la mejilla o la comisura de la boca (reflejo de bsqueda).  Aumenta las vocalizaciones, tales como sonidos de succin, se relame los labios, emite arrullos, suspiros, o chirridos.  Mueve la mano hacia la boca.  Se chupa con ganas los dedos o las manos. Signos tardos de hambre  Est agitado.  Llora de manera intermitente. Signos de hambre extrema Los signos de hambre extrema requerirn que lo calme y lo consuele antes de que el beb pueda alimentarse adecuadamente. No espere a que se manifiesten los siguientes signos de hambre extrema para comenzar a amamantar:   Agitacin.  Llanto intenso y fuerte.   Gritos. INFORMACIN BSICA SOBRE LA LACTANCIA MATERNA Iniciacin de la lactancia materna  Encuentre un lugar cmodo para sentarse o acostarse, con un buen respaldo para el cuello y la espalda.  Coloque una almohada o una manta enrollada debajo del beb para acomodarlo a la altura de la mama (si est sentada). Las almohadas para amamantar se han diseado especialmente a fin de servir de apoyo para los brazos y el beb mientras  amamanta.  Asegrese de que el abdomen del beb est frente al suyo.  Masajee suavemente la mama. Con las yemas de los dedos, masajee la pared del pecho hacia el pezn en un movimiento circular. Esto estimula el flujo de leche. Es posible que deba continuar este movimiento mientras amamanta si la leche fluye lentamente.  Sostenga la mama con el pulgar por arriba del pezn y los otros   4 dedos por debajo de la mama. Asegrese de que los dedos se encuentren lejos del pezn y de la boca del beb.  Empuje suavemente los labios del beb con el pezn o con el dedo.  Cuando la boca del beb se abra lo suficiente, acrquelo rpidamente a la mama e introduzca todo el pezn y la zona oscura que lo rodea (areola), tanto como sea posible, dentro de la boca del beb.  Debe haber ms areola visible por arriba del labio superior del beb que por debajo del labio inferior.  La lengua del beb debe estar entre la enca inferior y la mama.  Asegrese de que la boca del beb est en la posicin correcta alrededor del pezn (prendida). Los labios del beb deben crear un sello sobre la mama y estar doblados hacia afuera (invertidos).  Es comn que el beb succione durante 2 a 3 minutos para que comience el flujo de leche materna. Cmo debe prenderse Es muy importante que le ensee al beb cmo prenderse adecuadamente a la mama. Si el beb no se prende adecuadamente, puede causarle dolor en el pezn y reducir la produccin de leche materna, y hacer que el beb tenga un escaso aumento de peso. Adems, si el beb no se prende adecuadamente al pezn, puede tragar aire durante la alimentacin. Esto puede causarle molestias al beb. Hacer eructar al beb al cambiar de mama puede ayudarlo a liberar el aire. Sin embargo, ensearle al beb cmo prenderse a la mama adecuadamente es la mejor manera de evitar que se sienta molesto por tragar aire mientras se alimenta. Signos de que el beb se ha prendido adecuadamente al pezn:    Tironea o succiona de modo silencioso, sin causarle dolor.  Se escucha que traga cada 3 o 4 succiones.   Hay movimientos musculares por arriba y por delante de sus odos al succionar. Signos de que el beb no se ha prendido adecuadamente al pezn:   Hace ruidos de succin o de chasquido mientras se alimenta.  Siente dolor en el pezn. Si cree que el beb no se prendi correctamente, deslice el dedo en la comisura de la boca y colquelo entre las encas del beb para interrumpir la succin. Intente comenzar a amamantar nuevamente. Signos de lactancia materna exitosa Signos del beb:   Disminuye gradualmente el nmero de succiones o cesa la succin por completo.  Se duerme.  Relaja el cuerpo.  Retiene una pequea cantidad de leche en la boca.  Se desprende solo del pecho. Signos que presenta usted:  Las mamas han aumentado la firmeza, el peso y el tamao 1 a 3 horas despus de amamantar.  Estn ms blandas inmediatamente despus de amamantar.  Un aumento del volumen de leche, y tambin un cambio en su consistencia y color se producen hacia el quinto da de lactancia materna.  Los pezones no duelen, ni estn agrietados ni sangran. Signos de que su beb recibe la cantidad de leche suficiente  Moja al menos 3 paales en 24 horas. La orina debe ser clara y de color amarillo plido a los 5 das de vida.  Defeca al menos 3 veces en 24 horas a los 5 das de vida. La materia fecal debe ser blanda y amarillenta.  Defeca al menos 3 veces en 24 horas a los 7 das de vida. La materia fecal debe ser grumosa y amarillenta.  No registra una prdida de peso mayor del 10% del peso al nacer durante los primeros 3 das de vida.    Aumenta de peso un promedio de 4 a 7onzas (113 a 198g) por semana despus de los 4 das de vida.  Aumenta de peso, diariamente, de manera uniforme a partir de los 5 das de vida, sin registrar prdida de peso despus de las 2semanas de vida. Despus de  alimentarse, es posible que el beb regurgite una pequea cantidad. Esto es frecuente. FRECUENCIA Y DURACIN DE LA LACTANCIA MATERNA El amamantamiento frecuente la ayudar a producir ms leche y a prevenir problemas de dolor en los pezones e hinchazn en las mamas. Alimente al beb cuando muestre signos de hambre o si siente la necesidad de reducir la congestin de las mamas. Esto se denomina "lactancia a demanda". Evite el uso del chupete mientras trabaja para establecer la lactancia (las primeras 4 a 6 semanas despus del nacimiento del beb). Despus de este perodo, podr ofrecerle un chupete. Las investigaciones demostraron que el uso del chupete durante el primer ao de vida del beb disminuye el riesgo de desarrollar el sndrome de muerte sbita del lactante (SMSL). Permita que el nio se alimente en cada mama todo lo que desee. Contine amamantando al beb hasta que haya terminado de alimentarse. Cuando el beb se desprende o se queda dormido mientras se est alimentando de la primera mama, ofrzcale la segunda. Debido a que, con frecuencia, los recin nacidos permanecen somnolientos las primeras semanas de vida, es posible que deba despertar al beb para alimentarlo. Los horarios de lactancia varan de un beb a otro. Sin embargo, las siguientes reglas pueden servir como gua para ayudarla a garantizar que el beb se alimenta adecuadamente:  Se puede amamantar a los recin nacidos (bebs de 4 semanas o menos de vida) cada 1 a 3 horas.  No deben transcurrir ms de 3 horas durante el da o 5 horas durante la noche sin que se amamante a los recin nacidos.  Debe amamantar al beb 8 veces como mnimo en un perodo de 24 horas, hasta que comience a introducir slidos en su dieta, a los 6 meses de vida aproximadamente. EXTRACCIN DE LECHE MATERNA La extraccin y el almacenamiento de la leche materna le permiten asegurarse de que el beb se alimente exclusivamente de leche materna, aun en momentos en  los que no puede amamantar. Esto tiene especial importancia si debe regresar al trabajo en el perodo en que an est amamantando o si no puede estar presente en los momentos en que el beb debe alimentarse. Su asesor en lactancia puede orientarla sobre cunto tiempo es seguro almacenar leche materna.  El sacaleche es un aparato que le permite extraer leche de la mama a un recipiente estril. Luego, la leche materna extrada puede almacenarse en un refrigerador o congelador. Algunos sacaleches son manuales, mientras que otros son elctricos. Consulte a su asesor en lactancia qu tipo ser ms conveniente para usted. Los sacaleches se pueden comprar; sin embargo, algunos hospitales y grupos de apoyo a la lactancia materna alquilan sacaleches mensualmente. Un asesor en lactancia puede ensearle cmo extraer leche materna manualmente, en caso de que prefiera no usar un sacaleche.  CMO CUIDAR LAS MAMAS DURANTE LA LACTANCIA MATERNA Los pezones se secan, agrietan y duelen durante la lactancia materna. Las siguientes recomendaciones pueden ayudarla a mantener las mamas humectadas y sanas:  Evite usar jabn en los pezones.  Use un sostn de soporte. Aunque no son esenciales, las camisetas sin mangas o los sostenes especiales para amamantar estn diseados para acceder fcilmente a las mamas, para amamantar sin tener que quitarse todo   el sostn o la camiseta. Evite usar sostenes con aro o sostenes muy ajustados.  Seque al aire sus pezones durante 3 a 4minutos despus de amamantar al beb.  Utilice solo apsitos de algodn en el sostn para absorber las prdidas de leche. La prdida de un poco de leche materna entre las tomas es normal.  Utilice lanolina sobre los pezones luego de amamantar. La lanolina ayuda a mantener la humedad normal de la piel. Si usa lanolina pura, no tiene que lavarse los pezones antes de volver a alimentar al beb. La lanolina pura no es txica para el beb. Adems, puede extraer  manualmente algunas gotas de leche materna y masajear suavemente esa leche sobre los pezones, para que la leche se seque al aire. Durante las primeras semanas despus de dar a luz, algunas mujeres pueden experimentar hinchazn en las mamas (congestin mamaria). La congestin puede hacer que sienta las mamas pesadas, calientes y sensibles al tacto. El pico de la congestin ocurre dentro de los 3 a 5 das despus del parto. Las siguientes recomendaciones pueden ayudarla a aliviar la congestin:  Vace por completo las mamas al amamantar o extraer leche. Puede aplicar calor hmedo en las mamas (en la ducha o con toallas hmedas para manos) antes de amamantar o extraer leche. Esto aumenta la circulacin y ayuda a que la leche fluya. Si el beb no vaca por completo las mamas cuando lo amamanta, extraiga la leche restante despus de que haya finalizado.  Use un sostn ajustado (para amamantar o comn) o una camiseta sin mangas durante 1 o 2 das para indicar al cuerpo que disminuya ligeramente la produccin de leche.  Aplique compresas de hielo sobre las mamas, a menos que le resulte demasiado incmodo.  Asegrese de que el beb est prendido y se encuentre en la posicin correcta mientras lo alimenta. Si la congestin persiste luego de 48 horas o despus de seguir estas recomendaciones, comunquese con su mdico o un asesor en lactancia. RECOMENDACIONES GENERALES PARA EL CUIDADO DE LA SALUD DURANTE LA LACTANCIA MATERNA  Consuma alimentos saludables. Alterne comidas y colaciones, y coma 3 de cada una por da. Dado que lo que come afecta la leche materna, es posible que algunas comidas hagan que su beb se vuelva ms irritable de lo habitual. Evite comer este tipo de alimentos si percibe que afectan de manera negativa al beb.  Beba leche, jugos de fruta y agua para satisfacer su sed (aproximadamente 10 vasos al da).  Descanse con frecuencia, reljese y tome sus vitaminas prenatales para evitar la  fatiga, el estrs y la anemia.  Contine con los autocontroles de la mama.  Evite masticar y fumar tabaco.  Evite el consumo de alcohol y drogas. Algunos medicamentos, que pueden ser perjudiciales para el beb, pueden pasar a travs de la leche materna. Es importante que consulte a su mdico antes de tomar cualquier medicamento, incluidos todos los medicamentos recetados y de venta libre, as como los suplementos vitamnicos y herbales. Puede quedar embarazada durante la lactancia. Si desea controlar la natalidad, consulte a su mdico cules son las opciones ms seguras para el beb. SOLICITE ATENCIN MDICA SI:   Usted siente que quiere dejar de amamantar o se siente frustrada con la lactancia.  Siente dolor en las mamas o en los pezones.  Sus pezones estn agrietados o sangran.  Sus pechos estn irritados, sensibles o calientes.  Tiene un rea hinchada en cualquiera de las mamas.  Siente escalofros o fiebre.  Tiene nuseas o vmitos.    Presenta una secrecin de otro lquido distinto de la leche materna de los pezones.  Sus mamas no se llenan antes de amamantar al beb para el quinto da despus del parto.  Se siente triste y deprimida.  El beb est demasiado somnoliento como para comer bien.  El beb tiene problemas para dormir.  Moja menos de 3 paales en 24 horas.  Defeca menos de 3 veces en 24 horas.  La piel del beb o la parte blanca de los ojos se vuelven amarillentas.  El beb no ha aumentado de peso a los 5 das de vida. SOLICITE ATENCIN MDICA DE INMEDIATO SI:   El beb est muy cansado (letargo) y no se quiere despertar para comer.  Le sube la fiebre sin causa. Document Released: 03/31/2005 Document Revised: 04/05/2013 ExitCare Patient Information 2015 [employer]. This information is not intended to replace advice given to you by your health care provider. Make sure you discuss any questions you have with your health care provider.  

## 2013-11-14 NOTE — Progress Notes (Signed)
NST/OB follow up.  US for growth & BPP @ [facility] on 8/5.  Pt does not have a vial with 17P medication.  No 17P injection given today.

## 2013-11-14 NOTE — Progress Notes (Signed)
17 P today NST reviewed and reactive. FBS 89-94 2 hr pp 80-127--most dinners are out of range.--will increase her dinner regular to 12 u. Cultures today

## 2013-11-15 LAB — GC/CHLAMYDIA PROBE AMP
CT Probe RNA: NEGATIVE
GC PROBE AMP APTIMA: NEGATIVE

## 2013-11-16 ENCOUNTER — Ambulatory Visit (HOSPITAL_COMMUNITY)
Admission: RE | Admit: 2013-11-16 | Discharge: 2013-11-16 | Disposition: A | Discharge status: Home / Self Care | Payer: Medicaid Other | Source: Ambulatory Visit | Attending: Obstetrics & Gynecology | Admitting: Obstetrics & Gynecology

## 2013-11-16 ENCOUNTER — Encounter (HOSPITAL_COMMUNITY): Payer: Self-pay

## 2013-11-16 DIAGNOSIS — Z3689 Encounter for other specified antenatal screening: Secondary | ICD-10-CM | POA: Insufficient documentation

## 2013-11-16 DIAGNOSIS — O09523 Supervision of elderly multigravida, third trimester: Secondary | ICD-10-CM

## 2013-11-16 DIAGNOSIS — O24319 Unspecified pre-existing diabetes mellitus in pregnancy, unspecified trimester: Secondary | ICD-10-CM

## 2013-11-16 DIAGNOSIS — O24313 Unspecified pre-existing diabetes mellitus in pregnancy, third trimester: Secondary | ICD-10-CM

## 2013-11-16 DIAGNOSIS — O24919 Unspecified diabetes mellitus in pregnancy, unspecified trimester: Secondary | ICD-10-CM | POA: Insufficient documentation

## 2013-11-16 DIAGNOSIS — O09529 Supervision of elderly multigravida, unspecified trimester: Secondary | ICD-10-CM | POA: Insufficient documentation

## 2013-11-16 DIAGNOSIS — E119 Type 2 diabetes mellitus without complications: Secondary | ICD-10-CM | POA: Insufficient documentation

## 2013-11-16 LAB — CULTURE, BETA STREP (GROUP B ONLY)

## 2013-11-17 ENCOUNTER — Encounter: Payer: Self-pay | Admitting: Obstetrics and Gynecology

## 2013-11-17 ENCOUNTER — Other Ambulatory Visit: Payer: Self-pay

## 2013-11-17 ENCOUNTER — Encounter: Payer: Self-pay | Admitting: Family Medicine

## 2013-11-17 DIAGNOSIS — O3660X0 Maternal care for excessive fetal growth, unspecified trimester, not applicable or unspecified: Secondary | ICD-10-CM

## 2013-11-17 HISTORY — DX: Maternal care for excessive fetal growth, unspecified trimester, not applicable or unspecified: O36.60X0

## 2013-11-21 ENCOUNTER — Ambulatory Visit (INDEPENDENT_AMBULATORY_CARE_PROVIDER_SITE_OTHER): Payer: Self-pay | Admitting: Obstetrics & Gynecology

## 2013-11-21 VITALS — BP 106/67 | HR 92 | Wt 176.8 lb

## 2013-11-21 DIAGNOSIS — O3090 Multiple gestation, unspecified, unspecified trimester: Secondary | ICD-10-CM

## 2013-11-21 DIAGNOSIS — O09213 Supervision of pregnancy with history of pre-term labor, third trimester: Secondary | ICD-10-CM

## 2013-11-21 DIAGNOSIS — O09219 Supervision of pregnancy with history of pre-term labor, unspecified trimester: Secondary | ICD-10-CM

## 2013-11-21 DIAGNOSIS — O09523 Supervision of elderly multigravida, third trimester: Secondary | ICD-10-CM

## 2013-11-21 DIAGNOSIS — O3663X1 Maternal care for excessive fetal growth, third trimester, fetus 1: Secondary | ICD-10-CM

## 2013-11-21 DIAGNOSIS — O3660X0 Maternal care for excessive fetal growth, unspecified trimester, not applicable or unspecified: Secondary | ICD-10-CM

## 2013-11-21 DIAGNOSIS — O24313 Unspecified pre-existing diabetes mellitus in pregnancy, third trimester: Secondary | ICD-10-CM

## 2013-11-21 DIAGNOSIS — O34219 Maternal care for unspecified type scar from previous cesarean delivery: Secondary | ICD-10-CM

## 2013-11-21 DIAGNOSIS — O24919 Unspecified diabetes mellitus in pregnancy, unspecified trimester: Secondary | ICD-10-CM

## 2013-11-21 DIAGNOSIS — E119 Type 2 diabetes mellitus without complications: Secondary | ICD-10-CM

## 2013-11-21 DIAGNOSIS — O09529 Supervision of elderly multigravida, unspecified trimester: Secondary | ICD-10-CM

## 2013-11-21 LAB — POCT URINALYSIS DIP (DEVICE)
BILIRUBIN URINE: NEGATIVE
Glucose, UA: 100 mg/dL — AB
HGB URINE DIPSTICK: NEGATIVE
Ketones, ur: NEGATIVE mg/dL
Nitrite: NEGATIVE
Protein, ur: NEGATIVE mg/dL
Specific Gravity, Urine: 1.015 (ref 1.005–1.030)
Urobilinogen, UA: 0.2 mg/dL (ref 0.0–1.0)
pH: 6.5 (ref 5.0–8.0)

## 2013-11-21 NOTE — Patient Instructions (Signed)
Return to clinic for any obstetric concerns or go to [facility]U for evaluation  

## 2013-11-21 NOTE — Progress Notes (Signed)
Patient reports dizziness, pelvic pressure and difficulty sleeping.  Patient reassured, rare contractions during NST. Blood sugars are within range. 8/5 EFW 3747g (8 lb 4oz)/>90%, AC >97%, AFI 14.45, cephalic.  GBS neg.   Patient concerned about baby's size, was not able to have a vaginal delivery with last baby that was 8 lb 9 oz.  Discussed that there was no indication currently for outright cesarean delivery, she can choose to have elective RLTCS.  However, will obtain another ultrasound around 12/01/13 (closer to 39w) to reevaluate size.  Patient and her husband will think about delivery mode, and let us know if she changes her mind. NST performed today was reviewed and was found to be reactive.  Continue recommended antenatal testing and prenatal care. No other complaints or concerns.  Labor and fetal movement precautions reviewed.

## 2013-11-24 ENCOUNTER — Ambulatory Visit (INDEPENDENT_AMBULATORY_CARE_PROVIDER_SITE_OTHER): Payer: Self-pay | Admitting: *Deleted

## 2013-11-24 VITALS — BP 113/65 | HR 83

## 2013-11-24 DIAGNOSIS — O24919 Unspecified diabetes mellitus in pregnancy, unspecified trimester: Secondary | ICD-10-CM

## 2013-11-24 LAB — FETAL NONSTRESS TEST

## 2013-11-24 NOTE — Progress Notes (Addendum)
NST

## 2013-11-25 NOTE — Progress Notes (Signed)
NST performed today was reviewed and was found to be reactive.  AFI normal at 15.9 cm. Continue recommended antenatal testing and prenatal care.

## 2013-11-28 ENCOUNTER — Ambulatory Visit (INDEPENDENT_AMBULATORY_CARE_PROVIDER_SITE_OTHER): Payer: Self-pay | Admitting: Obstetrics and Gynecology

## 2013-11-28 ENCOUNTER — Encounter: Payer: Self-pay | Admitting: Obstetrics and Gynecology

## 2013-11-28 VITALS — BP 118/67 | HR 89 | Wt 178.1 lb

## 2013-11-28 DIAGNOSIS — O24919 Unspecified diabetes mellitus in pregnancy, unspecified trimester: Secondary | ICD-10-CM

## 2013-11-28 DIAGNOSIS — O0990 Supervision of high risk pregnancy, unspecified, unspecified trimester: Secondary | ICD-10-CM

## 2013-11-28 DIAGNOSIS — O24313 Unspecified pre-existing diabetes mellitus in pregnancy, third trimester: Secondary | ICD-10-CM

## 2013-11-28 DIAGNOSIS — O34219 Maternal care for unspecified type scar from previous cesarean delivery: Secondary | ICD-10-CM

## 2013-11-28 DIAGNOSIS — O3090 Multiple gestation, unspecified, unspecified trimester: Secondary | ICD-10-CM

## 2013-11-28 DIAGNOSIS — Z789 Other specified health status: Secondary | ICD-10-CM

## 2013-11-28 DIAGNOSIS — Z758 Other problems related to medical facilities and other health care: Secondary | ICD-10-CM

## 2013-11-28 DIAGNOSIS — O09523 Supervision of elderly multigravida, third trimester: Secondary | ICD-10-CM

## 2013-11-28 DIAGNOSIS — O3660X0 Maternal care for excessive fetal growth, unspecified trimester, not applicable or unspecified: Secondary | ICD-10-CM

## 2013-11-28 DIAGNOSIS — O09529 Supervision of elderly multigravida, unspecified trimester: Secondary | ICD-10-CM

## 2013-11-28 DIAGNOSIS — E119 Type 2 diabetes mellitus without complications: Secondary | ICD-10-CM

## 2013-11-28 DIAGNOSIS — O3663X1 Maternal care for excessive fetal growth, third trimester, fetus 1: Secondary | ICD-10-CM

## 2013-11-28 LAB — POCT URINALYSIS DIP (DEVICE)
Bilirubin Urine: NEGATIVE
Glucose, UA: 500 mg/dL — AB
Hgb urine dipstick: NEGATIVE
KETONES UR: 15 mg/dL — AB
LEUKOCYTES UA: NEGATIVE
Nitrite: NEGATIVE
PH: 5.5 (ref 5.0–8.0)
PROTEIN: 100 mg/dL — AB
Specific Gravity, Urine: 1.02 (ref 1.005–1.030)
UROBILINOGEN UA: 0.2 mg/dL (ref 0.0–1.0)

## 2013-11-28 NOTE — Patient Instructions (Signed)
Eleccin del mtodo anticonceptivo (Contraception Choices) La anticoncepcin (control de la natalidad) es el uso de cualquier mtodo o dispositivo para evitar el embarazo. A continuacin se indican algunos de esos mtodos. MTODOS HORMONALES   El Implante contraconceptivo consiste en un tubo plstico delgado que contiene la hormona progesterona. No contiene estrgenos. El mdico inserta el tubo en la parte interna del brazo. El tubo puede permanecer en el lugar durante 3 aos. Despus de los 3 aos debe retirarse. El implante impide que los ovarios liberen vulos (ovulacin), espesa el moco cervical, lo que evita que los espermatozoides ingresen al tero y hace ms delgada la membrana que cubre el interior del tero.  Inyecciones de progesterona sola: las administra el mdico cada 3 meses para evitar el embarazo. La progesterona sinttica impide que los ovarios liberen vulos. Tambin hacen que el moco cervical se espese y modifique el tejido de recubrimiento interno del tero. Esto hace ms difcil que los espermatozoides sobrevivan en el tero.  Las pldoras anticonceptivas contienen estrgenos y progesterona. Su funcin es evitar que los ovarios liberen vulos (ovulacin). Las hormonas de los anticonceptivos orales hacen que el moco cervical se haga ms espeso, lo que evita que el esperma ingrese al tero. Las pldoras anticonceptivas son recetadas por el mdico.Tambin se utilizan para tratar los perodos menstruales abundantes.  Minipldora: este tipo de pldora anticonceptiva contiene slo hormona progesterona. Deben tomarse todos los das del mes y debe recetarlas el mdico.  El parche de control de natalidad: contiene hormonas similares a las que contienen las pldoras anticonceptivas. Deben cambiarse una vez por semana y se utilizan bajo prescripcin mdica.  Anillo vaginal: contiene hormonas similares a las que contienen las pldoras anticonceptivas. Se deja colocado durante tres semanas,  se lo retira durante 1 semana y luego se coloca uno nuevo. La paciente debe sentirse cmoda al insertar y retirar el anillo de la vagina.Es necesaria la prescripcin mdica.  Anticonceptivos de emergencia: son mtodos para evitar un embarazo despus de una relacin sexual sin proteccin. Esta pldora puede tomarse inmediatamente despus de tener relaciones sexuales o hasta 5 das de haber tenido sexo sin proteccin. Es ms efectiva si se toma poco tiempo despus de la relacin sexual. Los anticonceptivos de emergencia estn disponibles sin prescripcin mdica. Consltelo con su farmacutico. No use los anticonceptivos de emergencia como nico mtodo anticonceptivo. MTODOS DE BARRERA   Condn masculino: es una vaina delgada (ltex o goma) que se coloca cubriendo al pene durante el acto sexual. Puede usarse con espermicida para aumentar la efectividad.  Condn femenino. Es una funda delicada y blanda que se adapta holgadamente a la vagina antes de las relaciones sexuales.  Diafragma: es una barrera de ltex redonda y suave que debe ser recomendado por un profesional. Se inserta en la vagina, junto con un gel espermicida. Debe insertarse antes de tener relaciones sexuales. Debe dejar el diafragma colocado en la vagina durante 6 a 8 horas despus de la relacin sexual.  Capuchn cervical: es una barrera de ltex o taza plstica redonda y suave que cubre el cuello del tero y debe ser colocada por un mdico. Puede dejarlo colocado en la vagina hasta 48 horas despus de las relaciones sexuales.  Esponja: es una pieza blanda y circular de espuma de poliuretano. Contiene un espermicida. Se inserta en la vagina despus de mojarla y antes de las relaciones sexuales.  Espermicidas: son sustancias qumicas que matan o bloquean al esperma y no lo dejan ingresar al cuello del tero y al tero. Vienen   en forma de cremas, geles, supositorios, espuma o comprimidos. No es necesario tener receta mdica. Se insertan en  la vagina con un aplicador antes de tener relaciones sexuales. El proceso debe repetirse cada vez que tiene relaciones sexuales. ANTICONCEPTIVOS INTRAUTERINOS  Dispositivo intrauterino (DIU) es un dispositivo en forma de T que se coloca en el tero durante el perodo menstrual, para evitar el embarazo. Hay dos tipos:  DIU de cobre: este tipo de DIU est recubierto con un alambre de cobre y se inserta dentro del tero. El cobre hace que el tero y las trompas de Falopio produzcan un liquido que destruye los espermatozoides. Puede permanecer colocado durante 10 aos.  DIU con hormona: este tipo de DIU contiene la hormona progestina (progesterona sinttica). La hormona espesa el moco cervical y evita que los espermatozoides ingresen al tero y tambin afina la membrana que cubre el tero para evitar la implantacin del vulo fertilizado. La hormona debilita o destruye los espermatozoides que ingresan al tero. Puede permanecer en el lugar durante 3-5 aos, segn el tipo de DIU que se utilice. MTODOS ANTICONCEPTIVOS PERMANENTES  Ligadura de trompas en la mujer: se realiza sellando, atando u obstruyendo quirrgicamente las trompas de Falopio lo que impide que el vulo descienda hacia el tero.  Esterilizacin histeroscpica: Implica la colocacin de un pequeo espiral o la insercin en cada trompa de Falopio. El mdico utiliza una tcnica llamada histeroscopa para realizar este procedimiento. El dispositivo produce la formacin de tejido cicatrizal. Esto da como resultado una obstruccin permanente de las trompas de Falopio, de modo que la esperma no pueda fertilizar el vulo. Demora alrededor de 3 meses despus del procedimiento hasta que el conducto se obstruye. Tendr que usar otro mtodo anticonceptivo durante al menos 3 meses.  Esterilizacin masculina: se realiza ligando los conductos por los que pasan los espermatozoides (vasectoma).Esto impide que el esperma ingrese a la vagina durante el acto  sexual. Luego del procedimiento, el hombre puede eyacular lquido (semen). MTODOS DE PLANIFICACIN NATURAL  Planificacin familiar natural: consiste en no tener relaciones sexuales o usar un mtodo de barrera (condn, diafragma, capuchn cervical) en los das que la mujer podra quedar embarazada.  Mtodo de calendario: consiste en el seguimiento de la duracin de cada ciclo menstrual y la identificacin de los perodos frtiles.  Mtodo de ovulacin: consiste en evitar las relaciones sexuales durante la ovulacin.  Mtodo sintotrmico: consiste en evitar las relaciones sexuales en la poca en la que se est ovulando, utilizando un termmetro y tendiendo en cuenta los sntomas de la ovulacin.  Mtodo postovulacin: consiste en planificar las relaciones sexuales para despus de haber ovulado. Independientemente del tipo o mtodo anticonceptivo que usted elija, es importante que use condones para protegerse contra las infecciones de transmisin sexual (ETS). Hable con su mdico con respecto a qu mtodo anticonceptivo es el ms apropiado para usted. Document Released: 03/31/2005 Document Revised: 12/01/2012 ExitCare Patient Information 2015 [employer]. This information is not intended to replace advice given to you by your health care provider. Make sure you discuss any questions you have with your health care provider.  

## 2013-11-28 NOTE — Progress Notes (Signed)
US for growth scheduled on 8/20.   IOL scheduled 8/25 @ 0730.

## 2013-11-28 NOTE — Progress Notes (Signed)
Patient is doing well without complaints. CBGs all within range.  Growth ultrasound scheduled for 8/20 FM/labor precautions reviewed Patient undecided on contraception NST reviewed and reactive

## 2013-11-29 ENCOUNTER — Telehealth (HOSPITAL_COMMUNITY): Payer: Self-pay | Admitting: *Deleted

## 2013-11-29 NOTE — Telephone Encounter (Signed)
Preadmission screen  

## 2013-12-01 ENCOUNTER — Ambulatory Visit (INDEPENDENT_AMBULATORY_CARE_PROVIDER_SITE_OTHER): Payer: Self-pay | Admitting: *Deleted

## 2013-12-01 ENCOUNTER — Ambulatory Visit (HOSPITAL_COMMUNITY)
Admission: RE | Admit: 2013-12-01 | Discharge: 2013-12-01 | Disposition: A | Discharge status: Home / Self Care | Payer: Medicaid Other | Source: Ambulatory Visit | Attending: Obstetrics & Gynecology | Admitting: Obstetrics & Gynecology

## 2013-12-01 VITALS — BP 119/70 | HR 83 | Wt 179.7 lb

## 2013-12-01 DIAGNOSIS — E119 Type 2 diabetes mellitus without complications: Secondary | ICD-10-CM | POA: Insufficient documentation

## 2013-12-01 DIAGNOSIS — O09523 Supervision of elderly multigravida, third trimester: Secondary | ICD-10-CM

## 2013-12-01 DIAGNOSIS — O24919 Unspecified diabetes mellitus in pregnancy, unspecified trimester: Secondary | ICD-10-CM | POA: Insufficient documentation

## 2013-12-01 DIAGNOSIS — O09219 Supervision of pregnancy with history of pre-term labor, unspecified trimester: Secondary | ICD-10-CM | POA: Insufficient documentation

## 2013-12-01 DIAGNOSIS — O24313 Unspecified pre-existing diabetes mellitus in pregnancy, third trimester: Secondary | ICD-10-CM

## 2013-12-01 DIAGNOSIS — O09213 Supervision of pregnancy with history of pre-term labor, third trimester: Secondary | ICD-10-CM

## 2013-12-01 DIAGNOSIS — O09529 Supervision of elderly multigravida, unspecified trimester: Secondary | ICD-10-CM | POA: Insufficient documentation

## 2013-12-01 NOTE — Progress Notes (Signed)
NST reviewed and reactive.  

## 2013-12-03 ENCOUNTER — Encounter (HOSPITAL_COMMUNITY)
Admission: AD | Disposition: A | Discharge status: Home / Self Care | Payer: Self-pay | Source: Ambulatory Visit | Attending: Obstetrics & Gynecology

## 2013-12-03 ENCOUNTER — Inpatient Hospital Stay (HOSPITAL_COMMUNITY): Payer: Medicaid Other | Admitting: Anesthesiology

## 2013-12-03 ENCOUNTER — Encounter (HOSPITAL_COMMUNITY): Payer: Self-pay

## 2013-12-03 ENCOUNTER — Encounter (HOSPITAL_COMMUNITY): Payer: Medicaid Other | Admitting: Anesthesiology

## 2013-12-03 ENCOUNTER — Inpatient Hospital Stay (HOSPITAL_COMMUNITY)
Admission: AD | Admit: 2013-12-03 | Discharge: 2013-12-06 | DRG: 765 | Disposition: A | Discharge status: Home / Self Care | Payer: Medicaid Other | Source: Ambulatory Visit | Attending: Obstetrics & Gynecology | Admitting: Obstetrics & Gynecology

## 2013-12-03 DIAGNOSIS — O3430 Maternal care for cervical incompetence, unspecified trimester: Secondary | ICD-10-CM | POA: Diagnosis present

## 2013-12-03 DIAGNOSIS — Z794 Long term (current) use of insulin: Secondary | ICD-10-CM | POA: Diagnosis not present

## 2013-12-03 DIAGNOSIS — Z789 Other specified health status: Secondary | ICD-10-CM

## 2013-12-03 DIAGNOSIS — Z302 Encounter for sterilization: Secondary | ICD-10-CM

## 2013-12-03 DIAGNOSIS — O0990 Supervision of high risk pregnancy, unspecified, unspecified trimester: Secondary | ICD-10-CM

## 2013-12-03 DIAGNOSIS — O99214 Obesity complicating childbirth: Secondary | ICD-10-CM | POA: Diagnosis present

## 2013-12-03 DIAGNOSIS — Z833 Family history of diabetes mellitus: Secondary | ICD-10-CM | POA: Diagnosis not present

## 2013-12-03 DIAGNOSIS — D649 Anemia, unspecified: Secondary | ICD-10-CM | POA: Diagnosis present

## 2013-12-03 DIAGNOSIS — O479 False labor, unspecified: Secondary | ICD-10-CM | POA: Diagnosis present

## 2013-12-03 DIAGNOSIS — O34219 Maternal care for unspecified type scar from previous cesarean delivery: Principal | ICD-10-CM | POA: Diagnosis present

## 2013-12-03 DIAGNOSIS — E669 Obesity, unspecified: Secondary | ICD-10-CM | POA: Diagnosis present

## 2013-12-03 DIAGNOSIS — O2432 Unspecified pre-existing diabetes mellitus in childbirth: Secondary | ICD-10-CM | POA: Diagnosis present

## 2013-12-03 DIAGNOSIS — Z6841 Body Mass Index (BMI) 40.0 and over, adult: Secondary | ICD-10-CM

## 2013-12-03 DIAGNOSIS — O09529 Supervision of elderly multigravida, unspecified trimester: Secondary | ICD-10-CM | POA: Diagnosis present

## 2013-12-03 DIAGNOSIS — E119 Type 2 diabetes mellitus without complications: Secondary | ICD-10-CM | POA: Diagnosis present

## 2013-12-03 DIAGNOSIS — O1002 Pre-existing essential hypertension complicating childbirth: Secondary | ICD-10-CM | POA: Diagnosis present

## 2013-12-03 DIAGNOSIS — O9903 Anemia complicating the puerperium: Secondary | ICD-10-CM | POA: Diagnosis present

## 2013-12-03 DIAGNOSIS — O24313 Unspecified pre-existing diabetes mellitus in pregnancy, third trimester: Secondary | ICD-10-CM

## 2013-12-03 DIAGNOSIS — O3663X1 Maternal care for excessive fetal growth, third trimester, fetus 1: Secondary | ICD-10-CM

## 2013-12-03 DIAGNOSIS — O09523 Supervision of elderly multigravida, third trimester: Secondary | ICD-10-CM

## 2013-12-03 DIAGNOSIS — O09213 Supervision of pregnancy with history of pre-term labor, third trimester: Secondary | ICD-10-CM

## 2013-12-03 DIAGNOSIS — O26879 Cervical shortening, unspecified trimester: Secondary | ICD-10-CM

## 2013-12-03 LAB — CBC
HCT: 35.8 % — ABNORMAL LOW (ref 36.0–46.0)
HEMOGLOBIN: 11.7 g/dL — AB (ref 12.0–15.0)
MCH: 29.8 pg (ref 26.0–34.0)
MCHC: 32.7 g/dL (ref 30.0–36.0)
MCV: 91.3 fL (ref 78.0–100.0)
Platelets: 212 10*3/uL (ref 150–400)
RBC: 3.92 MIL/uL (ref 3.87–5.11)
RDW: 13.4 % (ref 11.5–15.5)
WBC: 9.8 10*3/uL (ref 4.0–10.5)

## 2013-12-03 LAB — PREPARE RBC (CROSSMATCH)

## 2013-12-03 LAB — GLUCOSE, CAPILLARY
GLUCOSE-CAPILLARY: 59 mg/dL — AB (ref 70–99)
GLUCOSE-CAPILLARY: 148 mg/dL — AB (ref 70–99)
Glucose-Capillary: 63 mg/dL — ABNORMAL LOW (ref 70–99)
Glucose-Capillary: 132 mg/dL — ABNORMAL HIGH (ref 70–99)
Glucose-Capillary: 72 mg/dL (ref 70–99)
Glucose-Capillary: 168 mg/dL — ABNORMAL HIGH (ref 70–99)

## 2013-12-03 LAB — RPR

## 2013-12-03 SURGERY — Surgical Case
Anesthesia: Epidural | Site: Abdomen

## 2013-12-03 MED ORDER — ONDANSETRON HCL 4 MG PO TABS: 4.0000 mg | ORAL_TABLET | ORAL | Status: DC | PRN

## 2013-12-03 MED ORDER — METOCLOPRAMIDE HCL 5 MG/ML IJ SOLN: 10.0000 mg | Freq: Three times a day (TID) | INTRAMUSCULAR | Status: DC | PRN

## 2013-12-03 MED ORDER — BUPIVACAINE HCL (PF) 0.5 % IJ SOLN
INTRAMUSCULAR | Status: AC
  Filled 2013-12-03: qty 30

## 2013-12-03 MED ORDER — CEFAZOLIN SODIUM-DEXTROSE 2-3 GM-% IV SOLR
INTRAVENOUS | Status: DC | PRN
  Administered 2013-12-03: 2 g via INTRAVENOUS

## 2013-12-03 MED ORDER — SODIUM BICARBONATE 8.4 % IV SOLN
INTRAVENOUS | Status: AC
  Filled 2013-12-03: qty 50

## 2013-12-03 MED ORDER — LIDOCAINE HCL (PF) 1 % IJ SOLN: 30.0000 mL | INTRAMUSCULAR | Status: DC | PRN

## 2013-12-03 MED ORDER — ONDANSETRON HCL 4 MG/2ML IJ SOLN: 4.0000 mg | Freq: Four times a day (QID) | INTRAMUSCULAR | Status: DC | PRN

## 2013-12-03 MED ORDER — SODIUM CHLORIDE 0.9 % IJ SOLN: 3.0000 mL | INTRAMUSCULAR | Status: DC | PRN

## 2013-12-03 MED ORDER — ACETAMINOPHEN 325 MG PO TABS: 650.0000 mg | ORAL_TABLET | ORAL | Status: DC | PRN

## 2013-12-03 MED ORDER — OXYTOCIN 10 UNIT/ML IJ SOLN
40.0000 [IU] | INTRAVENOUS | Status: DC | PRN
  Administered 2013-12-03: 40 [IU] via INTRAVENOUS

## 2013-12-03 MED ORDER — LACTATED RINGERS IV SOLN
INTRAVENOUS | Status: DC
  Administered 2013-12-03: 07:00:00 via INTRAVENOUS

## 2013-12-03 MED ORDER — NALBUPHINE HCL 10 MG/ML IJ SOLN: 5.0000 mg | INTRAMUSCULAR | Status: DC | PRN

## 2013-12-03 MED ORDER — PNEUMOCOCCAL VAC POLYVALENT 25 MCG/0.5ML IJ INJ
0.5000 mL | INJECTION | INTRAMUSCULAR | Status: AC
  Administered 2013-12-04: 0.5 mL via INTRAMUSCULAR
  Filled 2013-12-03: qty 0.5

## 2013-12-03 MED ORDER — MORPHINE SULFATE 0.5 MG/ML IJ SOLN
INTRAMUSCULAR | Status: AC
  Filled 2013-12-03: qty 10

## 2013-12-03 MED ORDER — LANOLIN HYDROUS EX OINT: 1.0000 "application " | TOPICAL_OINTMENT | CUTANEOUS | Status: DC | PRN

## 2013-12-03 MED ORDER — LACTATED RINGERS IV SOLN
INTRAVENOUS | Status: DC | PRN
  Administered 2013-12-03: 11:00:00 via INTRAVENOUS

## 2013-12-03 MED ORDER — SIMETHICONE 80 MG PO CHEW: 80.0000 mg | CHEWABLE_TABLET | ORAL | Status: DC | PRN

## 2013-12-03 MED ORDER — PRENATAL MULTIVITAMIN CH
1.0000 | ORAL_TABLET | Freq: Every day | ORAL | Status: DC
  Administered 2013-12-04 – 2013-12-05 (×2): 1 via ORAL
  Filled 2013-12-03 (×2): qty 1

## 2013-12-03 MED ORDER — SIMETHICONE 80 MG PO CHEW
80.0000 mg | CHEWABLE_TABLET | ORAL | Status: DC
  Administered 2013-12-03 – 2013-12-05 (×3): 80 mg via ORAL
  Filled 2013-12-03 (×3): qty 1

## 2013-12-03 MED ORDER — FENTANYL 2.5 MCG/ML BUPIVACAINE 1/10 % EPIDURAL INFUSION (WH - ANES): 12.0000 mL/h | INTRAMUSCULAR | Status: DC | PRN

## 2013-12-03 MED ORDER — NALOXONE HCL 0.4 MG/ML IJ SOLN: 0.4000 mg | INTRAMUSCULAR | Status: DC | PRN

## 2013-12-03 MED ORDER — LACTATED RINGERS IV SOLN
INTRAVENOUS | Status: DC | PRN
  Administered 2013-12-03 (×3): via INTRAVENOUS

## 2013-12-03 MED ORDER — MENTHOL 3 MG MT LOZG: 1.0000 | LOZENGE | OROMUCOSAL | Status: DC | PRN

## 2013-12-03 MED ORDER — FENTANYL CITRATE 0.05 MG/ML IJ SOLN: 100.0000 ug | Freq: Once | INTRAMUSCULAR | Status: DC

## 2013-12-03 MED ORDER — MEPERIDINE HCL 25 MG/ML IJ SOLN: 6.2500 mg | INTRAMUSCULAR | Status: DC | PRN

## 2013-12-03 MED ORDER — IBUPROFEN 600 MG PO TABS: 600.0000 mg | ORAL_TABLET | Freq: Four times a day (QID) | ORAL | Status: DC | PRN

## 2013-12-03 MED ORDER — TETANUS-DIPHTH-ACELL PERTUSSIS 5-2.5-18.5 LF-MCG/0.5 IM SUSP
0.5000 mL | Freq: Once | INTRAMUSCULAR | Status: AC
  Administered 2013-12-04: 0.5 mL via INTRAMUSCULAR
  Filled 2013-12-03: qty 0.5

## 2013-12-03 MED ORDER — ZOLPIDEM TARTRATE 5 MG PO TABS: 5.0000 mg | ORAL_TABLET | Freq: Every evening | ORAL | Status: DC | PRN

## 2013-12-03 MED ORDER — FENTANYL 2.5 MCG/ML BUPIVACAINE 1/10 % EPIDURAL INFUSION (WH - ANES)
14.0000 mL/h | INTRAMUSCULAR | Status: DC | PRN
  Administered 2013-12-03: 14 mL/h via EPIDURAL
  Filled 2013-12-03: qty 125

## 2013-12-03 MED ORDER — IBUPROFEN 600 MG PO TABS
600.0000 mg | ORAL_TABLET | Freq: Four times a day (QID) | ORAL | Status: DC
  Administered 2013-12-03 – 2013-12-06 (×11): 600 mg via ORAL
  Filled 2013-12-03 (×11): qty 1

## 2013-12-03 MED ORDER — EPHEDRINE 5 MG/ML INJ: 10.0000 mg | INTRAVENOUS | Status: DC | PRN

## 2013-12-03 MED ORDER — HYDROMORPHONE HCL PF 1 MG/ML IJ SOLN: 0.2500 mg | INTRAMUSCULAR | Status: DC | PRN

## 2013-12-03 MED ORDER — OXYCODONE-ACETAMINOPHEN 5-325 MG PO TABS
1.0000 | ORAL_TABLET | ORAL | Status: DC | PRN
  Administered 2013-12-05 (×2): 1 via ORAL
  Administered 2013-12-06: 2 via ORAL
  Filled 2013-12-03: qty 2
  Filled 2013-12-03 (×2): qty 1

## 2013-12-03 MED ORDER — WITCH HAZEL-GLYCERIN EX PADS: 1.0000 "application " | MEDICATED_PAD | CUTANEOUS | Status: DC | PRN

## 2013-12-03 MED ORDER — SCOPOLAMINE 1 MG/3DAYS TD PT72
MEDICATED_PATCH | TRANSDERMAL | Status: AC
  Administered 2013-12-03: 1.5 mg via TRANSDERMAL
  Filled 2013-12-03: qty 1

## 2013-12-03 MED ORDER — MORPHINE SULFATE (PF) 0.5 MG/ML IJ SOLN
INTRAMUSCULAR | Status: DC | PRN
  Administered 2013-12-03: 4 mg via EPIDURAL

## 2013-12-03 MED ORDER — KETOROLAC TROMETHAMINE 60 MG/2ML IM SOLN
60.0000 mg | Freq: Once | INTRAMUSCULAR | Status: AC | PRN
  Administered 2013-12-03: 60 mg via INTRAMUSCULAR

## 2013-12-03 MED ORDER — SENNOSIDES-DOCUSATE SODIUM 8.6-50 MG PO TABS
2.0000 | ORAL_TABLET | ORAL | Status: DC
  Administered 2013-12-03 – 2013-12-05 (×3): 2 via ORAL
  Filled 2013-12-03 (×3): qty 2

## 2013-12-03 MED ORDER — OXYTOCIN BOLUS FROM INFUSION: 500.0000 mL | INTRAVENOUS | Status: DC

## 2013-12-03 MED ORDER — NALOXONE HCL 1 MG/ML IJ SOLN
1.0000 ug/kg/h | INTRAVENOUS | Status: DC | PRN
  Filled 2013-12-03: qty 2

## 2013-12-03 MED ORDER — DIPHENHYDRAMINE HCL 50 MG/ML IJ SOLN: 12.5000 mg | INTRAMUSCULAR | Status: DC | PRN

## 2013-12-03 MED ORDER — OXYTOCIN 10 UNIT/ML IJ SOLN
INTRAMUSCULAR | Status: AC
  Filled 2013-12-03: qty 4

## 2013-12-03 MED ORDER — PHENYLEPHRINE 40 MCG/ML (10ML) SYRINGE FOR IV PUSH (FOR BLOOD PRESSURE SUPPORT)
PREFILLED_SYRINGE | INTRAVENOUS | Status: AC
  Filled 2013-12-03: qty 5

## 2013-12-03 MED ORDER — CEFAZOLIN SODIUM-DEXTROSE 2-3 GM-% IV SOLR
INTRAVENOUS | Status: AC
  Filled 2013-12-03: qty 50

## 2013-12-03 MED ORDER — FENTANYL 2.5 MCG/ML BUPIVACAINE 1/10 % EPIDURAL INFUSION (WH - ANES)
INTRAMUSCULAR | Status: DC | PRN
  Administered 2013-12-03: 12 mL/h via EPIDURAL

## 2013-12-03 MED ORDER — LIDOCAINE-EPINEPHRINE (PF) 2 %-1:200000 IJ SOLN
INTRAMUSCULAR | Status: AC
  Filled 2013-12-03: qty 20

## 2013-12-03 MED ORDER — DIPHENHYDRAMINE HCL 50 MG/ML IJ SOLN: 25.0000 mg | INTRAMUSCULAR | Status: DC | PRN

## 2013-12-03 MED ORDER — BUPIVACAINE HCL (PF) 0.5 % IJ SOLN
INTRAMUSCULAR | Status: DC | PRN
  Administered 2013-12-03: 30 mL

## 2013-12-03 MED ORDER — PHENYLEPHRINE 40 MCG/ML (10ML) SYRINGE FOR IV PUSH (FOR BLOOD PRESSURE SUPPORT)
80.0000 ug | PREFILLED_SYRINGE | INTRAVENOUS | Status: AC | PRN
  Administered 2013-12-03: 80 ug via INTRAVENOUS
  Administered 2013-12-03 (×2): 40 ug via INTRAVENOUS

## 2013-12-03 MED ORDER — OXYCODONE-ACETAMINOPHEN 5-325 MG PO TABS: 1.0000 | ORAL_TABLET | ORAL | Status: DC | PRN

## 2013-12-03 MED ORDER — LACTATED RINGERS IV SOLN
INTRAVENOUS | Status: DC
Start: 2013-12-03 — End: 2013-12-05

## 2013-12-03 MED ORDER — ONDANSETRON HCL 4 MG/2ML IJ SOLN: 4.0000 mg | INTRAMUSCULAR | Status: DC | PRN

## 2013-12-03 MED ORDER — LACTATED RINGERS IV SOLN: 500.0000 mL | INTRAVENOUS | Status: DC | PRN

## 2013-12-03 MED ORDER — KETOROLAC TROMETHAMINE 30 MG/ML IJ SOLN: 30.0000 mg | Freq: Four times a day (QID) | INTRAMUSCULAR | Status: AC | PRN

## 2013-12-03 MED ORDER — LIDOCAINE HCL (PF) 1 % IJ SOLN
INTRAMUSCULAR | Status: DC | PRN
  Administered 2013-12-03 (×2): 4 mL

## 2013-12-03 MED ORDER — ONDANSETRON HCL 4 MG/2ML IJ SOLN
INTRAMUSCULAR | Status: AC
  Filled 2013-12-03: qty 2

## 2013-12-03 MED ORDER — DEXTROSE 50 % IV SOLN
INTRAVENOUS | Status: AC
  Administered 2013-12-03: 30 mL via INTRAVENOUS
  Filled 2013-12-03: qty 50

## 2013-12-03 MED ORDER — SODIUM BICARBONATE 8.4 % IV SOLN
INTRAVENOUS | Status: DC | PRN
  Administered 2013-12-03 (×3): 5 mL via EPIDURAL

## 2013-12-03 MED ORDER — PHENYLEPHRINE 40 MCG/ML (10ML) SYRINGE FOR IV PUSH (FOR BLOOD PRESSURE SUPPORT)
80.0000 ug | PREFILLED_SYRINGE | INTRAVENOUS | Status: DC | PRN
  Filled 2013-12-03: qty 10

## 2013-12-03 MED ORDER — LACTATED RINGERS IV SOLN: 500.0000 mL | Freq: Once | INTRAVENOUS | Status: DC

## 2013-12-03 MED ORDER — ONDANSETRON HCL 4 MG/2ML IJ SOLN
INTRAMUSCULAR | Status: DC | PRN
  Administered 2013-12-03: 4 mg via INTRAVENOUS

## 2013-12-03 MED ORDER — DIPHENHYDRAMINE HCL 25 MG PO CAPS: 25.0000 mg | ORAL_CAPSULE | ORAL | Status: DC | PRN

## 2013-12-03 MED ORDER — OXYTOCIN 40 UNITS IN LACTATED RINGERS INFUSION - SIMPLE MED: 62.5000 mL/h | INTRAVENOUS | Status: AC

## 2013-12-03 MED ORDER — PROMETHAZINE HCL 25 MG/ML IJ SOLN: 6.2500 mg | INTRAMUSCULAR | Status: DC | PRN

## 2013-12-03 MED ORDER — ONDANSETRON HCL 4 MG/2ML IJ SOLN: 4.0000 mg | Freq: Three times a day (TID) | INTRAMUSCULAR | Status: DC | PRN

## 2013-12-03 MED ORDER — SCOPOLAMINE 1 MG/3DAYS TD PT72
1.0000 | MEDICATED_PATCH | Freq: Once | TRANSDERMAL | Status: AC
  Administered 2013-12-03: 1.5 mg via TRANSDERMAL

## 2013-12-03 MED ORDER — DEXTROSE 50 % IV SOLN
25.0000 mL | Freq: Once | INTRAVENOUS | Status: AC
  Administered 2013-12-03: 30 mL via INTRAVENOUS

## 2013-12-03 MED ORDER — OXYTOCIN 40 UNITS IN LACTATED RINGERS INFUSION - SIMPLE MED: 62.5000 mL/h | INTRAVENOUS | Status: DC

## 2013-12-03 MED ORDER — DIPHENHYDRAMINE HCL 25 MG PO CAPS: 25.0000 mg | ORAL_CAPSULE | Freq: Four times a day (QID) | ORAL | Status: DC | PRN

## 2013-12-03 MED ORDER — SIMETHICONE 80 MG PO CHEW
80.0000 mg | CHEWABLE_TABLET | Freq: Three times a day (TID) | ORAL | Status: DC
  Administered 2013-12-04 – 2013-12-06 (×6): 80 mg via ORAL
  Filled 2013-12-03 (×7): qty 1

## 2013-12-03 MED ORDER — DIBUCAINE 1 % RE OINT: 1.0000 "application " | TOPICAL_OINTMENT | RECTAL | Status: DC | PRN

## 2013-12-03 MED ORDER — CITRIC ACID-SODIUM CITRATE 334-500 MG/5ML PO SOLN
30.0000 mL | ORAL | Status: DC | PRN
  Administered 2013-12-03: 30 mL via ORAL
  Filled 2013-12-03: qty 15

## 2013-12-03 MED ORDER — KETOROLAC TROMETHAMINE 60 MG/2ML IM SOLN
INTRAMUSCULAR | Status: AC
  Administered 2013-12-03: 60 mg via INTRAMUSCULAR
  Filled 2013-12-03: qty 2

## 2013-12-03 SURGICAL SUPPLY — 36 items
BARRIER ADHS 3X4 INTERCEED (GAUZE/BANDAGES/DRESSINGS) IMPLANT
BLADE SURG 10 STRL SS (BLADE) ×6 IMPLANT
CLAMP CORD UMBIL (MISCELLANEOUS) IMPLANT
CLIP FILSHIE TUBAL LIGA STRL (Clip) ×3 IMPLANT
CLOTH BEACON ORANGE TIMEOUT ST (SAFETY) ×3 IMPLANT
CONTAINER PREFILL 10% NBF 15ML (MISCELLANEOUS) IMPLANT
DRAPE LG THREE QUARTER DISP (DRAPES) IMPLANT
DRSG OPSITE POSTOP 4X10 (GAUZE/BANDAGES/DRESSINGS) ×3 IMPLANT
DURAPREP 26ML APPLICATOR (WOUND CARE) ×3 IMPLANT
ELECT REM PT RETURN 9FT ADLT (ELECTROSURGICAL) ×3
ELECTRODE REM PT RTRN 9FT ADLT (ELECTROSURGICAL) ×1 IMPLANT
EXTRACTOR VACUUM KIWI (MISCELLANEOUS) IMPLANT
GLOVE BIO SURGEON STRL SZ 6.5 (GLOVE) ×2 IMPLANT
GLOVE BIO SURGEONS STRL SZ 6.5 (GLOVE) ×1
GOWN STRL REUS W/TWL LRG LVL3 (GOWN DISPOSABLE) ×6 IMPLANT
KIT ABG SYR 3ML LUER SLIP (SYRINGE) IMPLANT
NEEDLE HYPO 25X5/8 SAFETYGLIDE (NEEDLE) IMPLANT
NEEDLE SPNL 18GX3.5 QUINCKE PK (NEEDLE) ×3 IMPLANT
NS IRRIG 1000ML POUR BTL (IV SOLUTION) ×3 IMPLANT
PACK C SECTION WH (CUSTOM PROCEDURE TRAY) ×3 IMPLANT
PAD OB MATERNITY 4.3X12.25 (PERSONAL CARE ITEMS) ×3 IMPLANT
SUT PDS AB 0 CTX 60 (SUTURE) IMPLANT
SUT VIC AB 0 CT1 27 (SUTURE)
SUT VIC AB 0 CT1 27XBRD ANBCTR (SUTURE) IMPLANT
SUT VIC AB 0 CT1 36 (SUTURE) IMPLANT
SUT VIC AB 2-0 CT1 27 (SUTURE) ×2
SUT VIC AB 2-0 CT1 TAPERPNT 27 (SUTURE) ×1 IMPLANT
SUT VIC AB 2-0 CTX 36 (SUTURE) ×6 IMPLANT
SUT VIC AB 3-0 CT1 27 (SUTURE) ×2
SUT VIC AB 3-0 CT1 TAPERPNT 27 (SUTURE) ×1 IMPLANT
SUT VIC AB 3-0 SH 27 (SUTURE)
SUT VIC AB 3-0 SH 27X BRD (SUTURE) IMPLANT
SYR 30ML LL (SYRINGE) ×3 IMPLANT
TOWEL OR 17X24 6PK STRL BLUE (TOWEL DISPOSABLE) ×3 IMPLANT
TRAY FOLEY CATH 14FR (SET/KITS/TRAYS/PACK) ×3 IMPLANT
WATER STERILE IRR 1000ML POUR (IV SOLUTION) ×3 IMPLANT

## 2013-12-03 NOTE — Progress Notes (Signed)
Patient ID: [name], female   DOB: [birth year], 38 y.o.   MRN: [id number]   Upon my arrival taking over the care of the patients on the Faculty practice service, I examined [name]. My EFW  By Leopold's exam was about 8 /12 pounds, larger than the baby that she experienced CPD with. I spoke with her about my findings and offered her a RLTCS as well as discussing continuing the TOLAC. After a discussion with her husband via phone, she opted for a RLTCS. She also would like permanent sterility in the form of a tubal ligation. She is aware of the 1/200 failure rate as well as the permanence of this procedure.  She understands the risks of surgery, including, but not to infection, bleeding, DVTs, damage to bowel, bladder, ureters. She wishes to proceed.

## 2013-12-03 NOTE — H&P (Signed)
Rachael IslamDoris Russo is a 38 y.o. female [phone] [id number] with IUP at 1750w4d presenting for SOL. Pt states she has been having regular contractions starting at 6am this morning, associated with none vaginal bleeding.  Membranes are ruptured (5 am), with active fetal movement.    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 Diabetes mellitus   . AMA (advanced maternal age) multigravida 35+ 05/09/2013    Offered genetic screening, discussed options, patient desires quad screen [facility]  Dating LMP consistent with 10 week scan  Genetic Screen  Quad:                  NIPS:  Anatomic US   GTT Type II DM  TDaP vaccine   Flu vaccine   GBS   Baby Food Breast  Contraception Condoms  Pediatrician [name]  Support Person Mother        Past Surgical History: Past Surgical History  Procedure Laterality Date  . Cesarean section      Obstetrical History: OB History   Grav Para Term Preterm Abortions TAB SAB Ect Mult Living   4 3 1 2  0     2      Social History: History   Social History  . Marital Status: Married    Spouse Name: N/A    Number of Children: N/A  . Years of Education: N/A   Social History Main Topics  . Smoking status: Never Smoker   . Smokeless tobacco: Not on file  . Alcohol Use: No  . Drug Use: No  . Sexual Activity: Yes    Birth Control/ Protection: Condom   Other Topics Concern  . Not on file   Social History Narrative  . No narrative on file    Family History: Family History  Problem Relation Age of Onset  . Diabetes Mother     Allergies: No Known Allergies  Prescriptions prior to admission  Medication Sig Dispense Refill  . aspirin 81 MG tablet Take 81 mg by mouth daily.      . insulin NPH Human (HUMULIN N,NOVOLIN N) 100 UNIT/ML injection  Inject 14-22 Units into the skin 2 (two) times daily before a meal. 22u qam, 20 u qhs      . insulin regular (NOVOLIN R,HUMULIN R) 100 units/mL injection Inject 0.12 mLs (12 Units total) into the skin 2 (two) times daily before a meal.  10 mL  6  . polyethylene glycol powder (GLYCOLAX/MIRALAX) powder Take 1 Container by mouth once.      . Prenatal Vit-Fe Fumarate-FA (PRENATAL MULTIVITAMIN) TABS tablet Take 1 tablet by mouth daily at 12 noon.      . progesterone (PROMETRIUM) 200 MG capsule Place 1 capsule (200 mg total) vaginally at bedtime.  30 capsule  5    Review of Systems: Negative unless otherwise stated in History above  Physicial Height 4\' 7"  (1.397 m), weight 81.194 kg (179 lb), last menstrual period 03/08/2013. General appearance: alert, cooperative and mild distress Presentation: cephalic Fetal monitoringBaseline: 135 bpm, Variability: Good {> 6 bpm), Accelerations: none and Decelerations: Absent Uterine activityFrequency: Every 3-5 minutes Dilation: 4.5 Effacement (%): 80 Exam by:: DCALLAWAY, RN  Prenatal labs: ABO, Rh: --/--/O POS (04/04 1125) Antibody: NEG (01/14 0840) Rubella:   RPR: NON REAC (06/15 1228)  HBsAg: NEGATIVE (01/14 0840)  HIV: NONREACTIVE (06/15 1228)  GBS: Negative (08/03 0000)  GTT: Class B DM  Prenatal Transfer Tool  Maternal Diabetes: Yes:  Diabetes Type:  Insulin/Medication controlled Genetic Screening: Normal Maternal Ultrasounds/Referrals: Normal Fetal Ultrasounds or other Referrals:  Fetal echo Maternal Substance Abuse:  No Significant Maternal Medications:  Meds include: Progesterone Other: Insulin, ASA Significant Maternal Lab Results: Lab values include: Group B Strep negative  No results found for this or any previous visit (from the past 24 hour(s)).  Assessment: Rachael Russo is a 38 y.o. [phone] at [redacted]w[redacted]d by here for SOL  #Labor: admit to birthing suite #Pain: Epidural on request #FWB: Cat 1 #ID:   GBS negative #Feeding:  breast #MOC:undecided #Circ:  No   Rachael Low MD Rachael Russo FM PGY-2 12/03/2013, 6:31 AM  I have participated in the care of this patient and I agree with the above. Further consent to take place between oncoming team re pt plans for VBAC vs comfortability with the providers as EFW is approaching the wt of the baby she had a C/S with. Rachael Russo CNM 9:15 AM 12/03/2013

## 2013-12-03 NOTE — Progress Notes (Addendum)
Given 120CC apple juice to drink after CBG 63 AT 0811.

## 2013-12-03 NOTE — Progress Notes (Signed)
Dr dove and dr Loreta Aveacosta at bs discussing risks of vbac and pt agrees to proceed with c/section. Pt calling husband to come back to hospital for csection. Pt desires pptl.

## 2013-12-03 NOTE — Anesthesia Preprocedure Evaluation (Signed)
Anesthesia Evaluation  Patient identified by MRN, date of birth, ID band Patient awake    Reviewed: Allergy & Precautions, H&P , NPO status , Patient's Chart, lab work & pertinent test results  Airway Mallampati: II TM Distance: >3 FB Neck ROM: Full    Dental no notable dental hx.    Pulmonary neg pulmonary ROS,  breath sounds clear to auscultation  Pulmonary exam normal       Cardiovascular hypertension, Rhythm:Regular Rate:Normal     Neuro/Psych negative neurological ROS  negative psych ROS   GI/Hepatic negative GI ROS, Neg liver ROS,   Endo/Other  diabetes, Type 2, Insulin DependentMorbid obesity  Renal/GU negative Renal ROS     Musculoskeletal negative musculoskeletal ROS (+)   Abdominal   Peds  Hematology negative hematology ROS (+)   Anesthesia Other Findings   Reproductive/Obstetrics negative OB ROS                           Anesthesia Physical Anesthesia Plan  ASA: III  Anesthesia Plan: Epidural   Post-op Pain Management:    Induction:   Airway Management Planned:   Additional Equipment:   Intra-op Plan:   Post-operative Plan:   Informed Consent: I have reviewed the patients History and Physical, chart, labs and discussed the procedure including the risks, benefits and alternatives for the proposed anesthesia with the patient or authorized representative who has indicated his/her understanding and acceptance.     Plan Discussed with:   Anesthesia Plan Comments:         Anesthesia Quick Evaluation

## 2013-12-03 NOTE — MAU Note (Signed)
PT ARRIVED -IN W/C TO RM 9- VERY UNCOMFORTABLE  - ARCHES BACK WITH UC.     SAYS SROM -0500

## 2013-12-03 NOTE — Anesthesia Procedure Notes (Signed)
Epidural Patient location during procedure: OB  Staffing Anesthesiologist: Dyquan Minks R Performed by: anesthesiologist   Preanesthetic Checklist Completed: patient identified, pre-op evaluation, timeout performed, IV checked, risks and benefits discussed and monitors and equipment checked  Epidural Patient position: sitting Prep: site prepped and draped and DuraPrep Patient monitoring: heart rate Approach: midline Location: L3-L4 Injection technique: LOR air and LOR saline  Needle:  Needle type: Tuohy  Needle gauge: 17 G Needle length: 9 cm Needle insertion depth: 5 cm Catheter type: closed end flexible Catheter size: 19 Gauge Catheter at skin depth: 11 cm Test dose: negative  Assessment Sensory level: T8 Events: blood not aspirated, injection not painful, no injection resistance, negative IV test and no paresthesia  Additional Notes Reason for block:procedure for pain   

## 2013-12-03 NOTE — Progress Notes (Signed)
Assistant  Rosebud Poles RN, and Dr Wenda Low  With spanish interpretation to a patient in room#173, by Orlan Leavens , Spanish Interpreter

## 2013-12-03 NOTE — Op Note (Signed)
12/03/2013  11:47 AM  PATIENT:  Rachael Russo  38 y.o. female  PRE-OPERATIVE DIAGNOSIS:  desires repeat cesarean section, desires sterilization  POST-OPERATIVE DIAGNOSIS:  desires repeat cesarean section, desires sterilization  PROCEDURE:  Procedure(s): CESAREAN SECTION (N/A)  SURGEON:  Surgeon(s) and Role:    * Allie BossierMyra C Alroy Portela, MD - Primary     PHYSICIAN ASSISTANT:   ASSISTANTS: Doristine BosworthKrisi Acosta, MD   ANESTHESIA:   epidural  EBL:  Total I/O In: 2000 [I.V.:2000] Out: 1300 [Urine:400; Blood:900]  BLOOD ADMINISTERED:none  DRAINS: none   LOCAL MEDICATIONS USED:  MARCAINE     SPECIMEN:  Source of Specimen:  cord blood  DISPOSITION OF SPECIMEN:  PATHOLOGY  COUNTS:  YES  TOURNIQUET:  * No tourniquets in log *  DICTATION: .Dragon Dictation  PLAN OF CARE: Admit to inpatient   PATIENT DISPOSITION:  PACU - hemodynamically stable.   Delay start of Pharmacological VTE agent (>24hrs) due to surgical blood loss or risk of bleeding: n/a  The risks, benefits, and alternatives of surgery were explained, understood, accepted. Consents were signed. All questions were answered. Her epidural was bolused for surgery.Her abdomen was prepped and draped in the usual sterile fashion. A Foley catheter had already been placed, draining clear urine throughout case. Timeout procedure was done. After adequate anesthesia was assured 30 mL for 0.5% Marcaine was injected into the subcutaneous tissue at the site of her previous cesarean. An incision was made through the previous incision. The incision was carried down through the subcutaneous tissue to the fascia. The fascia was scored the midline and extended bilaterally. The middle 10% of the rectus muscles were separated in a transverse fashion using electrosurgical technique. Excellent hemostasis was maintained. The peritoneum was entered with hemostats. Peritoneal incision was extended bilaterally with the Bovie. The bladder blade was placed. A transverse  incision was made on the well-developed lower uterine segment. The uterine incision was extended with traction on each side. Amniotomy was performed with a hemostat. Clear fluid was noted. The baby was delivered from a vertex presentation.the mouth and nostrils were suctioned prior to delivery of the shoulders.  The baby's cord was clamped and cut and was transferred to the NICU personnel for routine care. The placenta was delivered intact with traction. The uterus was left in situ and the interior was cleaned with a dry lap sponge. The uterine incision was closed with 2-0 Vicryl running locking suture. Excellent hemostasis was noted. By tilting the uterus each side was able to visualize the adnexa, and they were normal. A Fische clip was placed across the each oviduct entirely in the isthmic region.The rectus fascia rectus muscles were noted be hemostatic as well. The uterine incision was again inspected and noted to be hemostatic. The fascia was closed with a #1 PDS loop in a running nonlocking fashion. No defects were palpable. The subcutaneous tissue was irrigated, clean, and dried. A subcuticular closure was done with a 3-0 Vicryl suture. Steri-Strips are placed. Excellent cosmetic results were obtained. She was taken to the recovery room in stable condition. She tolerated the procedure well.

## 2013-12-03 NOTE — Anesthesia Postprocedure Evaluation (Signed)
Anesthesia Post Note  Patient: Rachael Russo  Procedure(s) Performed: Procedure(s) (LRB): CESAREAN SECTION (N/A)  Anesthesia type: Epidural  Patient location: PACU  Post pain: Pain level controlled  Post assessment: Post-op Vital signs reviewed  Last Vitals: BP 95/52  Pulse 70  Temp(Src) 37.4 C (Oral)  Resp 16  Ht 4\' 7"  (1.397 m)  Wt 179 lb (81.194 kg)  BMI 41.60 kg/m2  SpO2 97%  LMP 03/08/2013  Breastfeeding? Unknown  Post vital signs: Reviewed  Level of consciousness: sedated  Complications: No apparent anesthesia complications

## 2013-12-03 NOTE — Transfer of Care (Signed)
Immediate Anesthesia Transfer of Care Note  Patient: Rachael Russo  Procedure(s) Performed: Procedure(s): CESAREAN SECTION (N/A)  Patient Location: PACU  Anesthesia Type:Epidural  Level of Consciousness: awake, alert  and oriented  Airway & Oxygen Therapy: Patient Spontanous Breathing  Post-op Assessment: Report given to PACU RN and Post -op Vital signs reviewed and stable  Post vital signs: Reviewed and stable  Complications: No apparent anesthesia complications

## 2013-12-04 LAB — CBC
HCT: 26.7 % — ABNORMAL LOW (ref 36.0–46.0)
Hemoglobin: 8.8 g/dL — ABNORMAL LOW (ref 12.0–15.0)
MCH: 30 pg (ref 26.0–34.0)
MCHC: 33 g/dL (ref 30.0–36.0)
MCV: 91.1 fL (ref 78.0–100.0)
PLATELETS: 187 10*3/uL (ref 150–400)
RBC: 2.93 MIL/uL — AB (ref 3.87–5.11)
RDW: 13.3 % (ref 11.5–15.5)
WBC: 13 10*3/uL — ABNORMAL HIGH (ref 4.0–10.5)

## 2013-12-04 LAB — GLUCOSE, CAPILLARY
GLUCOSE-CAPILLARY: 125 mg/dL — AB (ref 70–99)
Glucose-Capillary: 182 mg/dL — ABNORMAL HIGH (ref 70–99)
Glucose-Capillary: 159 mg/dL — ABNORMAL HIGH (ref 70–99)
Glucose-Capillary: 209 mg/dL — ABNORMAL HIGH (ref 70–99)

## 2013-12-04 NOTE — Anesthesia Postprocedure Evaluation (Signed)
Anesthesia Post Note  Patient: Rachael Russo  Procedure(s) Performed: Procedure(s) (LRB): CESAREAN SECTION (N/A)  Anesthesia type: Epidural  Patient location: Mother/Baby  Post pain: Pain level controlled  Post assessment: Post-op Vital signs reviewed  Last Vitals:  Filed Vitals:   12/04/13 0531  BP: 112/48  Pulse: 57  Temp: 36.9 C  Resp: 16    Post vital signs: Reviewed  Level of consciousness:alert  Complications: No apparent anesthesia complications

## 2013-12-04 NOTE — Progress Notes (Signed)
Post Partum Day 1 Subjective:  Rachael Russo is a 38 y.o. [phone] s/p SVD.  No acute events overnight.  Pt denies problems with ambulating, voiding or po intake.  She  denies nausea or vomiting.  Pain is well controlled.  She has had flatus. She has had bowel movement.  Lochia Moderate.  Method of Feeding: br/bo  Objective: Blood pressure 90/54, pulse 88, temperature 97.5 F (36.4 C), temperature source Oral, resp. rate 18, height  (1.397 m), weight 179 lb (81.194 kg), last menstrual period 03/08/2013, SpO2 100.00%, unknown if currently breastfeeding.  Physical Exam:  General: alert, cooperative and no distress Lochia:normal flow Chest: CTAB Heart: RRR no m/r/g Abdomen: +BS, soft, nontender,  Uterine Fundus: firm DVT Evaluation: No evidence of DVT seen on physical exam. Extremities: no edema   Recent Labs  12/03/13 0605 12/04/13 0525  HGB 11.7* 8.8*  HCT 35.8* 26.7*    Assessment/Plan:  ASSESSMENT: Rachael Russo is a 38 y.o. [phone] s/p C/S with BTL with labor briefly  POD #1. presence of Moderate post-partum anemia, no treatment required. - has questions about baby in NICU, discussed with RN so that patient may discuss baby's care with provider with translator.  Plan for discharge tomorrow   LOS: 1 day   Trace Wirick ROCIO 12/04/2013, 5:39 PM

## 2013-12-04 NOTE — Addendum Note (Signed)
Addendum created 12/04/13 1610 by Algis Greenhouse, CRNA   Modules edited: Charges VN, Notes Section   Notes Section:  File: [id number]

## 2013-12-05 ENCOUNTER — Other Ambulatory Visit: Payer: Self-pay

## 2013-12-05 ENCOUNTER — Encounter (HOSPITAL_COMMUNITY): Payer: Self-pay | Admitting: *Deleted

## 2013-12-05 LAB — GLUCOSE, CAPILLARY
GLUCOSE-CAPILLARY: 139 mg/dL — AB (ref 70–99)
Glucose-Capillary: 162 mg/dL — ABNORMAL HIGH (ref 70–99)
Glucose-Capillary: 199 mg/dL — ABNORMAL HIGH (ref 70–99)
Glucose-Capillary: 142 mg/dL — ABNORMAL HIGH (ref 70–99)

## 2013-12-05 NOTE — Lactation Note (Signed)
This note was copied from the chart of Rachael Russo. LMyrtha TonkovichConsultation Note       Follow up consult with this mom of a term NICU baby, now 46 hours post partum. I reviewed pumping frequency and duration , and basic breast feeding teaching, with the assistance of Eda, spanish interpreter. Mom has had WIC in the post, but needs to apply with this baby. i faxed information to [facility] for mom. Dad is willing to laon a DEP on mom's discharge, if needed. The baby may come off IVF's today, and there is a slight possibility he could room in with mom tomorrow night.   Patient Name: Rachael Russo [id number] Date: 12/05/2013 Reason for consult: Follow-up assessment;NICU baby   Maternal Data    Feeding Feeding Type: Breast Milk Nipple Type: Slow - flow Length of feed: 30 min  LATCH Score/Interventions Latch: Repeated attempts needed to sustain latch, nipple held in mouth throughout feeding, stimulation needed to elicit sucking reflex. (mom very full, somewhat engorged, baby appears to have a short lingula frenulum) Intervention(s): Adjust position;Assist with latch;Breast massage;Breast compression  Audible Swallowing: A few with stimulation Intervention(s): Skin to skin;Hand expression;Alternate breast massage  Type of Nipple: Everted at rest and after stimulation (normally everted, but edmatous at this time, due to enogregement)  Comfort (Breast/Nipple): Engorged, cracked, bleeding, large blisters, severe discomfort Problem noted: Engorgment Intervention(s): Ice;Hand expression;Reverse pressure  Problem noted: Filling Interventions (Filling): Massage;Reverse pressure;Double electric pump  Hold (Positioning): Assistance needed to correctly position infant at breast and maintain latch. Intervention(s): Breastfeeding basics reviewed;Support Pillows;Position options;Skin to skin  LATCH Score: 5  Lactation Tools Discussed/Used Date initiated:: 12/04/13   Consult Status Consult Status:  Follow-up Date: 12/06/13 Follow-up type: In-patient    Alfred Levins 12/05/2013, 4:15 PM

## 2013-12-05 NOTE — Progress Notes (Signed)
Inpatient Diabetes Program Recommendations  AACE/ADA: New Consensus Statement on Inpatient Glycemic Control (2013)  Target Ranges:  Prepandial:   less than 140 mg/dL      Peak postprandial:   less than 180 mg/dL (1-2 hours)      Critically ill patients:  140 - 180 mg/dL   Results for Rachael Russo, Rachael Russo (MRN [id number]) as of 12/05/2013 07:07  Ref. Range 12/04/2013 06:44 12/04/2013 09:59 12/04/2013 15:13 12/04/2013 20:42 12/05/2013 05:42  Glucose-Capillary Latest Range: 70-99 mg/dL 308 (H) 657 (H) 846 (H) 209 (H) 139 (H)    Diabetes history: DM2 Outpatient Diabetes medications: NPH 22 units QAM, NPH 20 units HS, Novolin R 12 units QAM, Novolin R 10 units QHS Current orders for Inpatient glycemic control: NONE  Inpatient Diabetes Program Recommendations Oral Agents: Please consider prescribing oral diabetic medication at time of discharge for glycemic control.  Note: In reviewing the chart, noted patient has a history of Type 2 DM prior to pregnancy. Prior to pregnancy she was taking Glimepiride 2 mg QAM and Metformin 500 mg BID. CBGs ranged from 125-209 mg/dl on 9/62 and fasting glucose this morning is 139 mg/dl. Please consider prescribing oral diabetic medication at time of discharge and have patient follow up with PCP for diabetes management.    Thanks, Orlando Penner, RN, MSN, CCRN Diabetes Coordinator  Inpatient Diabetes Program [phone] (Team Pager) [phone] ([facility] office) [phone] [facility] office)

## 2013-12-05 NOTE — Lactation Note (Signed)
This note was copied from the chart of Rachael Russo. Lactation Consultation Note      Follow up consult with this mom and term baby, in NICU due to hypoglycemia. Mom is an experienced breast feeder. I assisted mom with latching her baby to her breast. She is very full, engorged. Reverse pressure done around mom's nipples, and baby was intermittently able to latch to both breasts, with good sucking. With massage, mom's milk was freely flowing. He was also fed a bottle of EBm after abreast feeding. Spanish interpreter present during the consult.  I advised mom to have her nurse get her ice when she got back to her room, and for her to pump every 2-3 hours today, for 15-30 minutes, until she stops dripping.  I spoke to 3rd floor nure to inform them of mom's need for ice packs, and increased pumping, with massage.   Patient Name: Rachael Russo ZOXWR'U Date: 12/05/2013 Reason for consult: Follow-up assessment;NICU baby   Maternal Data    Feeding Feeding Type: Breast Fed Length of feed: 30 min  LATCH Score/Interventions Latch: Repeated attempts needed to sustain latch, nipple held in mouth throughout feeding, stimulation needed to elicit sucking reflex. (mom very full, somewhat engorged, baby appears to have a short lingula frenulum) Intervention(s): Adjust position;Assist with latch;Breast massage;Breast compression  Audible Swallowing: A few with stimulation Intervention(s): Skin to skin;Hand expression;Alternate breast massage  Type of Nipple: Everted at rest and after stimulation (normally everted, but edmatous at this time, due to enogregement)  Comfort (Breast/Nipple): Engorged, cracked, bleeding, large blisters, severe discomfort Problem noted: Engorgment Intervention(s): Ice;Hand expression;Reverse pressure  Problem noted: Filling Interventions (Filling): Massage;Reverse pressure;Double electric pump  Hold (Positioning): Assistance needed to correctly position infant at breast  and maintain latch. Intervention(s): Breastfeeding basics reviewed;Support Pillows;Position options;Skin to skin  LATCH Score: 5  Lactation Tools Discussed/Used Date initiated:: 12/04/13   Consult Status Consult Status: Follow-up Date: 12/06/13 Follow-up type: In-patient    Alfred Levins 12/05/2013, 4:07 PM

## 2013-12-05 NOTE — Progress Notes (Signed)
Ur chart review completed.  

## 2013-12-05 NOTE — Progress Notes (Signed)
I  Assisted Norberta Keens,  With explanation of care plan.  Eda H Royal Interpreter.

## 2013-12-06 ENCOUNTER — Ambulatory Visit: Payer: Self-pay

## 2013-12-06 ENCOUNTER — Inpatient Hospital Stay (HOSPITAL_COMMUNITY): Admission: RE | Admit: 2013-12-06 | Payer: Medicaid Other | Source: Ambulatory Visit

## 2013-12-06 LAB — TYPE AND SCREEN
ABO/RH(D): O POS
Antibody Screen: NEGATIVE
Unit division: 0
Unit division: 0

## 2013-12-06 LAB — GLUCOSE, CAPILLARY
Glucose-Capillary: 97 mg/dL (ref 70–99)
Glucose-Capillary: 135 mg/dL — ABNORMAL HIGH (ref 70–99)

## 2013-12-06 MED ORDER — DOCUSATE SODIUM 100 MG PO CAPS: 100.0000 mg | ORAL_CAPSULE | Freq: Two times a day (BID) | ORAL | Status: DC | PRN

## 2013-12-06 MED ORDER — METFORMIN HCL 500 MG PO TABS: 500.0000 mg | ORAL_TABLET | Freq: Two times a day (BID) | ORAL | Status: DC

## 2013-12-06 MED ORDER — FERROUS SULFATE 325 (65 FE) MG PO TABS: 325.0000 mg | ORAL_TABLET | Freq: Two times a day (BID) | ORAL | Status: DC

## 2013-12-06 MED ORDER — IBUPROFEN 600 MG PO TABS: 600.0000 mg | ORAL_TABLET | Freq: Four times a day (QID) | ORAL | Status: DC

## 2013-12-06 MED ORDER — OXYCODONE-ACETAMINOPHEN 5-325 MG PO TABS: 1.0000 | ORAL_TABLET | ORAL | Status: DC | PRN

## 2013-12-06 NOTE — Progress Notes (Signed)
Pt discharged to home with husband.  Pt and husband walked to NICU where they will room-in with baby tonight in room 209.  Pt to get loaner DEBP from [facility] today.  Pt given a hand pump as husband had taken everything, including breast pump parts home before knowing that they would room-in tonight.   No equipment for home ordered at discharge.

## 2013-12-06 NOTE — Progress Notes (Signed)
I assisted Raynelle Fanning  RN,with discharge instructions. Eda H Royal Interpreter.

## 2013-12-06 NOTE — Discharge Summary (Signed)
Obstetric Discharge Summary Reason for Admission: onset of labor Prenatal Procedures: none Intrapartum Procedures: cesarean: low cervical, transverse and BTL Postpartum Procedures: none Complications-Operative and Postpartum: none Hemoglobin  Date Value Ref Range Status  12/04/2013 8.8* 12.0 - 15.0 g/dL Final     DELTA CHECK NOTED     REPEATED TO VERIFY     HCT  Date Value Ref Range Status  12/04/2013 26.7* 36.0 - 46.0 % Final    Physical Exam:  General: alert, cooperative and no distress Lochia: appropriate Uterine Fundus: firm, NT Incision: healing well, no significant drainage, no significant erythema DVT Evaluation: No evidence of DVT seen on physical exam. No cords or calf tenderness.  Discharge Diagnoses: Term Pregnancy-delivered  Discharge Information: Date: 12/06/2013 Activity: unrestricted and pelvic rest Diet: routine Medications: PNV, Ibuprofen, Colace, Iron and Percocet Condition: stable Instructions: refer to practice specific booklet Discharge to: home Follow-up Information   Follow up with [name]. (An appointment will be made for you to follow up in 4-6 weeks)    Specialty:  Obstetrics and Gynecology   Contact information:   [address] [name] [address] [phone]      Newborn Data: Live born female  Birth Weight: 9 lb 12.3 oz (4430 g) APGAR: 8, 9  Observation in NICU with possible discharge today or tomorrow.  Rachael Russo 12/06/2013, 7:29 AM

## 2013-12-06 NOTE — Lactation Note (Signed)
This note was copied from the chart of Rachael Brylin Stopper. Lactation Consultation Note   Follow up consult with this mom of a term NICU baby, now 29 days old. Mom is engorged, and has not been pumping, despite much encouragement to do so.  Today, her breast are very engorged, and she has been trying to breast feed her baby for an hour, and he is still hungry, taking up to 2 or more ounces of formula after breast feeding. I worked with mom for an hour, doing reverse massage of her breasts toward her armpits, relieving some of her engorgement. i intermittently had her pump. She was only able to express about 30 mls of transitional milk. I advised her to breast feed the baby on cue, then feed EBM, then formula. I advised her to pump after each time she breast feeds the baby tonight, while rooming in.   Patient Name: Rachael Russo ZOXWR'U Date: 12/06/2013     Maternal Data    Feeding Feeding Type: Breast Milk with Formula added Nipple Type: Slow - flow  LATCH Score/Interventions                      Lactation Tools Discussed/Used     Consult Status      Rachael Russo 12/06/2013, 5:28 PM

## 2013-12-06 NOTE — Discharge Instructions (Signed)
Parto por cesrea - Cuidados posteriores  (Cesarean Delivery, Care After) Siga estas instrucciones durante las prximas semanas. Estas indicaciones le proporcionan informacin general acerca de cmo deber cuidarse despus del procedimiento. El mdico tambin podr darle instrucciones ms especficas. El tratamiento se ha planificado de acuerdo a las prcticas mdicas actuales, pero a veces se producen problemas. Comunquese con el mdico si tiene algn problema o tiene dudas cuando vuelva a su casa.  INSTRUCCIONES PARA EL CUIDADO EN EL HOGAR  Tome slo medicamentos de venta libre o recetados, segn las indicaciones del mdico.  No beba alcohol, especialmente si est amamantando o toma analgsicos.  Nomastique tabaco ni fume.  Contine con un adecuado cuidado perineal. El buen cuidado perineal incluye:  Higienizarse de adelante hacia atrs.  Mantener la zona perineal limpia.  Controlar diariamente el corte (incisin) y observar si aumenta el enrojecimiento, si supura, se hincha o se separa la piel.  Limpie la incisin suavemente con jabn y agua todos los das, y luego squela dando golpecitos. Si el mdico la autoriza, deje la incisin al descubierto. Use un apsito (vendaje) si drena lquido o la incisin parece irritada. Si las pequeas tiras [facility] que cruzan la incisin no se caen dentro de los 7 das, retrelas suavemente.  Abrace una almohada al toser o estornudar hasta que la incisin se cure. Esto ayuda a Best boy.  No conduzca vehculos ni opere maquinarias hasta que el mdico la autorice.  Dchese, lvese el cabello y tome baos de inmersin segn las indicaciones de su mdico.  Utilice un sostn que le ajuste bien y que brinde buen soporte a sus Glass blower/designer.  Limite el uso de bombachas de sostn o medias panty.  Beba suficiente lquido para Consulting civil engineer orina clara o de color amarillo plido.  Consuma todos los das alimentos ricos en fibra como cereales y panes  [place], arroz, frijoles y frutas frescas y verduras. Estos alimentos pueden ayudarla a prevenir o Cytogeneticist.  Reanude las actividades como subir escaleras, conducir automviles, levantar objetos pesados, hacer ejercicios o viajar cuando le indique su mdico.  Hable con su mdico acerca de reanudar la actividad sexual. Volver a la actividad sexual depende del riesgo de infeccin, la velocidad de la curacin y la comodidad y su deseo de Financial controller.  Trate de que alguien la ayude con las actividades del hogar y con el recin nacido al menos durante algunos das despus de salir del hospital.  Descanse todo lo que pueda. Trate de descansar o tomar una siesta mientras el beb est durmiendo.  Aumente sus actividades gradualmente.  Cumpla con todos los controles programados para despus del [place]. Es muy importante asistir a todas las visitas de Nurse, adult. En estas visitas, su mdico va a controlarla para asegurarse de que est sanando fsica y emocionalmente. SOLICITE ATENCIN MDICA SI:   Elimina cogulos grandes por la vagina. Guarde algunos cogulos para mostrarle al mdico.  Tiene una secrecin con feo olor que proviene de la vagina.  Tiene dificultad para orinar.  Orina con frecuencia.  Siente dolor al [employer].  Nota un cambio en sus movimientos intestinales.  Aumenta el enrojecimiento, el dolor o la hinchazn en la zona de la incisin.  Observa que supura pus en la incisin.  La incisin se abre.  Sus [employer] duelen, estn duras o enrojecidas.  Sufre un dolor intenso de [place].  Tiene visin borrosa o ve manchas.  Se siente triste o deprimida.  Tiene pensamientos acerca de lastimarse o daar al  recién nacido. °· Tiene preguntas acerca de su cuidado, la atención del recién nacido o acerca de los medicamentos. °· Se siente mareada o sufre un desmayo. °· Tiene una erupción. °· Siente dolor u observa enrojecimiento o hinchazón en el sitio en que  estaba la vía intravenosa (IV). °· Tiene náuseas o vómitos. °· Usted dejó de amamantar al bebé y no ha tenido su período menstrual dentro de las 12 semanas siguientes. °· No amamanta al bebé y no tuvo su período menstrual en las últimas 12 semanas. °· Tiene fiebre. °SOLICITE ATENCIÓN MÉDICA DE INMEDIATO SI:  °· Siente dolor persistente. °· Siente dolor en el pecho. °· Le falta el aire. °· Se desmaya. °· Siente dolor en la pierna. °· Siente dolor en el estómago. °· El sangrado vaginal satura dos o más apósitos en 1 hora. °ASEGÚRESE DE QUE:  °· Comprende estas instrucciones. °· Controlará su enfermedad. °· Recibirá ayuda de inmediato si no mejora o si empeora. °Document Released: 03/31/2005 Document Revised: 08/15/2013 °ExitCare® Patient Information ©2015 ExitCare, LLC. This information is not intended to replace advice given to you by your health care provider. Make sure you discuss any questions you have with your health care provider. ° °

## 2013-12-09 ENCOUNTER — Encounter: Payer: Self-pay | Admitting: General Practice

## 2014-01-11 ENCOUNTER — Ambulatory Visit: Payer: Self-pay | Admitting: Obstetrics & Gynecology

## 2014-01-13 ENCOUNTER — Ambulatory Visit (INDEPENDENT_AMBULATORY_CARE_PROVIDER_SITE_OTHER): Payer: Medicaid Other | Admitting: Family Medicine

## 2014-01-13 ENCOUNTER — Encounter: Payer: Self-pay | Admitting: Family Medicine

## 2014-01-13 ENCOUNTER — Ambulatory Visit: Payer: Self-pay | Admitting: Obstetrics & Gynecology

## 2014-01-13 NOTE — Patient Instructions (Signed)
Postpartum Depression and Baby Blues The postpartum period begins right after the birth of a baby. During this time, there is often a great amount of joy and excitement. It is also a time of many changes in the life of the parents. Regardless of how many times a mother gives birth, each child brings new challenges and dynamics to the family. It is not unusual to have feelings of excitement along with confusing shifts in moods, emotions, and thoughts. All mothers are at risk of developing postpartum depression or the "baby blues." These mood changes can occur right after giving birth, or they may occur many months after giving birth. The baby blues or postpartum depression can be mild or severe. Additionally, postpartum depression can go away rather quickly, or it can be a long-term condition.  CAUSES Raised hormone levels and the rapid drop in those levels are thought to be a main cause of postpartum depression and the baby blues. A number of hormones change during and after pregnancy. Estrogen and progesterone usually decrease right after the delivery of your baby. The levels of thyroid hormone and various cortisol steroids also rapidly drop. Other factors that play a role in these mood changes include major life events and genetics.  RISK FACTORS If you have any of the following risks for the baby blues or postpartum depression, know what symptoms to watch out for during the postpartum period. Risk factors that may increase the likelihood of getting the baby blues or postpartum depression include:  Having a personal or family history of depression.   Having depression while being pregnant.   Having premenstrual mood issues or mood issues related to oral contraceptives.  Having a lot of life stress.   Having marital conflict.   Lacking a social support network.   Having a baby with special needs.   Having health problems, such as diabetes.  SIGNS AND SYMPTOMS Symptoms of baby blues  include:  Brief changes in mood, such as going from extreme happiness to sadness.  Decreased concentration.   Difficulty sleeping.   Crying spells, tearfulness.   Irritability.   Anxiety.  Symptoms of postpartum depression typically begin within the first month after giving birth. These symptoms include:  Difficulty sleeping or excessive sleepiness.   Marked weight loss.   Agitation.   Feelings of worthlessness.   Lack of interest in activity or food.  Postpartum psychosis is a very serious condition and can be dangerous. Fortunately, it is rare. Displaying any of the following symptoms is cause for immediate medical attention. Symptoms of postpartum psychosis include:   Hallucinations and delusions.   Bizarre or disorganized behavior.   Confusion or disorientation.  DIAGNOSIS  A diagnosis is made by an evaluation of your symptoms. There are no medical or lab tests that lead to a diagnosis, but there are various questionnaires that a health care provider may use to identify those with the baby blues, postpartum depression, or psychosis. Often, a screening tool called the Edinburgh Postnatal Depression Scale is used to diagnose depression in the postpartum period.  TREATMENT The baby blues usually goes away on its own in 1-2 weeks. Social support is often all that is needed. You will be encouraged to get adequate sleep and rest. Occasionally, you may be given medicines to help you sleep.  Postpartum depression requires treatment because it can last several months or longer if it is not treated. Treatment may include individual or group therapy, medicine, or both to address any social, physiological, and psychological   factors that may play a role in the depression. Regular exercise, a healthy diet, rest, and social support may also be strongly recommended.  Postpartum psychosis is more serious and needs treatment right away. Hospitalization is often needed. HOME CARE  INSTRUCTIONS  Get as much rest as you can. Nap when the baby sleeps.   Exercise regularly. Some women find yoga and walking to be beneficial.   Eat a balanced and nourishing diet.   Do little things that you enjoy. Have a cup of tea, take a bubble bath, read your favorite magazine, or listen to your favorite music.  Avoid alcohol.   Ask for help with household chores, cooking, grocery shopping, or running errands as needed. Do not try to do everything.   Talk to people close to you about how you are feeling. Get support from your partner, family members, friends, or other new moms.  Try to stay positive in how you think. Think about the things you are grateful for.   Do not spend a lot of time alone.   Only take over-the-counter or prescription medicine as directed by your health care provider.  Keep all your postpartum appointments.   Let your health care provider know if you have any concerns.  SEEK MEDICAL CARE IF: You are having a reaction to or problems with your medicine. SEEK IMMEDIATE MEDICAL CARE IF:  You have suicidal feelings.   You think you may harm the baby or someone else. MAKE SURE YOU:  Understand these instructions.  Will watch your condition.  Will get help right away if you are not doing well or get worse. Document Released: 01/03/2004 Document Revised: 04/05/2013 Document Reviewed: 01/10/2013 ExitCare Patient Information 2015 ExitCare, LLC. This information is not intended to replace advice given to you by your health care provider. Make sure you discuss any questions you have with your health care provider.  

## 2014-01-13 NOTE — Progress Notes (Signed)
  Subjective:     Rachael Russo is a 38 y.o. female who presents for a postpartum visit. She is 6 weeks postpartum following a low cervical transverse Cesarean section. I have fully reviewed the prenatal and intrapartum course. The delivery was at 39 gestational weeks. Outcome: primary cesarean section, low transverse incision. Anesthesia: spinal. Postpartum course has been normal. Baby's course has been normal. Baby is feeding by breast. Bleeding no bleeding. Bowel function is normal. Bladder function is normal. Patient is not sexually active. Contraception method is tubal ligation. Postpartum depression screening: negative.  The following portions of the patient's history were reviewed and updated as appropriate: allergies, current medications, past family history, past medical history, past social history, past surgical history and problem list.  Review of Systems Pertinent items are noted in HPI.   Objective:    BP 132/78  Pulse 93  Wt 146 lb 4.8 oz (66.361 kg)  Breastfeeding? Yes  General:  alert, cooperative and no distress  Lungs: clear to auscultation bilaterally  Heart:  regular rate and rhythm, S1, S2 normal, no murmur, click, rub or gallop  Abdomen: soft, non-tender; bowel sounds normal; no masses,  no organomegaly.  Incision clean, dry, intact.        Assessment:     6 week postpartum exam. Pap smear not done at today's visit.   Plan:    1. Contraception: tubal ligation 2. Information for [facility] program given.  Will need PAP in 3 years. 3. Follow up in: 1 year or as needed.

## 2014-02-13 ENCOUNTER — Encounter: Payer: Self-pay | Admitting: Family Medicine

## 2014-02-17 ENCOUNTER — Other Ambulatory Visit: Payer: Self-pay | Admitting: Obstetrics and Gynecology

## 2014-03-16 ENCOUNTER — Encounter: Payer: Self-pay | Admitting: Obstetrics & Gynecology

## 2014-05-25 ENCOUNTER — Other Ambulatory Visit: Payer: Self-pay | Admitting: Obstetrics & Gynecology

## 2014-05-25 ENCOUNTER — Other Ambulatory Visit: Payer: Self-pay

## 2014-05-25 ENCOUNTER — Telehealth: Payer: Self-pay | Admitting: *Deleted

## 2014-05-25 DIAGNOSIS — R7989 Other specified abnormal findings of blood chemistry: Secondary | ICD-10-CM

## 2014-05-25 NOTE — Telephone Encounter (Signed)
Contacted patient with Rachael Russo, pt to come in today for TSH labs.

## 2014-05-25 NOTE — Telephone Encounter (Signed)
Attempted to contact patient with Spanish Interpreter [name], spouse answered the phone, states patient is not at home and he has her cell phone. Requested for him to give her a message to call the clinic.

## 2014-05-25 NOTE — Telephone Encounter (Signed)
-----   Message from Lesly DukesKelly H Leggett, MD sent at 05/25/2014  6:00 AM EST ----- Can you please call patient and see if she has primary care MD?  Her TSH was low in pregnancy.  Follow up labs were ordered but never collected.  If she doesn't have PCP can you have her come in for a TSH only.

## 2014-05-26 LAB — TSH

## 2014-05-30 ENCOUNTER — Other Ambulatory Visit: Payer: Self-pay | Admitting: Family Medicine

## 2014-05-30 LAB — T4, FREE: Free T4: 3.52 ng/dL — ABNORMAL HIGH (ref 0.80–1.80)

## 2014-05-30 LAB — T3, FREE: T3 FREE: 16.2 pg/mL — AB (ref 2.3–4.2)

## 2014-05-31 ENCOUNTER — Telehealth: Payer: Self-pay

## 2014-05-31 DIAGNOSIS — E0590 Thyrotoxicosis, unspecified without thyrotoxic crisis or storm: Secondary | ICD-10-CM

## 2014-05-31 NOTE — Telephone Encounter (Signed)
Informed by staff nurse that [facility] is no longer accepting referrals-- patient needs to call clinic at 0900 every morning to attempt to get one of 10 new appointments for that day. Called family medicine and was informed by front office staff member Berton MountHarriet that there is a 3-6 month waiting period for new patients. Called patient and informed her that she will need to call [facility] to obtain appointment with directions listed above. Patient verbalized understanding and stated she would. No questions or concerns.

## 2014-05-31 NOTE — Telephone Encounter (Signed)
-----   Message from Tereso NewcomerUgonna A Anyanwu, MD sent at 05/30/2014  1:16 PM EST ----- Needs referral to PCP for treatment of hyperthyroidism; needs appointment ASAP.  Will also forward to Dr. Adrian BlackwaterStinson to see if he has any recommendations in the meantime.  No longer pregnant. Spanish speaking.

## 2014-05-31 NOTE — Telephone Encounter (Signed)
Referral to community health and wellness for establishment of PCP with appointment for hyperthyroidism ASAP made through [facility]. Called [facility] referral coordinator Rachael Russo and left message informing her of referral and the need for appointment ASAP, asked that she call clinic when appointment is made or if it is not possible. Attempted to contact patient with interpreter Rachael Russo to inform her of results and referral. No answer. Left message stating we are calling with results, please call clinic. Called mobile number and was able to inform patient of results and referral. Advised she call clinic if she has not heard from [facility] by next Wednesday with an appointment. Patient verbalized understanding and gratitude. Asked how it would be controlled. Informed her likely oral medication. Patient verbalized understanding and gratitude. No further questions or concerns.

## 2014-06-02 LAB — THYROTROPIN RECEPTOR AUTOABS: Thyrotropin Receptor Ab: 35 % — ABNORMAL HIGH (ref <=16.0)

## 2014-06-12 ENCOUNTER — Ambulatory Visit: Payer: Self-pay | Attending: Family Medicine | Admitting: Family Medicine

## 2014-06-12 ENCOUNTER — Encounter: Payer: Self-pay | Admitting: Family Medicine

## 2014-06-12 VITALS — BP 132/83 | HR 76 | Temp 98.0°F | Resp 16 | Wt 131.6 lb

## 2014-06-12 DIAGNOSIS — E875 Hyperkalemia: Secondary | ICD-10-CM

## 2014-06-12 DIAGNOSIS — O24313 Unspecified pre-existing diabetes mellitus in pregnancy, third trimester: Secondary | ICD-10-CM

## 2014-06-12 DIAGNOSIS — E0590 Thyrotoxicosis, unspecified without thyrotoxic crisis or storm: Secondary | ICD-10-CM | POA: Insufficient documentation

## 2014-06-12 DIAGNOSIS — Z833 Family history of diabetes mellitus: Secondary | ICD-10-CM | POA: Insufficient documentation

## 2014-06-12 DIAGNOSIS — R748 Abnormal levels of other serum enzymes: Secondary | ICD-10-CM

## 2014-06-12 DIAGNOSIS — E119 Type 2 diabetes mellitus without complications: Secondary | ICD-10-CM | POA: Insufficient documentation

## 2014-06-12 DIAGNOSIS — E0500 Thyrotoxicosis with diffuse goiter without thyrotoxic crisis or storm: Secondary | ICD-10-CM

## 2014-06-12 DIAGNOSIS — IMO0002 Reserved for concepts with insufficient information to code with codable children: Secondary | ICD-10-CM

## 2014-06-12 DIAGNOSIS — E139 Other specified diabetes mellitus without complications: Secondary | ICD-10-CM

## 2014-06-12 DIAGNOSIS — E1165 Type 2 diabetes mellitus with hyperglycemia: Secondary | ICD-10-CM | POA: Insufficient documentation

## 2014-06-12 DIAGNOSIS — Z9851 Tubal ligation status: Secondary | ICD-10-CM

## 2014-06-12 DIAGNOSIS — O24913 Unspecified diabetes mellitus in pregnancy, third trimester: Secondary | ICD-10-CM

## 2014-06-12 LAB — POCT URINALYSIS DIPSTICK
Bilirubin, UA: NEGATIVE
Blood, UA: NEGATIVE
Glucose, UA: 500
KETONES UA: NEGATIVE
Leukocytes, UA: NEGATIVE
NITRITE UA: NEGATIVE
PH UA: 5
Protein, UA: NEGATIVE
Spec Grav, UA: 1.015
Urobilinogen, UA: 0.2

## 2014-06-12 LAB — CBC
HCT: 38.2 % (ref 36.0–46.0)
Hemoglobin: 13.4 g/dL (ref 12.0–15.0)
MCH: 29.6 pg (ref 26.0–34.0)
MCHC: 35.1 g/dL (ref 30.0–36.0)
MCV: 84.3 fL (ref 78.0–100.0)
MPV: 12.9 fL — ABNORMAL HIGH (ref 8.6–12.4)
PLATELETS: 277 10*3/uL (ref 150–400)
RBC: 4.53 MIL/uL (ref 3.87–5.11)
RDW: 12.2 % (ref 11.5–15.5)
WBC: 9.2 10*3/uL (ref 4.0–10.5)

## 2014-06-12 LAB — LIPID PANEL
Cholesterol: 151 mg/dL (ref 0–200)
HDL: 59 mg/dL (ref >=46)
LDL CALC: 78 mg/dL (ref 0–99)
Total CHOL/HDL Ratio: 2.6 Ratio
Triglycerides: 72 mg/dL (ref <150)
VLDL: 14 mg/dL (ref 0–40)

## 2014-06-12 LAB — COMPLETE METABOLIC PANEL WITH GFR
ALT: 36 U/L — AB (ref 0–35)
AST: 21 U/L (ref 0–37)
Albumin: 4.4 g/dL (ref 3.5–5.2)
Alkaline Phosphatase: 151 U/L — ABNORMAL HIGH (ref 39–117)
BILIRUBIN TOTAL: 0.3 mg/dL (ref 0.2–1.2)
BUN: 14 mg/dL (ref 6–23)
CALCIUM: 11.2 mg/dL — AB (ref 8.4–10.5)
CO2: 24 meq/L (ref 19–32)
CREATININE: 0.45 mg/dL — AB (ref 0.50–1.10)
Chloride: 106 mEq/L (ref 96–112)
GFR, Est Non African American: >89 mL/min
Glucose, Bld: 235 mg/dL — ABNORMAL HIGH (ref 70–99)
POTASSIUM: 5.4 meq/L — AB (ref 3.5–5.3)
Sodium: 141 mEq/L (ref 135–145)
TOTAL PROTEIN: 7 g/dL (ref 6.0–8.3)

## 2014-06-12 LAB — GLUCOSE, POCT (MANUAL RESULT ENTRY): POC Glucose: 336 mg/dl — AB (ref 70–99)

## 2014-06-12 LAB — POCT GLYCOSYLATED HEMOGLOBIN (HGB A1C): HEMOGLOBIN A1C: 9.5

## 2014-06-12 LAB — POCT URINE PREGNANCY: Preg Test, Ur: NEGATIVE

## 2014-06-12 MED ORDER — TRUEPLUS LANCETS 28G MISC: 1.0000 | Freq: Two times a day (BID) | Status: DC

## 2014-06-12 MED ORDER — ATENOLOL 25 MG PO TABS: 25.0000 mg | ORAL_TABLET | Freq: Every day | ORAL | Status: DC

## 2014-06-12 MED ORDER — TRUERESULT BLOOD GLUCOSE W/DEVICE KIT: 1.0000 | PACK | Freq: Two times a day (BID) | Status: DC

## 2014-06-12 MED ORDER — METFORMIN HCL ER 500 MG PO TB24: 1000.0000 mg | ORAL_TABLET | Freq: Every day | ORAL | Status: DC

## 2014-06-12 MED ORDER — PRENATAL MULTIVITAMIN CH: 1.0000 | ORAL_TABLET | Freq: Every day | ORAL | Status: AC

## 2014-06-12 MED ORDER — GLUCOSE BLOOD VI STRP: 1.0000 | ORAL_STRIP | Freq: Three times a day (TID) | Status: DC

## 2014-06-12 MED ORDER — METHIMAZOLE 10 MG PO TABS
10.0000 mg | ORAL_TABLET | Freq: Three times a day (TID) | ORAL | Status: DC
Start: 2014-06-12 — End: 2014-09-15

## 2014-06-12 MED ORDER — INSULIN ASPART 100 UNIT/ML ~~LOC~~ SOLN
10.0000 [IU] | Freq: Once | SUBCUTANEOUS | Status: AC
  Administered 2014-06-12: 10 [IU] via SUBCUTANEOUS

## 2014-06-12 MED ORDER — INSULIN NPH ISOPHANE & REGULAR (70-30) 100 UNIT/ML ~~LOC~~ SUSP: 10.0000 [IU] | Freq: Two times a day (BID) | SUBCUTANEOUS | Status: DC

## 2014-06-12 NOTE — Progress Notes (Signed)
   Subjective:    Patient ID: Rachael Russo, female    DOB: [birth year], 39 y.o.   MRN: [id number] CC: establish care, thyroid problem, DM2 HPI 39 yo Hispanic female Spanish interpreter present during visit:  1. DM2: dx with gestational DM during last pregnancy. Took metformin. 6 months post partum. Has excessive thirst. Has ache in both legs. Has frequent soft stools. Denies tingling or numbness in extremities.  2. Hyperthyroidism: informed 3 weeks ago. No fam hx of dz. Admits to frequent stools. Denies CP, palpitations, tremor, anxiety, insomnia. Admits to quick weight loss.   Soc Hx: non smoker  Med Hx: gestational diabetes in 2015, last pregnancy  Fam Hx: DM2 in mother, no family hx of thyroid disease  Review of Systems As per HPI     Objective:   Physical Exam BP 132/83 mmHg  Pulse 76  Temp(Src) 98 F (36.7 C)  Resp 16  Wt 131 lb 9.6 oz (59.693 kg)  SpO2 99%  Breastfeeding? Yes General appearance: alert, cooperative and no distress Eyes: conjunctivae/corneas clear. PERRL, EOM's intact.  Throat: lips, mucosa, and tongue normal; teeth and gums normal Lungs: clear to auscultation bilaterally Heart: regular rate and rhythm, S1, S2 normal, no murmur, click, rub or gallop Abdomen: soft, non-tender; bowel sounds normal; no masses,  no organomegaly Extremities: extremities normal, atraumatic, no cyanosis or edema Neurologic: Grossly normal, fine resting tremor   Lab Results  Component Value Date   HGBA1C 9.50 06/12/2014   CBG 330 UA: neg for glucose and ketones  10 U novolog given Repeat CBG 300      Assessment & Plan:  Greater than 30 minute was spent face to face with patient explaining her medical diagnoses, answering her questions and explaining the plan of care.

## 2014-06-12 NOTE — Assessment & Plan Note (Signed)
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

## 2014-06-12 NOTE — Progress Notes (Signed)
Patient here with in house interpreter-Rachael Russo Patient had a recent pregnancy associated with gestational diabetes Patient stopped taking the metformin after the delivery Currently on no medications Presents in office with elevated blood sugar Patient is having frequent urination and increased thirst

## 2014-06-12 NOTE — Patient Instructions (Addendum)
Mrs. Rachael Russo,  Thank you for coming in today. It was a pleasure meeting you. I look forward to being your primary doctor.    1. Grave's diease hyperthyroidism Atenolol 25 mg once daily  Methimazole 10 mg three times daily every   2. Diabetes  Goal A1c < 7 Metformin 500 mg XR for one week then 1000 mg XR novolin 70/30 10 U twice daily with breakfast and supper  Check and write down blood sugars (3 times a day check) Check blood sugar prior to giving yourself insulin Check fasting sugar (nothing to eat or drink but water for 8 hrs at least) Check sugar 2 hrs after a meal at least once a day.   Diabetes  Goal fasting 100  Goal after eating < 160 Beware of hypoglycemia (low blood sugar) which is blood sugar < 70 with or without symptoms  Beware of hypoglycemia which is blood sugar less than 70 with or without symptoms.  The common symptoms of hypoglycemia are: sweating, pale or dusty skin, excessive fatigue, nausea, jitteriness. If you experience these symptoms please check your blood sugar.  My blood sugar is low  (less than 70). What should I do?  If low 60- 70, with or without symptoms. Do not take insulin or oral medication,  eat or drink carbohydrates right away (juice, sweets, breads, fruit). Recheck blood sugar in 2 hrs. If still low call your doctor. If normal take medication.   If 60-40 without symptoms.  Same as above and call your doctor.   If 60-40 with symptoms. Same as above. If symptoms resolve within 30 minutes of eating or drinking carbohydrates call your doctor. If symptoms persist call 911.   If less than 40 with or without symptoms. Same as above. Do not wait 30 minutes, instead call 911.     Please apply for [place] discount and orange card, you can also inquire if any of your medications are on the PASS (medications assistance) list.   F/u with nurse in 2 weeks for blood sugar check   F/u with me in 4 weeks for thyroid check  Dr. Armen Pickup    Hipertiroidismo (Hyperthyroidism) La tiroides es una glndula grande ubicada en la parte anterior e inferior del cuello. La tiroides interviene [employer] control del metabolismo. El metabolismo es el modo en que el organismo utiliza los alimentos. El control del metabolismo se realiza a travs de una hormona denominada tiroxina. Cuando la tiroides es hiperactiva, produce demasiada hormona. Cuando esto ocurre pueden surgir los siguientes problemas:   Nerviosismo  Intolerancia al calor  Prdida de peso (a pesar del aumento de la ingesta de comida)  Diarrea  Cambios en la textura del cabello o de la piel  Palpitaciones (falta de algunos latidos cardacos o latidos extra)  Taquicardia (frecuencia cardaca acelerada)  Falta de menstruacin (amenorrea)  Temblor en las manos CAUSAS  Enfermedad de Graves (el sistema inmune ataca a la glndula tiroides). sta es la causa ms frecuente.  Inflamacin de la glndula tiroides.  Tumor (generalmente benigno) de la glndula tiroides o Scientist, water quality.  Uso excesivo de medicamentos para la tiroides (tanto prescriptos como 'naturales')  Ingesta excesiva de iodo. DIAGNSTICO Para confirmar el hipertiroidismo, el profesional que lo asiste le solicitar anlisis de sangre y estudios con [place]. Algunas veces los signos estn ocultos. Puede ser [employer] profesional controle la enfermedad con exmenes de [place], ya sea antes o despus del diagnstico y [place]. TRATAMIENTO Tratamiento a Web designer  varios tratamientos para controlar los sntomas. Los medicamentos beta bloqueantes podrn proporcionar cierto alivio. Los medicamentos que disminuyen la produccin de hormonas proporcionarn a Neurosurgeonmuchas personas un alivio temporal Estas medidas en general no ofrecen Architectuna mejora permanente. Tratamiento definitivo  Se dispone de varios tratamientos que el profesional que lo asiste podr Agricultural engineercomentar con usted y que tratarn el problema  de [place] permanente. Estos tratamientos varan desde la ciruga (extirpacin de la tiroides) o el uso de yodo radiactivo (que destruye la tiroides por radiacin), hasta el uso de medicamentos antitiroideos (que interfieren en la sntesis de la hormona). Los dos primeros tratamientos son permanentes y Soil scientistgeneralmente exitosos. Habitualmente requieren el suministro de hormona de por vida. Esto es debido a que es imposible retirar o destruir la cantidad Secondary school teacherexacta de tiroides que se necesita para que la persona quede eutiroidea (normal). INSTRUCCIONES PARA EL CUIDADO DOMICILIARIO Consulte con el profesional que lo asiste si el problema por el que lo trata empeora. Algunos ejemplos seran los trastornos ya mencionados. SOLICITE ATENCIN MDICA SI: El trastorno general empeora. EST SEGURO QUE:   Comprende las instrucciones para el alta mdica.  Controlar su enfermedad.  Solicitar atencin mdica de inmediato segn las indicaciones. Document Released: 03/31/2005 Document Revised: 06/23/2011 [facility] Patient Information 2015 [place], [place]. This information is not intended to replace advice given to you by your health care provider. Make sure you discuss any questions you have with your health care provider.

## 2014-06-12 NOTE — Assessment & Plan Note (Signed)
A:  Diabetes uncontrolled. Untreated P: Goal A1c < 7 Metformin 500 mg XR for one week then 1000 mg XR novolin 70/30 10 U twice daily with breakfast and supper  Check and write down blood sugars (3 times a day check) Check blood sugar prior to giving yourself insulin Check fasting sugar (nothing to eat or drink but water for 8 hrs at least) Check sugar 2 hrs after a meal at least once a day.

## 2014-06-13 LAB — MICROALBUMIN / CREATININE URINE RATIO
Creatinine, Urine: 43.5 mg/dL
Microalb Creat Ratio: 52.9 mg/g — ABNORMAL HIGH (ref 0.0–30.0)
Microalb, Ur: 2.3 mg/dL — ABNORMAL HIGH (ref <2.0)

## 2014-06-13 MED ORDER — LISINOPRIL 10 MG PO TABS: 10.0000 mg | ORAL_TABLET | Freq: Every day | ORAL | Status: DC

## 2014-06-13 NOTE — Addendum Note (Signed)
Addended by: Dessa PhiFUNCHES, Mashell Sieben on: 06/13/2014 08:46 AM   Modules accepted: Orders

## 2014-06-21 ENCOUNTER — Telehealth: Payer: Self-pay | Admitting: *Deleted

## 2014-06-21 DIAGNOSIS — R748 Abnormal levels of other serum enzymes: Secondary | ICD-10-CM | POA: Insufficient documentation

## 2014-06-21 DIAGNOSIS — E875 Hyperkalemia: Secondary | ICD-10-CM | POA: Insufficient documentation

## 2014-06-21 LAB — ANTI-ISLET CELL ANTIBODY: Pancreatic Islet Cell Antibody: <5 JDF Units (ref <5)

## 2014-06-21 NOTE — Addendum Note (Signed)
Addended by: Dessa PhiFUNCHES, Meline Russaw on: 06/21/2014 09:16 AM   Modules accepted: Orders

## 2014-06-21 NOTE — Telephone Encounter (Signed)
-----   Message from Dessa PhiJosalyn Funches, MD sent at 06/21/2014  9:09 AM EST ----- Negative pancreatic islet cell antibodies, so patient does not have type 1 DM, she has type 2.  Normal cholesterol panel.  CMP with slightly elevated potassium normal Creatinine. Elevated blood sugar and slightly elevate calcium and alkphos. Normal CBC (no anemia)   Plan:  Repeat BMP and stop lisinopril for elevated K+.  Vit D, phos, PTH for elevated calcium

## 2014-06-21 NOTE — Telephone Encounter (Signed)
-----   Message from Dessa PhiJosalyn Funches, MD sent at 06/21/2014  9:16 AM EST ----- Please come fasting for labs.

## 2014-06-22 NOTE — Telephone Encounter (Signed)
Pt aware of lab results  Lab appointment schedule (information given in spanish)

## 2014-06-30 ENCOUNTER — Other Ambulatory Visit: Payer: Self-pay

## 2014-06-30 ENCOUNTER — Ambulatory Visit: Payer: Self-pay | Attending: Family Medicine | Admitting: *Deleted

## 2014-06-30 VITALS — BP 127/72 | HR 99 | Temp 98.4°F | Resp 16

## 2014-06-30 DIAGNOSIS — E1165 Type 2 diabetes mellitus with hyperglycemia: Secondary | ICD-10-CM | POA: Insufficient documentation

## 2014-06-30 DIAGNOSIS — IMO0002 Reserved for concepts with insufficient information to code with codable children: Secondary | ICD-10-CM

## 2014-06-30 LAB — COMPLETE METABOLIC PANEL WITH GFR
ALT: 45 U/L — AB (ref 0–35)
AST: 23 U/L (ref 0–37)
Albumin: 4.6 g/dL (ref 3.5–5.2)
Alkaline Phosphatase: 133 U/L — ABNORMAL HIGH (ref 39–117)
BILIRUBIN TOTAL: 0.4 mg/dL (ref 0.2–1.2)
BUN: 11 mg/dL (ref 6–23)
CHLORIDE: 104 meq/L (ref 96–112)
CO2: 24 mEq/L (ref 19–32)
Calcium: 10.9 mg/dL — ABNORMAL HIGH (ref 8.4–10.5)
Creat: 0.42 mg/dL — ABNORMAL LOW (ref 0.50–1.10)
GFR, Est Non African American: >89 mL/min
GLUCOSE: 114 mg/dL — AB (ref 70–99)
Potassium: 5.5 mEq/L — ABNORMAL HIGH (ref 3.5–5.3)
Sodium: 141 mEq/L (ref 135–145)
TOTAL PROTEIN: 7.9 g/dL (ref 6.0–8.3)

## 2014-06-30 LAB — MAGNESIUM: Magnesium: 1.7 mg/dL (ref 1.5–2.5)

## 2014-06-30 LAB — PHOSPHORUS: Phosphorus: 4.3 mg/dL (ref 2.3–4.6)

## 2014-06-30 NOTE — Progress Notes (Signed)
Spoke with patient via [employer] Interpreter, [place], [place] [phone] and in-house interpreter, Jomarie LongsBelen Patient presents for fasting labs and CBG and record review Med list reviewed; patient reports taking all meds as directed Patient's AM fasting blood sugars ranging 84-129 Patient's before lunch blood sugars ranging 106-197 Patient's before dinner blood sugars ranging 84-199 Patient's 2 hour after breakfast blood sugars ranging 133-260 Patient's 2 hour after lunch blood sugars ranging 104-225 Patient's 2 hour after dinner blood sugars ranging 126-272 Patient states she no longer has leg aches or excessive thirst.  Has increased water intake after learning potassium level was slightly elevated  Patient's fasting blood sugar this AM= 97  BP 127/72 P 99 R 16 T  98.4 oral SpO2 98%  Per PCP: No changes to meds at this time Keep up the good work!  Patient will continue checking blood sugar before breakfast and before evening meal and 2 hours after lunch  Patient advised to call for med refills at least 7 days before running out so as not to go without.  Patient aware that she is to f/u with PCP 3 months from last visit. Due 09/10/2014

## 2014-07-01 LAB — VITAMIN D 25 HYDROXY (VIT D DEFICIENCY, FRACTURES): Vit D, 25-Hydroxy: 33 ng/mL (ref 30–100)

## 2014-07-03 LAB — PTH, INTACT AND CALCIUM
Calcium: 10.9 mg/dL — ABNORMAL HIGH (ref 8.4–10.5)
PTH: 19 pg/mL (ref 14–64)

## 2014-07-27 ENCOUNTER — Ambulatory Visit: Payer: Self-pay | Attending: Family Medicine

## 2014-09-01 ENCOUNTER — Telehealth: Payer: Self-pay | Admitting: Family Medicine

## 2014-09-01 DIAGNOSIS — E0500 Thyrotoxicosis with diffuse goiter without thyrotoxic crisis or storm: Secondary | ICD-10-CM

## 2014-09-01 MED ORDER — ATENOLOL 25 MG PO TABS: 25.0000 mg | ORAL_TABLET | Freq: Every day | ORAL | Status: DC

## 2014-09-01 NOTE — Telephone Encounter (Signed)
Pt called requesting medication refill on atenolol (TENORMIN) 25 MG tablet.  Patient states she only has a couple left . F/u appt is scheduled for 09/15/14. Please f/u with patient.

## 2014-09-01 NOTE — Telephone Encounter (Signed)
Atenolol refilled. 

## 2014-09-15 ENCOUNTER — Encounter: Payer: Self-pay | Admitting: Family Medicine

## 2014-09-15 ENCOUNTER — Ambulatory Visit: Payer: Self-pay | Attending: Family Medicine | Admitting: Family Medicine

## 2014-09-15 VITALS — BP 116/75 | HR 100 | Temp 98.4°F | Resp 16 | Ht 62.0 in | Wt 135.0 lb

## 2014-09-15 DIAGNOSIS — E119 Type 2 diabetes mellitus without complications: Secondary | ICD-10-CM | POA: Insufficient documentation

## 2014-09-15 DIAGNOSIS — E1165 Type 2 diabetes mellitus with hyperglycemia: Secondary | ICD-10-CM

## 2014-09-15 DIAGNOSIS — E0500 Thyrotoxicosis with diffuse goiter without thyrotoxic crisis or storm: Secondary | ICD-10-CM | POA: Insufficient documentation

## 2014-09-15 DIAGNOSIS — IMO0002 Reserved for concepts with insufficient information to code with codable children: Secondary | ICD-10-CM

## 2014-09-15 LAB — T3: T3, Total: 313.4 ng/dL — ABNORMAL HIGH (ref 80.0–204.0)

## 2014-09-15 LAB — TSH: TSH: <0.008 u[IU]/mL — ABNORMAL LOW (ref 0.350–4.500)

## 2014-09-15 LAB — GLUCOSE, POCT (MANUAL RESULT ENTRY): POC GLUCOSE: 59 mg/dL — AB (ref 70–99)

## 2014-09-15 LAB — T4, FREE: Free T4: 2.71 ng/dL — ABNORMAL HIGH (ref 0.80–1.80)

## 2014-09-15 LAB — POCT GLYCOSYLATED HEMOGLOBIN (HGB A1C): Hemoglobin A1C: 7.1

## 2014-09-15 MED ORDER — METFORMIN HCL ER 500 MG PO TB24: 1000.0000 mg | ORAL_TABLET | Freq: Every day | ORAL | Status: DC

## 2014-09-15 MED ORDER — ATENOLOL 25 MG PO TABS
25.0000 mg | ORAL_TABLET | Freq: Every day | ORAL | Status: DC
Start: 2014-09-15 — End: 2014-10-27

## 2014-09-15 MED ORDER — INSULIN NPH ISOPHANE & REGULAR (70-30) 100 UNIT/ML ~~LOC~~ SUSP: 5.0000 [IU] | Freq: Two times a day (BID) | SUBCUTANEOUS | Status: DC

## 2014-09-15 MED ORDER — METHIMAZOLE 10 MG PO TABS: 10.0000 mg | ORAL_TABLET | Freq: Three times a day (TID) | ORAL | Status: DC

## 2014-09-15 NOTE — Patient Instructions (Signed)
Ms. Rachael Russo,  Thank you for coming in today  1. Grave's disease: Checking thyroid studies Start methimazole 10 mg three times a day Continue atenolol  2. Diabetes: Very well controlled Continue metformin Decrease insulin to 5 U BID  F/u in 4 weeks for labs thyroid function test  F/u in 6 weeks with for hypothyroidism   Dr. Armen PickupFunches

## 2014-09-15 NOTE — Assessment & Plan Note (Addendum)
Diabetes: Very well controlled Continue metformin Decrease insulin to 5 U BID

## 2014-09-15 NOTE — Assessment & Plan Note (Signed)
Grave's disease: Checking thyroid studies Start methimazole 10 mg three times a day Continue atenolol

## 2014-09-15 NOTE — Addendum Note (Signed)
Addended by: Dessa PhiFUNCHES, Cayli Escajeda on: 09/15/2014 04:17 PM   Modules accepted: Orders

## 2014-09-15 NOTE — Progress Notes (Signed)
F/U DM Stated Glucose running 80-100 Taking medication as prescribe

## 2014-09-15 NOTE — Progress Notes (Signed)
   Subjective:    Patient ID: Rachael Russo, female    DOB: 1976/01/16, 39 y.o.   MRN: [id number] CC: f/u DM2 Spanish interpreter phone use ID # [phone] HPI  1. CHRONIC DIABETES  Disease Monitoring  Blood Sugar Ranges: 80-100  Polyuria: no   Visual problems: no   Medication Compliance: yes  Medication Side Effects  Hypoglycemia: yes   Preventitive Health Care  Eye Exam: due   Foot Exam: done      2. Grave's disease: taking atenolol. No longer losing weight. Has not started methimazole because it was sent to [facility] mistakenly. She still has slight tremor. No heat intolerance, CP, anxiety, palpitations or weight loss.   Soc Hx: non smoker  Review of Systems  Constitutional: Negative for unexpected weight change.  Cardiovascular: Negative for palpitations.  Endocrine: Negative for heat intolerance.       Objective:   Physical Exam BP 116/75 mmHg  Pulse 100  Temp(Src) 98.4 F (36.9 C) (Oral)  Resp 16  Ht 5\' 2"  (1.575 m)  Wt 135 lb (61.236 kg)  BMI 24.69 kg/m2  SpO2 98%  Pulse Readings from Last 3 Encounters:  09/15/14 100  06/30/14 99  06/12/14 76    Wt Readings from Last 3 Encounters:  09/15/14 135 lb (61.236 kg)  06/12/14 131 lb 9.6 oz (59.693 kg)  01/13/14 146 lb 4.8 oz (66.361 kg)  General appearance: alert, cooperative and no distress  Neck: no adenopathy, no JVD and thyroid not enlarged, symmetric, no tenderness/mass/nodules Lungs: clear to auscultation bilaterally Heart: regular rate and rhythm, S1, S2 normal, no murmur, click, rub or gallop Extremities: extremities normal, atraumatic, no cyanosis or edema  Lab Results  Component Value Date   TSH <0.008* 05/25/2014    Lab Results  Component Value Date   HGBA1C 7.10 09/15/2014  CBG 59     Assessment & Plan:

## 2014-10-03 ENCOUNTER — Telehealth: Payer: Self-pay | Admitting: *Deleted

## 2014-10-03 NOTE — Telephone Encounter (Signed)
LVM to return call.

## 2014-10-03 NOTE — Telephone Encounter (Signed)
-----   Message from Dessa Phi, MD sent at 09/17/2014  2:19 PM EDT ----- Persistent low TSH and high T4 and T3 consistent with hyperthyroidism Patient to start methimazole  Repeat labs in 4 weeks

## 2014-10-04 ENCOUNTER — Telehealth: Payer: Self-pay | Admitting: Family Medicine

## 2014-10-04 NOTE — Telephone Encounter (Signed)
Patient is returning phone call from RMA, please f/u with pt. °

## 2014-10-04 NOTE — Telephone Encounter (Signed)
Pt in office Aware of lab results  Results given in spanish

## 2014-10-13 ENCOUNTER — Ambulatory Visit: Payer: Self-pay | Attending: Family Medicine

## 2014-10-13 DIAGNOSIS — E0500 Thyrotoxicosis with diffuse goiter without thyrotoxic crisis or storm: Secondary | ICD-10-CM

## 2014-10-13 LAB — T4, FREE: Free T4: 0.8 ng/dL (ref 0.80–1.80)

## 2014-10-13 LAB — T3: T3 TOTAL: 87.2 ng/dL (ref 80.0–204.0)

## 2014-10-13 LAB — TSH: TSH: <0.008 u[IU]/mL — ABNORMAL LOW (ref 0.350–4.500)

## 2014-10-17 ENCOUNTER — Telehealth: Payer: Self-pay | Admitting: *Deleted

## 2014-10-17 NOTE — Telephone Encounter (Signed)
LVM to return call.

## 2014-10-17 NOTE — Telephone Encounter (Signed)
-----   Message from Dessa PhiJosalyn Funches, MD sent at 10/17/2014  9:37 AM EDT ----- TSH is still low but T3 and T4 are now in normal range, continue methimazole 10 mg TID  Will see patient at her f/u appt on 10/27/14

## 2014-10-18 NOTE — Telephone Encounter (Signed)
Patient is returning phone call, please f/u with pt. °

## 2014-10-18 NOTE — Telephone Encounter (Signed)
Pt aware of results 

## 2014-10-27 ENCOUNTER — Ambulatory Visit: Payer: Self-pay | Attending: Family Medicine | Admitting: Family Medicine

## 2014-10-27 ENCOUNTER — Encounter: Payer: Self-pay | Admitting: Family Medicine

## 2014-10-27 VITALS — BP 127/82 | HR 81 | Temp 98.7°F | Resp 16 | Ht 62.0 in | Wt 144.0 lb

## 2014-10-27 DIAGNOSIS — IMO0002 Reserved for concepts with insufficient information to code with codable children: Secondary | ICD-10-CM

## 2014-10-27 DIAGNOSIS — E1165 Type 2 diabetes mellitus with hyperglycemia: Secondary | ICD-10-CM

## 2014-10-27 DIAGNOSIS — E119 Type 2 diabetes mellitus without complications: Secondary | ICD-10-CM

## 2014-10-27 DIAGNOSIS — K088 Other specified disorders of teeth and supporting structures: Secondary | ICD-10-CM

## 2014-10-27 DIAGNOSIS — E0500 Thyrotoxicosis with diffuse goiter without thyrotoxic crisis or storm: Secondary | ICD-10-CM

## 2014-10-27 DIAGNOSIS — K089 Disorder of teeth and supporting structures, unspecified: Secondary | ICD-10-CM | POA: Insufficient documentation

## 2014-10-27 LAB — GLUCOSE, POCT (MANUAL RESULT ENTRY): POC Glucose: 168 mg/dl — AB (ref 70–99)

## 2014-10-27 MED ORDER — METFORMIN HCL ER 500 MG PO TB24
1000.0000 mg | ORAL_TABLET | Freq: Every day | ORAL | Status: DC
Start: 2014-10-27 — End: 2015-05-01

## 2014-10-27 MED ORDER — METHIMAZOLE 10 MG PO TABS: 10.0000 mg | ORAL_TABLET | Freq: Three times a day (TID) | ORAL | Status: DC

## 2014-10-27 MED ORDER — INSULIN NPH ISOPHANE & REGULAR (70-30) 100 UNIT/ML ~~LOC~~ SUSP: 5.0000 [IU] | Freq: Two times a day (BID) | SUBCUTANEOUS | Status: DC

## 2014-10-27 MED ORDER — ATENOLOL 25 MG PO TABS: 25.0000 mg | ORAL_TABLET | Freq: Every day | ORAL | Status: DC

## 2014-10-27 NOTE — Progress Notes (Signed)
F/U TSH

## 2014-10-27 NOTE — Patient Instructions (Signed)
Ms. Rachael Russo,  Thank you for coming in today  1. Hyperthyroidism: Symptoms improved Continue atelnolol and methimazole  2. Diabetes: Blood sugars well controlled Continue metformin and insulin  Referrals to opthalmology and dentist placed today  F/u in 6 weeks for diabetes and hyperthyroidism  Dr. Armen PickupFunches

## 2014-10-27 NOTE — Assessment & Plan Note (Signed)
Hyperthyroidism: Symptoms improved Continue atelnolol and methimazole

## 2014-10-27 NOTE — Assessment & Plan Note (Signed)
Dental referral placed.

## 2014-10-27 NOTE — Progress Notes (Signed)
   Subjective:    Patient ID: Rachael Russo, female    DOB: 06-03-1975, 39 y.o.   MRN: [id number] CC; f/u  Spanish interpreter present  HPI 39 yo F with DM2 and hyperthyroidism  1. DM2: compliant with regimen. CBGs well controlled. Weight stable. Patient has not seen an ophthalmologist within the last year.   2. Hyperthyroidism: taking methimazole and atenolol. No tremor, heat intolerance, weight change or sleep disturbance.  3. Dental referral: had intermittent discomfort in R upper molars. No pain today or swelling. Patient does not have a dental home.   Soc Hx: non smoker  Review of Systems  Constitutional: Negative for fever, chills and unexpected weight change.  Respiratory: Negative for shortness of breath.   Cardiovascular: Negative for chest pain.  Gastrointestinal: Negative for abdominal pain and blood in stool.  Neurological: Negative for tremors.  Psychiatric/Behavioral: Negative for sleep disturbance and dysphoric mood. The patient is not nervous/anxious and is not hyperactive.        Objective:   Physical Exam BP 127/82 mmHg  Pulse 81  Temp(Src) 98.7 F (37.1 C) (Oral)  Resp 16  Ht 5\' 2"  (1.575 m)  Wt 144 lb (65.318 kg)  BMI 26.33 kg/m2  SpO2 99%  LMP 10/07/2014  Wt Readings from Last 3 Encounters:  10/27/14 144 lb (65.318 kg)  09/15/14 135 lb (61.236 kg)  06/12/14 131 lb 9.6 oz (59.693 kg)  General appearance: alert, cooperative and no distress  Neck: soft non tender, thyroid is non tender and non enlarged  Lungs: clear to auscultation bilaterally Heart: regular rate and rhythm, S1, S2 normal, no murmur, click, rub or gallop  Lab Results  Component Value Date   HGBA1C 7.10 09/15/2014   CBG 168     Assessment & Plan:

## 2014-10-27 NOTE — Assessment & Plan Note (Signed)
Diabetes: Blood sugars well controlled Continue metformin and insulin

## 2014-11-30 IMAGING — US US OB FOLLOW-UP
1 series · 12 of 28 positions shown · non-contrast
Comparison: none

[Series 1: us ob follow-up · 0.14mm/px · 12 of 40 slices shown]
[im 2/40]
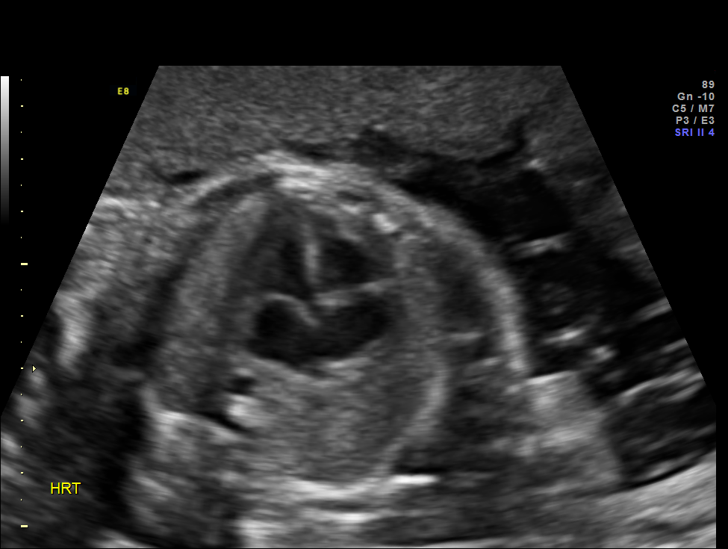
[im 5/40]
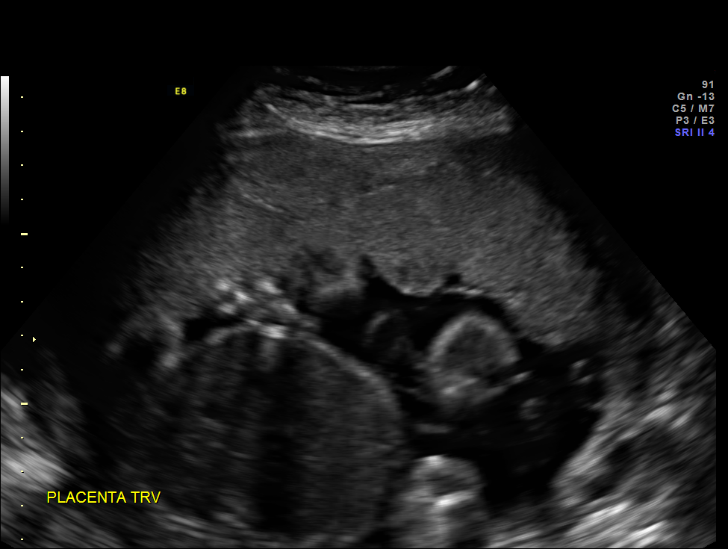
[im 8/40]
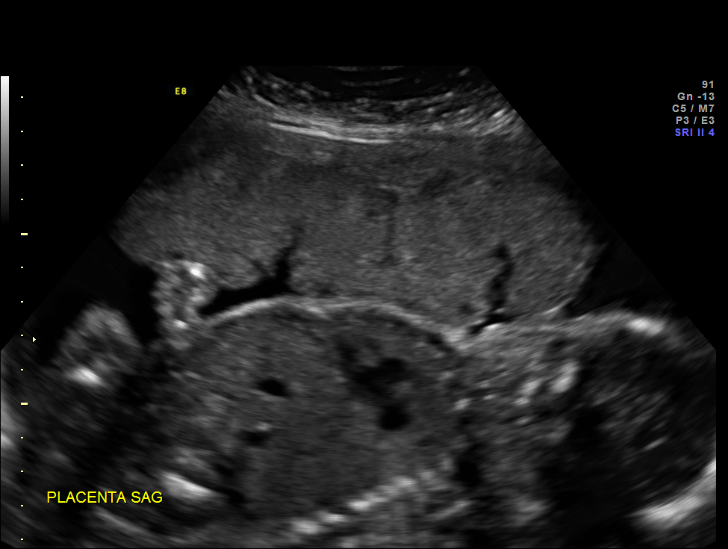
[im 12/40]
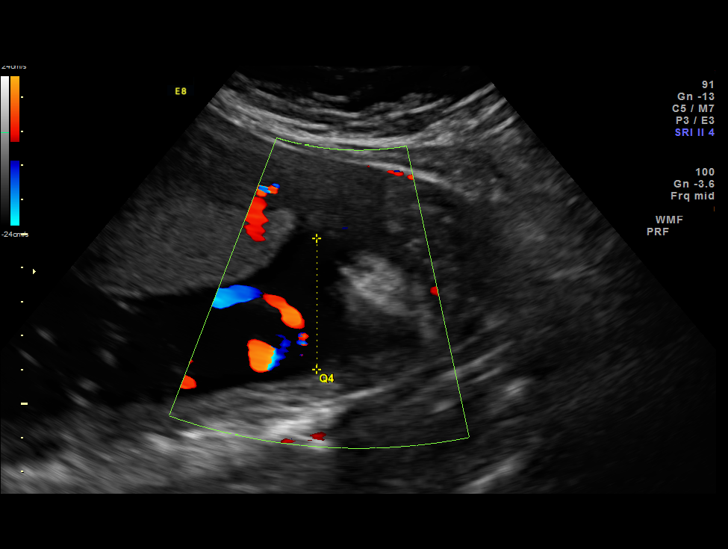
[im 15/40]
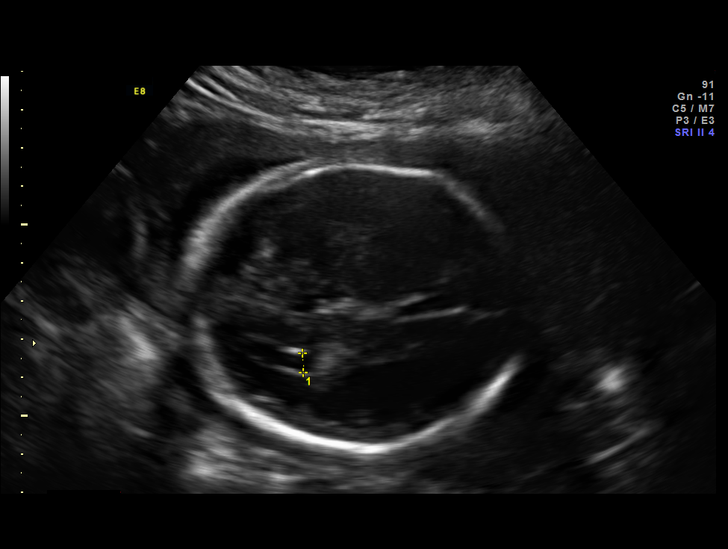
[im 18/40]
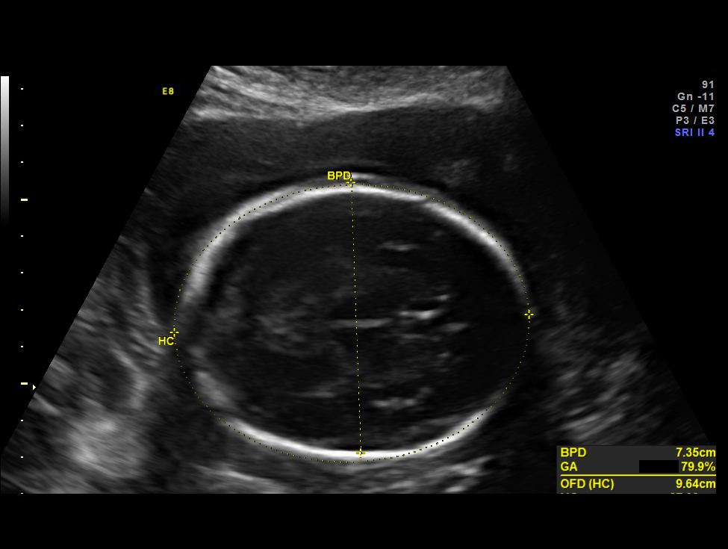
[im 22/40]
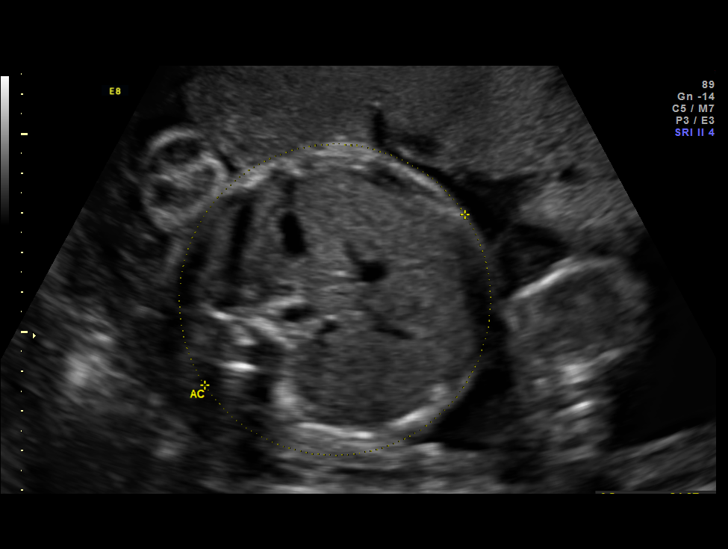
[im 25/40]
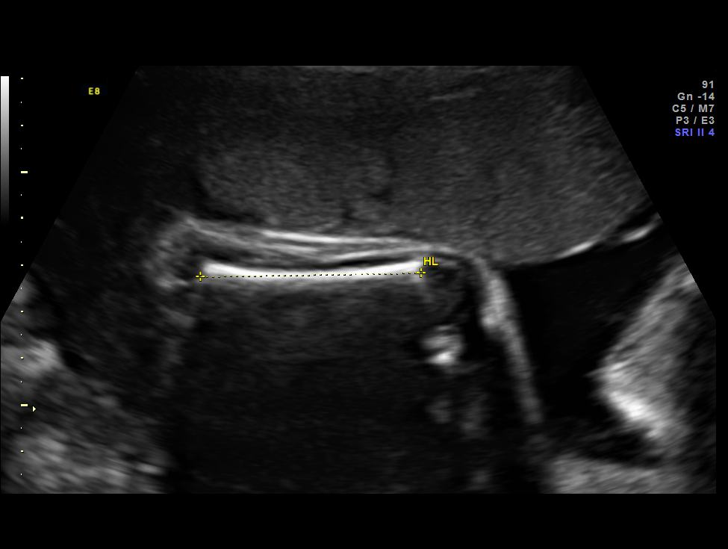
[im 28/40]
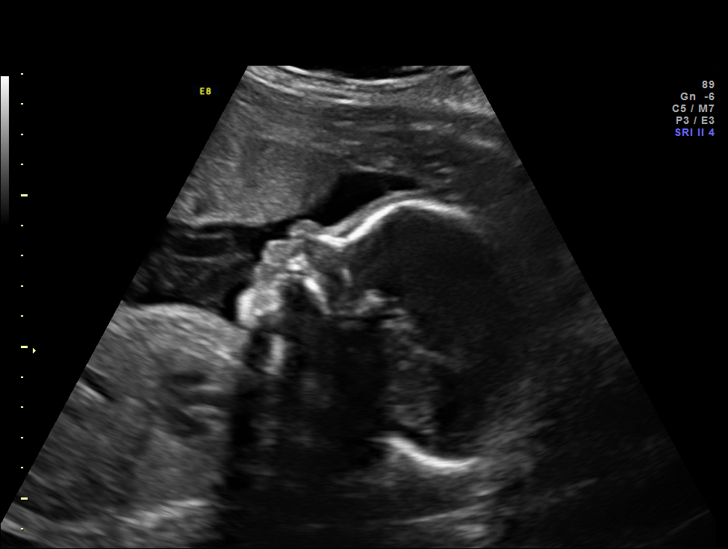
[im 32/40]
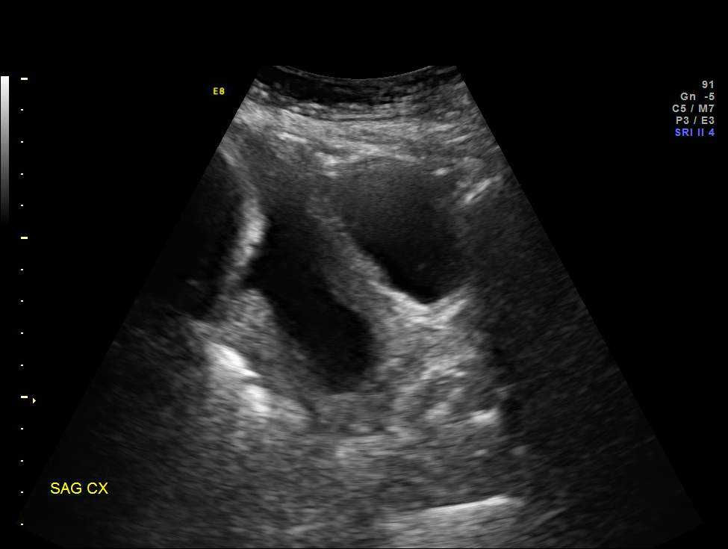
[im 35/40]
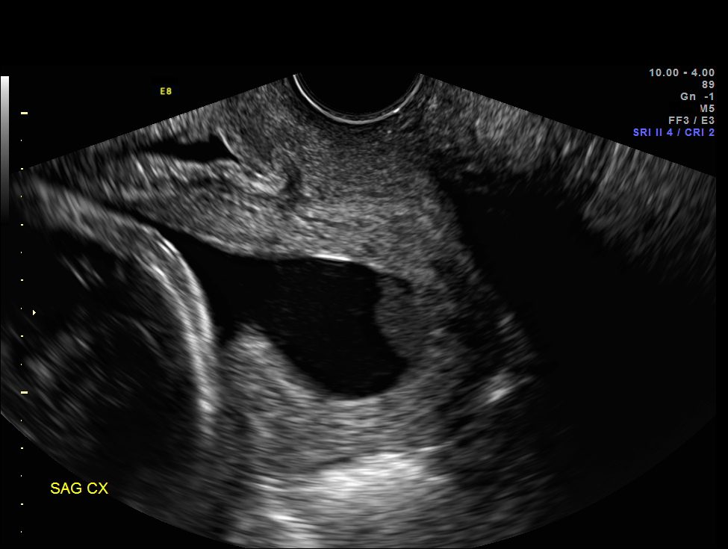
[im 38/40]
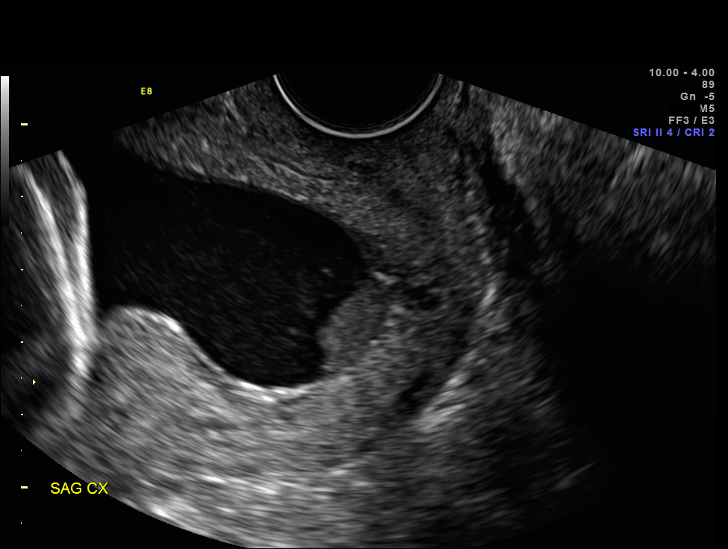

[12 of 28 positions shown; findings below may reference images not displayed]

OBSTETRICS REPORT
                      (Signed Final 09/21/2013 [DATE])

Service(s) Provided

 US OB FOLLOW UP                                       76816.1
 US MFM OB TRANSVAGINAL                                76817.2
Indications

 Cervical insufficiency; S/P BMZ [DATE] & [DATE]
 Advanced maternal age (37), Multigravida; low risk
 quad screen
 Diabetes - Pregestational, Class B (onset [AGE]
 or >, with duration < 44yrs) - Normal fetal ECHO
 Poor obstetric history: Previous preterm delivery @
 35 weeks (Currently on 17P)
 History of cesarean delivery, currently pregnant      654.20,
Fetal Evaluation

 Num Of Fetuses:    1
 Fetal Heart Rate:  139                          bpm
 Cardiac Activity:  Observed
 Presentation:      Cephalic
 Placenta:          Anterior, above cervical os
 P. Cord            Previously Visualized
 Insertion:

 Amniotic Fluid
 AFI FV:      Subjectively within normal limits
 AFI Sum:     16.92   cm       63  %Tile     Larg Pckt:    5.06  cm
 RUQ:   4.84    cm   RLQ:    3.86   cm    LUQ:   3.16    cm   LLQ:    5.06   cm
Biometry

 BPD:     73.1  mm     G. Age:  29w 2d                CI:         75.8   70 - 86
 OFD:     96.5  mm                                    FL/HC:      19.6   18.8 -

 HC:     270.9  mm     G. Age:  29w 4d       63  %    HC/AC:      1.11   1.05 -

 AC:     243.7  mm     G. Age:  28w 5d       57  %    FL/BPD:     72.8   71 - 87
 FL:      53.2  mm     G. Age:  28w 2d       38  %    FL/AC:      21.8   20 - 24
 HUM:     47.2  mm     G. Age:  27w 5d       38  %

 Est. FW:    6248  gm    2 lb 12 oz      61  %
Gestational Age

 LMP:           28w 1d        Date:  03/08/13                 EDD:   12/13/13
 U/S Today:     29w 0d                                        EDD:   12/07/13
 Best:          28w 1d     Det. By:  LMP  (03/08/13)          EDD:   12/13/13
Anatomy

 Cranium:          Appears normal         Aortic Arch:      Previously seen
 Fetal Cavum:      Appears normal         Ductal Arch:      Previously seen
 Ventricles:       Appears normal         Diaphragm:        Previously seen
 Choroid Plexus:   Previously seen        Stomach:          Appears normal, left
                                                            sided
 Cerebellum:       Previously seen        Abdomen:          Appears normal
 Posterior Fossa:  Previously seen        Abdominal Wall:   Previously seen
 Nuchal Fold:      Previously seen        Cord Vessels:     Previously seen
 Face:             Orbits and profile     Kidneys:          Appear normal
                   previously seen
 Lips:             Previously seen        Bladder:          Appears normal
 Heart:            Appears normal         Spine:            Previously seen
                   (4CH, axis, and
                   situs)
 RVOT:             Previously seen        Lower             Previously seen
                                          Extremities:
 LVOT:             Previously seen        Upper             Previously seen
                                          Extremities:

 Other:  Fetus appears to be a male. Heels previously visualized. Nasal bone
         previously visualized.
Cervix Uterus Adnexa

 Cervix:       Appears funnelled, see comments.

 Adnexa:     No abnormality visualized.
Impression

 Single living intrauterine pregnancy at 28 weeks 1 day.
 Appropriate interval fetal growth (61%).
 Normal amniotic fluid volume.
 Normal interval fetal anatomy.
 "U" shaped cervical funneling is again seen.
 Residual cervical length of 0.43 cm is unchanged from prior
 studies.
Recommendations

 Recommend follow-up ultrasound examination in 4 weeks to
 reassess fetal growth.
 Recommend twice weekly NST's and once weekly AFI's
 starting at 32 weeks.

 questions or concerns.
                Tanner, Leilani

## 2014-12-07 ENCOUNTER — Encounter: Payer: Self-pay | Admitting: Family Medicine

## 2014-12-07 ENCOUNTER — Ambulatory Visit: Payer: Self-pay | Attending: Family Medicine | Admitting: Family Medicine

## 2014-12-07 VITALS — BP 125/79 | HR 70 | Temp 98.2°F | Resp 16 | Ht 62.0 in | Wt 151.0 lb

## 2014-12-07 DIAGNOSIS — IMO0002 Reserved for concepts with insufficient information to code with codable children: Secondary | ICD-10-CM

## 2014-12-07 DIAGNOSIS — E0500 Thyrotoxicosis with diffuse goiter without thyrotoxic crisis or storm: Secondary | ICD-10-CM | POA: Insufficient documentation

## 2014-12-07 DIAGNOSIS — Z79899 Other long term (current) drug therapy: Secondary | ICD-10-CM | POA: Insufficient documentation

## 2014-12-07 DIAGNOSIS — R200 Anesthesia of skin: Secondary | ICD-10-CM | POA: Insufficient documentation

## 2014-12-07 DIAGNOSIS — E119 Type 2 diabetes mellitus without complications: Secondary | ICD-10-CM | POA: Insufficient documentation

## 2014-12-07 DIAGNOSIS — E1165 Type 2 diabetes mellitus with hyperglycemia: Secondary | ICD-10-CM

## 2014-12-07 LAB — TSH: TSH: 13.242 u[IU]/mL — ABNORMAL HIGH (ref 0.350–4.500)

## 2014-12-07 LAB — T4, FREE: Free T4: 0.43 ng/dL — ABNORMAL LOW (ref 0.80–1.80)

## 2014-12-07 LAB — GLUCOSE, POCT (MANUAL RESULT ENTRY): POC Glucose: 210 mg/dl — AB (ref 70–99)

## 2014-12-07 LAB — T3, FREE: T3, Free: 1.8 pg/mL — ABNORMAL LOW (ref 2.3–4.2)

## 2014-12-07 LAB — T3: T3 TOTAL: 61.3 ng/dL — AB (ref 80.0–204.0)

## 2014-12-07 MED ORDER — INSULIN NPH ISOPHANE & REGULAR (70-30) 100 UNIT/ML ~~LOC~~ SUSP: 6.0000 [IU] | Freq: Two times a day (BID) | SUBCUTANEOUS | Status: DC

## 2014-12-07 NOTE — Progress Notes (Signed)
F/U TSH, DM Glucose elevated today. Stated took Metformin at 09;00 am,  Insulin 5unit 70/30 at 10;00 Had a sandwich for breakfast about an hour ago  Complaining of hand pain and numbness on rt hand Sx worsen at night No Hx tobacco

## 2014-12-07 NOTE — Assessment & Plan Note (Addendum)
Hyperthyroidism:  Improved symptoms Normal exam today Rechecking labs meds refilled  Patient has now become hypothyroid in current dose of methimazole Decrease methimazole to 15 mg once daily (1 and a half tabs) maintenance dose

## 2014-12-07 NOTE — Progress Notes (Signed)
Subjective:    Patient ID: Rachael Russo, female    DOB: 1975/05/09, 39 y.o.   MRN: [id number] CC: f/u grave disease, diabetes, numbness in R finger  HPI 39 yo F with Grave's disease and diabetes presents for f/u visit: Spanish interpreter present  1. Grave's disease: taking methimazole and atenolol. No weight loss, palpitations, trouble falling asleep.   2. Diabetes: fasting CBG 120-130. Taking 5 U BID of novolin and metfomrin 1000 mg BID. No numbness in hands or feet except R 4th finger.   3. Right 4th finger numbness: occurs at night around 4 AM. No weakness or pain. Patient checks her blood sugar on this finger often. No skin changes or rash. No trauma. Present for past few weeks.    Current Outpatient Prescriptions on File Prior to Visit  Medication Sig Dispense Refill  . atenolol (TENORMIN) 25 MG tablet Take 1 tablet (25 mg total) by mouth daily. 30 tablet 5  . Blood Glucose Monitoring Suppl (TRUERESULT BLOOD GLUCOSE) W/DEVICE KIT 1 each by Does not apply route 2 (two) times daily before a meal. 1 each 0  . glucose blood (TRUETEST TEST) test strip 1 each by Other route 3 (three) times daily. Use as instructed 100 each 12  . metFORMIN (GLUCOPHAGE XR) 500 MG 24 hr tablet Take 2 tablets (1,000 mg total) by mouth daily after supper. 60 tablet 5  . methimazole (TAPAZOLE) 10 MG tablet Take 1 tablet (10 mg total) by mouth 3 (three) times daily. 90 tablet 5  . Prenatal Vit-Fe Fumarate-FA (PRENATAL MULTIVITAMIN) TABS tablet Take 1 tablet by mouth daily. 30 tablet 2  . TRUEPLUS LANCETS 28G MISC 1 each by Does not apply route 2 (two) times daily. 100 each 12   No current facility-administered medications on file prior to visit.    Social History  Substance Use Topics  . Smoking status: Never Smoker   . Smokeless tobacco: Not on file  . Alcohol Use: No   Review of Systems  Constitutional: Negative for fever, chills and unexpected weight change.  HENT: Negative for trouble swallowing and  voice change.   Eyes: Negative for visual disturbance.  Respiratory: Negative for shortness of breath.   Cardiovascular: Negative for chest pain and palpitations.  Gastrointestinal: Negative for abdominal pain and blood in stool.  Musculoskeletal: Negative for back pain and arthralgias.  Skin: Negative for rash.  Allergic/Immunologic: Negative for immunocompromised state.  Neurological: Positive for numbness (in 4th finger of R hand that occurs around 4 AM ). Negative for tremors.  Hematological: Negative for adenopathy. Does not bruise/bleed easily.  Psychiatric/Behavioral: Negative for suicidal ideas and dysphoric mood.      Objective:   Physical Exam  Constitutional: She is oriented to person, place, and time. She appears well-developed and well-nourished. No distress.  Eyes: Conjunctivae and EOM are normal. Pupils are equal, round, and reactive to light.  Neck: No tracheal tenderness present. No thyroid mass and no thyromegaly present.  Cardiovascular: Normal rate, regular rhythm, normal heart sounds and intact distal pulses.   Pulmonary/Chest: Effort normal and breath sounds normal.  Musculoskeletal: She exhibits no edema.  Neurological: She is alert and oriented to person, place, and time. She displays no tremor.  Skin: Skin is warm and dry. No rash noted.  Psychiatric: She has a normal mood and affect.  BP 125/79 mmHg  Pulse 70  Temp(Src) 98.2 F (36.8 C) (Oral)  Resp 16  Ht 5' 2"  (1.575 m)  Wt 151 lb (68.493 kg)  BMI 27.61 kg/m2  SpO2 100%  LMP 11/30/2014 Wt Readings from Last 3 Encounters:  12/07/14 151 lb (68.493 kg)  10/27/14 144 lb (65.318 kg)  09/15/14 135 lb (61.236 kg)   Lab Results  Component Value Date   HGBA1C 7.10 09/15/2014   CBG 210      Assessment & Plan:

## 2014-12-07 NOTE — Assessment & Plan Note (Signed)
  Diabetes Fasting CBGs slightly above goal, Increase insulin to 6 U twice daly   Diabetes blood sugar goals  Fasting (in AM before breakfast, 8 hrs of no eating or drinking (except water or unsweetened coffee or tea): 90-110 2 hrs after meals: < 160,   No low sugars: nothing < 70

## 2014-12-07 NOTE — Assessment & Plan Note (Signed)
Numbness in 4th finger of R hand: normal exam, I suspect pinched nerve  Wear wrist splint at night on R hand

## 2014-12-07 NOTE — Patient Instructions (Addendum)
Mrs. Comer Locket,   1. Hyperthyroidism:  Improved symptoms Normal exam today Rechecking labs meds refilled  2. Diabetes Fasting CBGs slightly above goal, Increase insulin to 6 U twice daly   Diabetes blood sugar goals  Fasting (in AM before breakfast, 8 hrs of no eating or drinking (except water or unsweetened coffee or tea): 90-110 2 hrs after meals: < 160,   No low sugars: nothing < 70   3. Numbness in 4th finger of R hand: normal exam, I suspect pinched nerve  Wear wrist splint at night on R hand   F/u in 2 months for hyperthyroidism and repeat A1c  Dr. Armen Pickup

## 2014-12-08 MED ORDER — METHIMAZOLE 10 MG PO TABS: 15.0000 mg | ORAL_TABLET | Freq: Every day | ORAL | Status: DC

## 2014-12-08 NOTE — Addendum Note (Signed)
Addended by: Dessa Phi on: 12/08/2014 10:54 AM   Modules accepted: Orders

## 2014-12-15 ENCOUNTER — Ambulatory Visit: Payer: Self-pay

## 2014-12-21 ENCOUNTER — Ambulatory Visit: Payer: Self-pay | Attending: Family Medicine

## 2014-12-22 ENCOUNTER — Telehealth: Payer: Self-pay | Admitting: *Deleted

## 2014-12-22 NOTE — Telephone Encounter (Signed)
-----   Message from Dessa Phi, MD sent at 12/08/2014 10:53 AM EDT ----- Patient has now become hypothyroid in current dose of methimazole Decrease methimazole to 15 mg once daily (1 and a half tabs) maintenance dose

## 2014-12-22 NOTE — Telephone Encounter (Signed)
Date of birth verified by pt  Pt aware of medicine changes and lab results  Advised to take methimazole 15 mg, 1 1/2 pill daily  Pt verified understanding

## 2014-12-28 IMAGING — US US OB FOLLOW-UP
1 series · 12 of 28 positions shown · non-contrast
Comparison: none

OBSTETRICS REPORT
                      (Signed Final 10/19/2013 [DATE])

Service(s) Provided
 US OB FOLLOW UP                                       76816.1
Indications
 Cervical insufficiency; S/P BMZ [DATE] & [DATE]
 Advanced maternal age (37), Multigravida; low risk
 quad screen
 Diabetes - Pregestational, Class B (onset [AGE]
 or >, with duration < 57yrs) - Normal fetal ECHO
 Poor obstetric history: Previous preterm delivery @
 35 weeks (Currently on 17P)
 History of cesarean delivery, currently pregnant      654.20,
Fetal Evaluation
 Num Of Fetuses:    1
 Fetal Heart Rate:  138                          bpm
 Cardiac Activity:  Observed
 Presentation:      Cephalic
 Placenta:          Anterior, above cervical os
 P. Cord            Previously Visualized
 Insertion:
 Amniotic Fluid
 AFI FV:      Subjectively within normal limits
 AFI Sum:     14.73   cm       52  %Tile     Larg Pckt:    6.06  cm
 RUQ:   6.06    cm   RLQ:    3.21   cm    LUQ:   3.79    cm   LLQ:    1.67   cm
Biophysical Evaluation
 Amniotic F.V:   Pocket => 2 cm two         F. Tone:        Observed
                 planes
 F. Movement:    Observed                   Score:          [DATE]
 F. Breathing:   Observed
Biometry
 BPD:     79.2  mm     G. Age:  31w 5d                CI:        69.87   70 - 86
                                                      FL/HC:      19.9   19.1 -
 HC:     302.3  mm     G. Age:  33w 4d       51  %    HC/AC:      0.96   0.96 -
 AC:     315.8  mm     G. Age:  35w 3d     > 97  %    FL/BPD:     75.9   71 - 87
 FL:      60.1  mm     G. Age:  31w 2d       18  %    FL/AC:      19.0   20 - 24
 HUM:     54.3  mm     G. Age:  31w 4d       41  %
 Est. FW:    6691  gm           5 lb     81  %
Gestational Age
 LMP:           32w 1d        Date:  03/08/13                 EDD:   12/13/13
 U/S Today:     33w 0d                                        EDD:   12/07/13
 Best:          32w 1d     Det. By:  LMP  (03/08/13)          EDD:   12/13/13
Anatomy
 Cranium:          Appears normal         Aortic Arch:      Previously seen
 Fetal Cavum:      Previously seen        Ductal Arch:      Previously seen
 Ventricles:       Appears normal         Diaphragm:        Previously seen
 Choroid Plexus:   Previously seen        Stomach:          Appears normal, left
                                                            sided
 Cerebellum:       Previously seen        Abdomen:          Appears normal
 Posterior Fossa:  Previously seen        Abdominal Wall:   Previously seen
 Nuchal Fold:      Previously seen        Cord Vessels:     Previously seen
 Face:             Orbits and profile     Kidneys:          Appear normal
                   previously seen
 Lips:             Previously seen        Bladder:          Appears normal
 Heart:            Appears normal         Spine:            Previously seen
                   (4CH, axis, and
                   situs)
 RVOT:             Previously seen        Lower             Previously seen
                                          Extremities:
 LVOT:             Previously seen        Upper             Previously seen
 Other:  Male gender previously seen. Heels previously visualized. Nasal bone
         previously visualized.
Cervix Uterus Adnexa
 Cervix:       Not visualized (advanced GA >37wks)
Impression
INDICATION: 37 yr old [id number] at 61w2d with type II diabetes,
 history of preterm delivery, and previous delivery at 21 weeks
 with chorioamnionitis now with short cervix for fetal growth
 and BPP.

[Series 1: us ob follow-up · 12 of 31 slices shown]
[im 2/31]
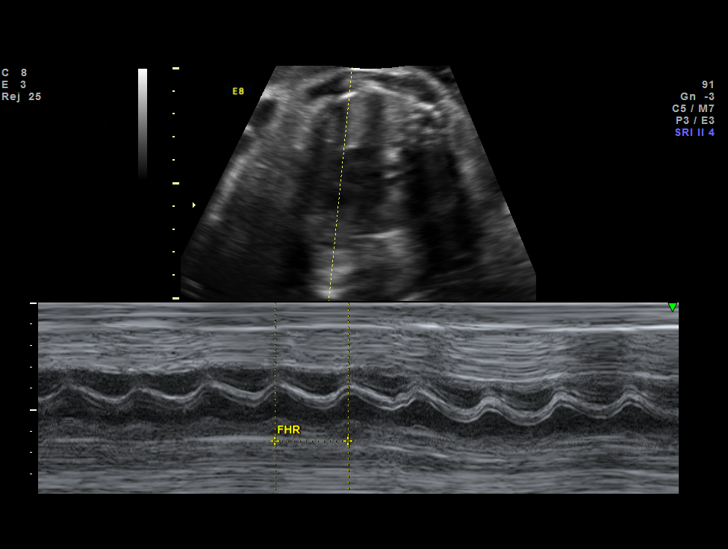
[im 4/31]
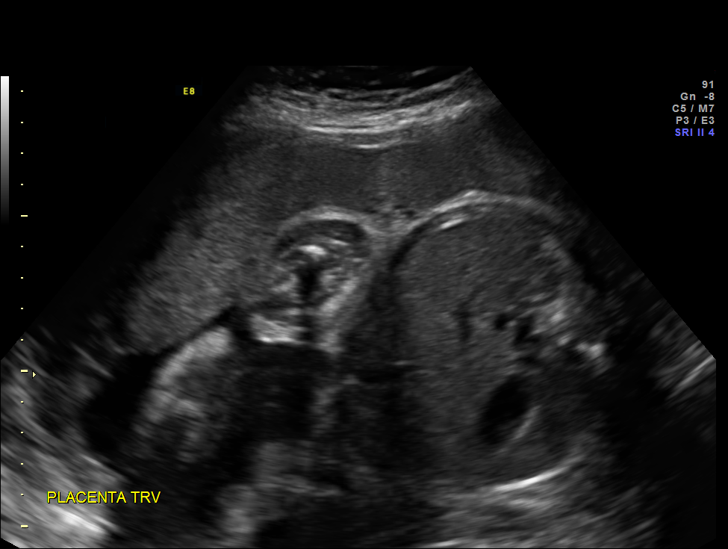
[im 6/31]
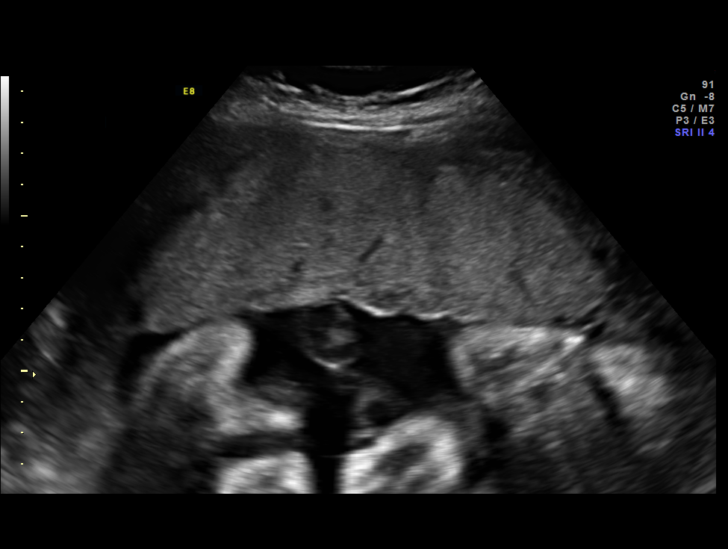
[im 9/31]
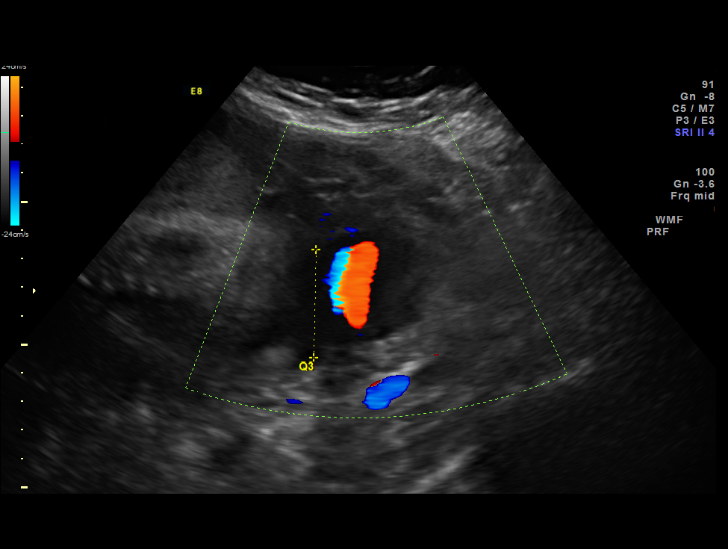
[im 12/31]
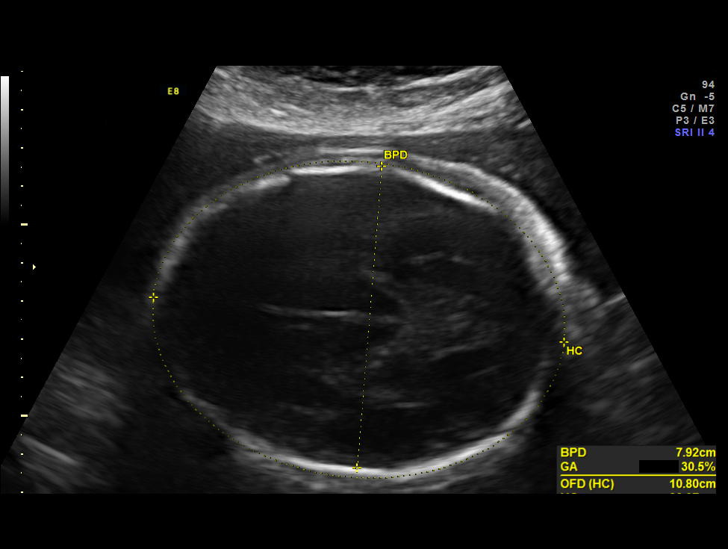
[im 14/31]
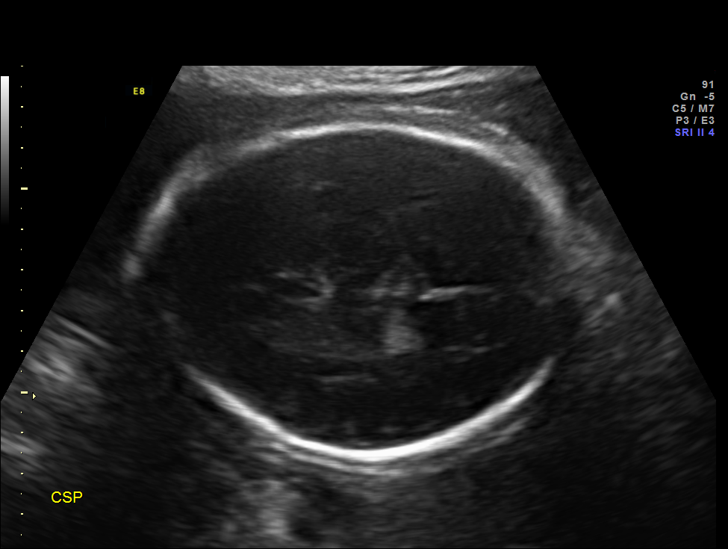
[im 17/31]
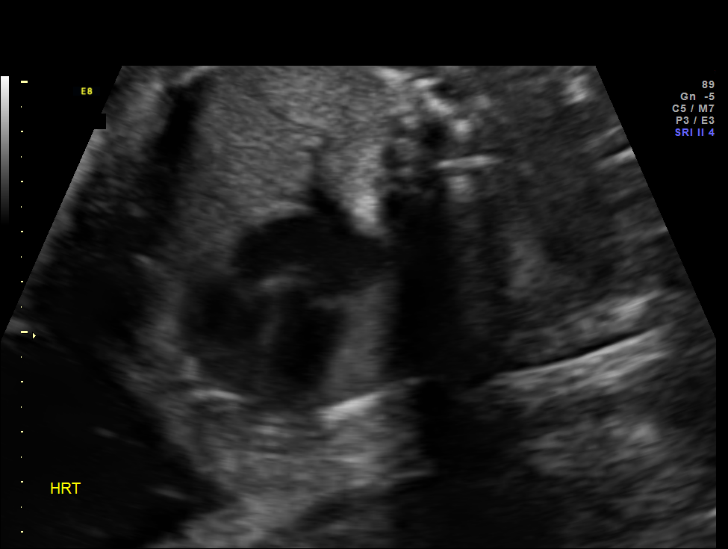
[im 19/31]
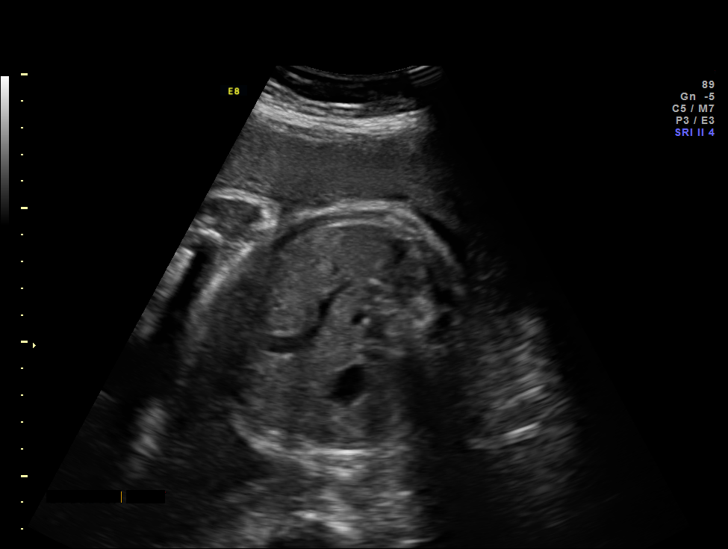
[im 22/31]
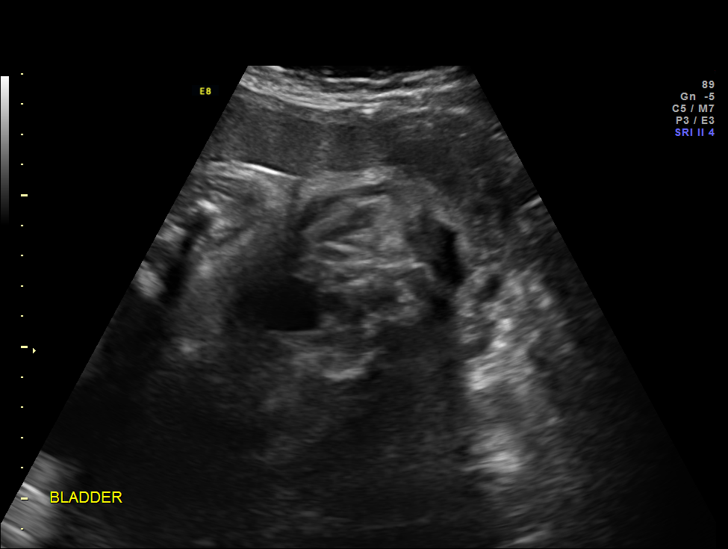
[im 25/31]
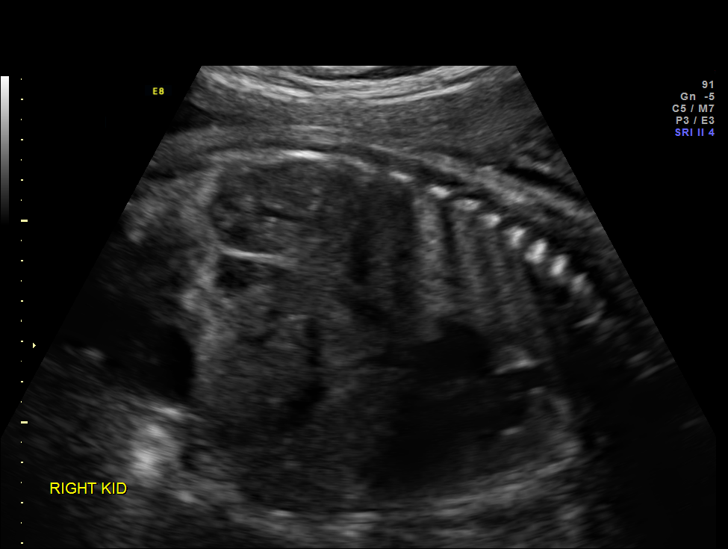
[im 27/31]
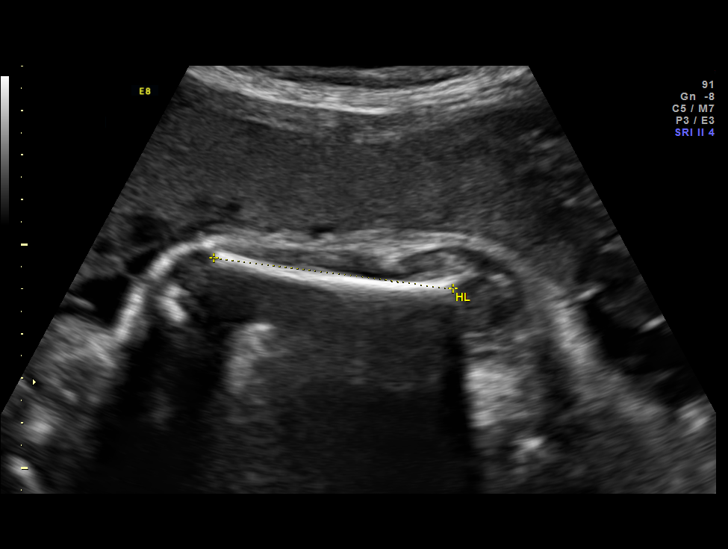
[im 29/31]
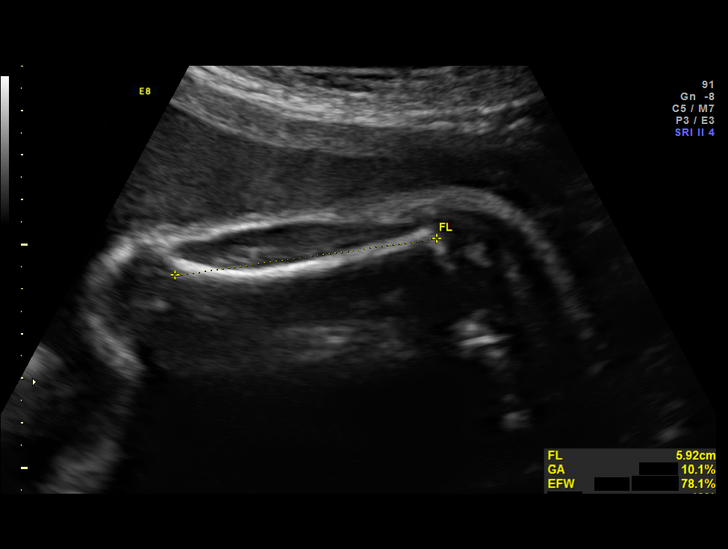

[12 of 28 positions shown; findings below may reference images not displayed]

FINDINGS: 1. Single intrauterine pregnancy.
 2. Estimated fetal weight is in the 81st%. The abdominal
 circumference is accelerated.
 3. Anterior placenta without evidence of previa.
 4. Normal amniotic fluid index.
 5. The limited anatomy survey is normal.
 6. Normal biophysical profile of [DATE].
Recommendations

 1. Appropriate fetal growth.
 2. Advanced maternal age:
 - counseled by primary OB
 - had normal quad screen.
 3. Diabetes:
 - counseled by primary OB
 - on insulin
 - recommend fetal growth every 4 weeks
 - normal fetal echocardiogram
 - recommend continue antenatal testing
 4. Previous preterm deliveries/ short cervix:
 - counseled by primary OB
 - on 17 OH progesterone and vaginal progesterone
 - recommend preterm labor precautions

 questions or concerns.

## 2015-02-02 ENCOUNTER — Ambulatory Visit: Payer: Self-pay | Attending: Family Medicine | Admitting: Family Medicine

## 2015-02-02 ENCOUNTER — Encounter: Payer: Self-pay | Admitting: Family Medicine

## 2015-02-02 VITALS — BP 115/74 | HR 88 | Temp 98.0°F | Resp 18 | Ht 62.0 in | Wt 150.0 lb

## 2015-02-02 DIAGNOSIS — Z794 Long term (current) use of insulin: Secondary | ICD-10-CM | POA: Insufficient documentation

## 2015-02-02 DIAGNOSIS — Z23 Encounter for immunization: Secondary | ICD-10-CM | POA: Insufficient documentation

## 2015-02-02 DIAGNOSIS — E119 Type 2 diabetes mellitus without complications: Secondary | ICD-10-CM | POA: Insufficient documentation

## 2015-02-02 DIAGNOSIS — Z79899 Other long term (current) drug therapy: Secondary | ICD-10-CM | POA: Insufficient documentation

## 2015-02-02 DIAGNOSIS — Z7984 Long term (current) use of oral hypoglycemic drugs: Secondary | ICD-10-CM | POA: Insufficient documentation

## 2015-02-02 DIAGNOSIS — E0500 Thyrotoxicosis with diffuse goiter without thyrotoxic crisis or storm: Secondary | ICD-10-CM | POA: Insufficient documentation

## 2015-02-02 DIAGNOSIS — Z0000 Encounter for general adult medical examination without abnormal findings: Secondary | ICD-10-CM

## 2015-02-02 LAB — GLUCOSE, POCT (MANUAL RESULT ENTRY): POC Glucose: 128 mg/dl — AB (ref 70–99)

## 2015-02-02 LAB — POCT GLYCOSYLATED HEMOGLOBIN (HGB A1C): Hemoglobin A1C: 7.2

## 2015-02-02 NOTE — Progress Notes (Signed)
F/U DM Pt fasting today  Glucose running around 120-130 fasting  No hx tobacco

## 2015-02-02 NOTE — Assessment & Plan Note (Signed)
A: diabetes well controlled. HM up to date P: Continue current regimen

## 2015-02-02 NOTE — Patient Instructions (Signed)
Rachael Russo was seen today for diabetes.  Diagnoses and all orders for this visit:  Controlled type 2 diabetes mellitus without complication, unspecified long term insulin use status (HCC) -     POCT glycosylated hemoglobin (Hb A1C) -     POCT glucose (manual entry)  Graves disease -     TSH -     T3, Free -     T4, free  Healthcare maintenance -     Flu Vaccine QUAD 36+ mos IM   You will be called with lab results. You are doing well.   F/u in 3 months for diabetes and Grave's disease  Dr. Armen PickupFunches

## 2015-02-02 NOTE — Assessment & Plan Note (Signed)
A: euthyroid per symptoms review P: Check TFTs today

## 2015-02-02 NOTE — Progress Notes (Signed)
Patient ID: Rachael Russo, female   DOB: 1975/05/19, 39 y.o.   MRN: [id number]   Subjective:  Patient ID: Rachael Russo, female    DOB: [birth year]  Age: 39 y.o. MRN: [id number]  CC: Diabetes   HPI Rachael Russo presents for f/u Spanish interpreter present. She comes today with your youngest child Dorian.    1. CHRONIC DIABETES  Disease Monitoring  Blood Sugar Ranges: normal   Polyuria: no   Visual problems: no, went to eye doctor. Given Rx for glasses to wear while driving.    Medication Compliance: yes  Medication Side Effects  Hypoglycemia: no   Preventitive Health Care  Eye Exam: done   Foot Exam: done     2. Grave's disease: taking methimazole as 15 mg daily. No HA, CP, palpitations, tremor, weight changes or anxiety.   Social History  Substance Use Topics  . Smoking status: Never Smoker   . Smokeless tobacco: Not on file  . Alcohol Use: No    Outpatient Prescriptions Prior to Visit  Medication Sig Dispense Refill  . atenolol (TENORMIN) 25 MG tablet Take 1 tablet (25 mg total) by mouth daily. 30 tablet 5  . Blood Glucose Monitoring Suppl (TRUERESULT BLOOD GLUCOSE) W/DEVICE KIT 1 each by Does not apply route 2 (two) times daily before a meal. 1 each 0  . glucose blood (TRUETEST TEST) test strip 1 each by Other route 3 (three) times daily. Use as instructed 100 each 12  . insulin NPH-regular Human (NOVOLIN 70/30) (70-30) 100 UNIT/ML injection Inject 6 Units into the skin 2 (two) times daily with a meal. 10 mL 5  . metFORMIN (GLUCOPHAGE XR) 500 MG 24 hr tablet Take 2 tablets (1,000 mg total) by mouth daily after supper. 60 tablet 5  . methimazole (TAPAZOLE) 10 MG tablet Take 1.5 tablets (15 mg total) by mouth daily. 60 tablet 5  . Prenatal Vit-Fe Fumarate-FA (PRENATAL MULTIVITAMIN) TABS tablet Take 1 tablet by mouth daily. 30 tablet 2  . TRUEPLUS LANCETS 28G MISC 1 each by Does not apply route 2 (two) times daily. 100 each 12   No facility-administered medications prior to  visit.    ROS Review of Systems  Constitutional: Negative for fever and chills.  Eyes: Negative for visual disturbance.  Respiratory: Negative for shortness of breath.   Cardiovascular: Negative for chest pain.  Gastrointestinal: Negative for abdominal pain and blood in stool.  Musculoskeletal: Negative for back pain and arthralgias.  Skin: Negative for rash.  Allergic/Immunologic: Negative for immunocompromised state.  Hematological: Negative for adenopathy. Does not bruise/bleed easily.  Psychiatric/Behavioral: Negative for suicidal ideas and dysphoric mood.    Objective:  BP 115/74 mmHg  Pulse 88  Temp(Src) 98 F (36.7 C) (Oral)  Resp 18  Ht 5' 2"  (1.575 m)  Wt 150 lb (68.04 kg)  BMI 27.43 kg/m2  SpO2 100%  LMP 01/30/2015  BP/Weight 02/02/2015 12/07/2014 04/19/2692  Systolic BP 854 627 035  Diastolic BP 74 79 82  Wt. (Lbs) 150 151 144  BMI 27.43 27.61 26.33   Physical Exam  Constitutional: She is oriented to person, place, and time. She appears well-developed and well-nourished. No distress.  HENT:  Head: Normocephalic and atraumatic.  Cardiovascular: Normal rate, regular rhythm, normal heart sounds and intact distal pulses.   Pulmonary/Chest: Effort normal and breath sounds normal.  Musculoskeletal: She exhibits no edema.  Neurological: She is alert and oriented to person, place, and time.  Skin: Skin is warm and dry. No rash noted.  Psychiatric:  She has a normal mood and affect.   Lab Results  Component Value Date   HGBA1C 7.20 02/02/2015  CBG 128  Lab Results  Component Value Date   TSH 13.242* 12/07/2014     Assessment & Plan:   Problem List Items Addressed This Visit    Diabetes mellitus type 2, controlled ([place]) - Primary (Chronic)   Relevant Orders   POCT glycosylated hemoglobin (Hb A1C) (Completed)   POCT glucose (manual entry) (Completed)   Graves disease (Chronic)   Relevant Orders   TSH   T3, Free   T4, free    Other Visit Diagnoses     Healthcare maintenance        Relevant Orders    Flu Vaccine QUAD 36+ mos IM (Completed)       No orders of the defined types were placed in this encounter.    Follow-up: No Follow-up on file.   Boykin Nearing MD

## 2015-02-03 LAB — TSH: TSH: 2.493 u[IU]/mL (ref 0.350–4.500)

## 2015-02-03 LAB — T4, FREE: FREE T4: 0.84 ng/dL (ref 0.80–1.80)

## 2015-02-03 LAB — T3, FREE: T3 FREE: 2.7 pg/mL (ref 2.3–4.2)

## 2015-02-06 ENCOUNTER — Ambulatory Visit: Payer: Self-pay | Admitting: Family Medicine

## 2015-02-07 ENCOUNTER — Telehealth: Payer: Self-pay | Admitting: *Deleted

## 2015-02-07 NOTE — Telephone Encounter (Signed)
-----   Message from Enobong Amao, MD sent at 02/05/2015 10:03 AM EDT ----- Please inform the patient that labs are normal. Thank you. 

## 2015-02-07 NOTE — Telephone Encounter (Signed)
Unable to contact pt  Not accepting incoming calls

## 2015-05-01 ENCOUNTER — Encounter: Payer: Self-pay | Admitting: Family Medicine

## 2015-05-01 ENCOUNTER — Ambulatory Visit: Payer: Self-pay | Attending: Family Medicine | Admitting: Family Medicine

## 2015-05-01 ENCOUNTER — Other Ambulatory Visit: Payer: Self-pay | Admitting: Pharmacist

## 2015-05-01 VITALS — BP 125/71 | HR 85 | Temp 98.3°F | Resp 16 | Ht 62.0 in | Wt 157.0 lb

## 2015-05-01 DIAGNOSIS — E119 Type 2 diabetes mellitus without complications: Secondary | ICD-10-CM

## 2015-05-01 DIAGNOSIS — Z9119 Patient's noncompliance with other medical treatment and regimen: Secondary | ICD-10-CM | POA: Insufficient documentation

## 2015-05-01 DIAGNOSIS — E0500 Thyrotoxicosis with diffuse goiter without thyrotoxic crisis or storm: Secondary | ICD-10-CM | POA: Insufficient documentation

## 2015-05-01 DIAGNOSIS — Z794 Long term (current) use of insulin: Secondary | ICD-10-CM | POA: Insufficient documentation

## 2015-05-01 DIAGNOSIS — Z7984 Long term (current) use of oral hypoglycemic drugs: Secondary | ICD-10-CM | POA: Insufficient documentation

## 2015-05-01 DIAGNOSIS — E1165 Type 2 diabetes mellitus with hyperglycemia: Secondary | ICD-10-CM | POA: Insufficient documentation

## 2015-05-01 DIAGNOSIS — R748 Abnormal levels of other serum enzymes: Secondary | ICD-10-CM

## 2015-05-01 DIAGNOSIS — E875 Hyperkalemia: Secondary | ICD-10-CM

## 2015-05-01 LAB — T3, FREE: T3 FREE: 2.5 pg/mL (ref 2.3–4.2)

## 2015-05-01 LAB — COMPLETE METABOLIC PANEL WITH GFR
ALBUMIN: 4.5 g/dL (ref 3.6–5.1)
ALK PHOS: 83 U/L (ref 33–115)
ALT: 11 U/L (ref 6–29)
AST: 11 U/L (ref 10–30)
BILIRUBIN TOTAL: 0.3 mg/dL (ref 0.2–1.2)
BUN: 12 mg/dL (ref 7–25)
CALCIUM: 9.6 mg/dL (ref 8.6–10.2)
CO2: 20 mmol/L (ref 20–31)
CREATININE: 0.69 mg/dL (ref 0.50–1.10)
Chloride: 102 mmol/L (ref 98–110)
GFR, Est Non African American: >89 mL/min (ref >=60)
Glucose, Bld: 247 mg/dL — ABNORMAL HIGH (ref 65–99)
Potassium: 5.1 mmol/L (ref 3.5–5.3)
Sodium: 135 mmol/L (ref 135–146)
TOTAL PROTEIN: 7 g/dL (ref 6.1–8.1)

## 2015-05-01 LAB — GLUCOSE, POCT (MANUAL RESULT ENTRY): POC Glucose: 291 mg/dl — AB (ref 70–99)

## 2015-05-01 LAB — POCT GLYCOSYLATED HEMOGLOBIN (HGB A1C): HEMOGLOBIN A1C: 8.4

## 2015-05-01 LAB — TSH: TSH: 3.294 u[IU]/mL (ref 0.350–4.500)

## 2015-05-01 LAB — T4, FREE: Free T4: 0.95 ng/dL (ref 0.80–1.80)

## 2015-05-01 MED ORDER — METFORMIN HCL ER (OSM) 1000 MG PO TB24: 2000.0000 mg | ORAL_TABLET | Freq: Every day | ORAL | Status: DC

## 2015-05-01 MED ORDER — ATENOLOL 25 MG PO TABS: 12.5000 mg | ORAL_TABLET | Freq: Every day | ORAL | Status: DC

## 2015-05-01 MED ORDER — METFORMIN HCL ER 500 MG PO TB24: ORAL_TABLET | ORAL | Status: DC

## 2015-05-01 NOTE — Assessment & Plan Note (Addendum)
A; euthyroid on exam P: TFTts today Taper down on atenolol from 25 to 12.5 mg daily

## 2015-05-01 NOTE — Progress Notes (Signed)
F/U DM  Taking medication as prescribed  Glucose running 130-150 No tobacco user  No pain today  No suicidal thought in the past two weeks

## 2015-05-01 NOTE — Patient Instructions (Addendum)
Rachael Russo was seen today for diabetes and graves' disease.  Diagnoses and all orders for this visit:  Controlled type 2 diabetes mellitus without complication, unspecified long term insulin use status (HCC) -     POCT glycosylated hemoglobin (Hb A1C) -     POCT glucose (manual entry)  Graves disease -     TSH -     T3, Free -     T4, free -     atenolol (TENORMIN) 25 MG tablet; Take 0.5 tablets (12.5 mg total) by mouth daily.  Serum potassium elevated -     COMPLETE METABOLIC PANEL WITH GFR  Serum calcium elevated -     COMPLETE METABOLIC PANEL WITH GFR  Elevated serum alkaline phosphatase level -     COMPLETE METABOLIC PANEL WITH GFR  Uncontrolled type 2 diabetes mellitus with hyperglycemia, with long-term current use of insulin (HCC) -     metformin (FORTAMET) 1000 MG (OSM) 24 hr tablet; Take 2 tablets (2,000 mg total) by mouth daily after supper.  increase metformin to 2000 mg daily after supper  Heart rate  is normal now that thyroid function has been normal, decrease atenolol from 25 to 12.5 mg daily   Diabetes blood sugar goals  Fasting (in AM before breakfast, 8 hrs of no eating or drinking (except water or unsweetened coffee or tea): 90-110 2 hrs after meals: < 160,   No low sugars: nothing < 70   F/u in 3 months for diabetes and Grave's disease   Dr. Armen Pickup

## 2015-05-01 NOTE — Assessment & Plan Note (Signed)
A: decline in control in setting of weight gain and non compliance with low carb diet P: Decrease carbs Increase exercise Increase metformin from 1000 mg daily to 2000 mg daily Keep novolin 70/30 at 6 U BID

## 2015-05-01 NOTE — Progress Notes (Signed)
Patient ID: Rachael Russo, female   DOB: 10-19-1975, 40 y.o.   MRN: [id number]   Subjective:  Patient ID: Rachael Russo, female    DOB: Nov 10, 1975  Age: 40 y.o. MRN: [id number]  CC: Diabetes  Spanish interpreter used   HPI Rachael Russo presents for   1. CHRONIC DIABETES  Disease Monitoring  Blood Sugar Ranges: 162 fasting   Polyuria: no   Visual problems: no   Medication Compliance: yes  Medication Side Effects  Hypoglycemia: no   Preventitive Health Care  Eye Exam: done   Foot Exam: not due   Diet pattern: not being careful with carbs   Exercise: no   2. Grave's disease: taking methimazole 15 mg daily. Taking atenolol 25 mg daily. No HA, CP, palpitations or anxiety, no tremor.    Social History  Substance Use Topics  . Smoking status: Never Smoker   . Smokeless tobacco: Not on file  . Alcohol Use: No   Outpatient Prescriptions Prior to Visit  Medication Sig Dispense Refill  . atenolol (TENORMIN) 25 MG tablet Take 1 tablet (25 mg total) by mouth daily. 30 tablet 5  . Blood Glucose Monitoring Suppl (TRUERESULT BLOOD GLUCOSE) W/DEVICE KIT 1 each by Does not apply route 2 (two) times daily before a meal. 1 each 0  . glucose blood (TRUETEST TEST) test strip 1 each by Other route 3 (three) times daily. Use as instructed 100 each 12  . insulin NPH-regular Human (NOVOLIN 70/30) (70-30) 100 UNIT/ML injection Inject 6 Units into the skin 2 (two) times daily with a meal. 10 mL 5  . metFORMIN (GLUCOPHAGE XR) 500 MG 24 hr tablet Take 2 tablets (1,000 mg total) by mouth daily after supper. 60 tablet 5  . methimazole (TAPAZOLE) 10 MG tablet Take 1.5 tablets (15 mg total) by mouth daily. 60 tablet 5  . TRUEPLUS LANCETS 28G MISC 1 each by Does not apply route 2 (two) times daily. 100 each 12  . Prenatal Vit-Fe Fumarate-FA (PRENATAL MULTIVITAMIN) TABS tablet Take 1 tablet by mouth daily. (Patient not taking: Reported on 05/01/2015) 30 tablet 2   No facility-administered medications prior to  visit.    ROS Review of Systems  Constitutional: Negative for fever and chills.  Eyes: Negative for visual disturbance.  Respiratory: Negative for shortness of breath.   Cardiovascular: Negative for chest pain.  Gastrointestinal: Negative for abdominal pain and blood in stool.  Musculoskeletal: Negative for back pain and arthralgias.  Skin: Negative for rash.  Allergic/Immunologic: Negative for immunocompromised state.  Hematological: Negative for adenopathy. Does not bruise/bleed easily.  Psychiatric/Behavioral: Negative for suicidal ideas and dysphoric mood.    Objective:  BP 125/71 mmHg  Pulse 85  Temp(Src) 98.3 F (36.8 C) (Oral)  Resp 16  Ht 5' 2"  (1.575 m)  Wt 157 lb (71.215 kg)  BMI 28.71 kg/m2  SpO2 96%  LMP 04/13/2015 Pulse Readings from Last 3 Encounters:  05/01/15 85  02/02/15 88  12/07/14 70    BP/Weight 05/01/2015 02/02/2015 9/56/3875  Systolic BP 643 329 518  Diastolic BP 71 74 79  Wt. (Lbs) 157 150 151  BMI 28.71 27.43 27.61    Physical Exam  Constitutional: She is oriented to person, place, and time. She appears well-developed and well-nourished. No distress.  HENT:  Head: Normocephalic and atraumatic.  Cardiovascular: Normal rate, regular rhythm, normal heart sounds and intact distal pulses.   Pulmonary/Chest: Effort normal and breath sounds normal.  Musculoskeletal: She exhibits no edema.  Neurological: She is alert and  oriented to person, place, and time.  Skin: Skin is warm and dry. No rash noted.  Psychiatric: She has a normal mood and affect.   Lab Results  Component Value Date   HGBA1C 8.40 05/01/2015   CBG 291 Assessment & Plan:   Problem List Items Addressed This Visit    Diabetes mellitus type 2, controlled ([place]) - Primary (Chronic)    A: decline in control in setting of weight gain and non compliance with low carb diet P: Decrease carbs Increase exercise Increase metformin from 1000 mg daily to 2000 mg daily Keep novolin  70/30 at 6 U BID       Relevant Medications   metformin (FORTAMET) 1000 MG (OSM) 24 hr tablet   Other Relevant Orders   POCT glycosylated hemoglobin (Hb A1C) (Completed)   POCT glucose (manual entry) (Completed)   Elevated serum alkaline phosphatase level   Relevant Orders   COMPLETE METABOLIC PANEL WITH GFR   Graves disease (Chronic)    A; euthyroid on exam P: TFTts today Taper down on atenolol from 25 to 12.5 mg daily       Relevant Medications   atenolol (TENORMIN) 25 MG tablet   Other Relevant Orders   TSH   T3, Free   T4, free   Serum calcium elevated   Relevant Orders   COMPLETE METABOLIC PANEL WITH GFR   Serum potassium elevated   Relevant Orders   COMPLETE METABOLIC PANEL WITH GFR    Other Visit Diagnoses    Uncontrolled type 2 diabetes mellitus with hyperglycemia, with long-term current use of insulin (HCC)        Relevant Medications    metformin (FORTAMET) 1000 MG (OSM) 24 hr tablet       No orders of the defined types were placed in this encounter.    Follow-up: No Follow-up on file.   Boykin Nearing MD

## 2015-05-02 ENCOUNTER — Telehealth: Payer: Self-pay | Admitting: *Deleted

## 2015-05-02 NOTE — Telephone Encounter (Signed)
-----   Message from Dessa Phi, MD sent at 05/01/2015 10:16 PM EST ----- Thyroid function test normal  CMP normal except for elevated blood sugar,  Continue current care plan emphasizing low carb and low sugar  diet and increased exercise

## 2015-05-02 NOTE — Telephone Encounter (Signed)
Date of birth verified by pt  Lab results given  Continue taking medication as prescribed  Maintain a low carb and low sugar diet increased exercise Pt verbalized understanding  Information given in Spanish

## 2015-05-14 ENCOUNTER — Other Ambulatory Visit: Payer: Self-pay | Admitting: Family Medicine

## 2015-07-26 ENCOUNTER — Ambulatory Visit: Payer: Self-pay | Attending: Family Medicine | Admitting: Family Medicine

## 2015-07-26 ENCOUNTER — Encounter: Payer: Self-pay | Admitting: Family Medicine

## 2015-07-26 VITALS — BP 116/79 | HR 90 | Temp 98.1°F | Resp 16 | Ht 62.0 in | Wt 154.0 lb

## 2015-07-26 DIAGNOSIS — E0500 Thyrotoxicosis with diffuse goiter without thyrotoxic crisis or storm: Secondary | ICD-10-CM | POA: Insufficient documentation

## 2015-07-26 DIAGNOSIS — Z7984 Long term (current) use of oral hypoglycemic drugs: Secondary | ICD-10-CM | POA: Insufficient documentation

## 2015-07-26 DIAGNOSIS — IMO0001 Reserved for inherently not codable concepts without codable children: Secondary | ICD-10-CM | POA: Insufficient documentation

## 2015-07-26 DIAGNOSIS — E1165 Type 2 diabetes mellitus with hyperglycemia: Secondary | ICD-10-CM | POA: Insufficient documentation

## 2015-07-26 DIAGNOSIS — Z794 Long term (current) use of insulin: Secondary | ICD-10-CM | POA: Insufficient documentation

## 2015-07-26 LAB — GLUCOSE, POCT (MANUAL RESULT ENTRY): POC GLUCOSE: 210 mg/dL — AB (ref 70–99)

## 2015-07-26 LAB — POCT GLYCOSYLATED HEMOGLOBIN (HGB A1C): HEMOGLOBIN A1C: 8.5

## 2015-07-26 MED ORDER — GLUCOSE BLOOD VI STRP: 1.0000 | ORAL_STRIP | Freq: Three times a day (TID) | Status: DC

## 2015-07-26 MED ORDER — TRUE METRIX METER W/DEVICE KIT: 1.0000 | PACK | Freq: Three times a day (TID) | Status: DC

## 2015-07-26 MED ORDER — INSULIN NPH ISOPHANE & REGULAR (70-30) 100 UNIT/ML ~~LOC~~ SUSP: 10.0000 [IU] | Freq: Two times a day (BID) | SUBCUTANEOUS | Status: DC

## 2015-07-26 MED ORDER — TRUEPLUS LANCETS 28G MISC: 1.0000 | Freq: Two times a day (BID) | Status: DC

## 2015-07-26 MED ORDER — METFORMIN HCL ER 500 MG PO TB24: ORAL_TABLET | ORAL | Status: DC

## 2015-07-26 MED ORDER — METHIMAZOLE 10 MG PO TABS: 15.0000 mg | ORAL_TABLET | Freq: Every day | ORAL | Status: DC

## 2015-07-26 NOTE — Patient Instructions (Addendum)
Rachael Russo was seen today for diabetes.  Diagnoses and all orders for this visit:  Uncontrolled type 2 diabetes mellitus without complication, with long-term current use of insulin (HCC) -     POCT glycosylated hemoglobin (Hb A1C) -     POCT glucose (manual entry) -     Blood Glucose Monitoring Suppl (TRUE METRIX METER) w/Device KIT; 1 each by Does not apply route 3 (three) times daily. -     insulin NPH-regular Human (NOVOLIN 70/30) (70-30) 100 UNIT/ML injection; Inject 10 Units into the skin 2 (two) times daily with a meal. -     metFORMIN (GLUCOPHAGE XR) 500 MG 24 hr tablet; Take 4 tablets (2,000 mg total) by mouth daily after supper -     glucose blood (TRUETEST TEST) test strip; 1 each by Other route 3 (three) times daily. Use as instructed -     TRUEPLUS LANCETS 28G MISC; 1 each by Does not apply route 2 (two) times daily.  Graves disease -     methimazole (TAPAZOLE) 10 MG tablet; Take 1.5 tablets (15 mg total) by mouth daily.    F/u in 3 months for diabetes and Grave's disease  Dr. Adrian Blackwater

## 2015-07-26 NOTE — Progress Notes (Signed)
F/U DM  Stated not checking glucose at home due to old glucometer  Glucometer order.  Requesting medicine refills No pain today  No suicidal thoughts two weeks

## 2015-07-26 NOTE — Assessment & Plan Note (Signed)
A: previously controlled diabetes is not uncontrolled for past 3 months  P: Refilled testing supplies Increase novolin 70/30 to 10 U BID Continue metformin 2000 mg daily

## 2015-07-26 NOTE — Assessment & Plan Note (Signed)
euthyroid on exam Continue methimazole 15 mg daily D/c atenolol as she is not HTN or tachycardic  F/u in 3 months, TFT at 3 month follow up

## 2015-07-26 NOTE — Progress Notes (Signed)
Patient ID: Rachael Russo, female   DOB: [birth year], 40 y.o.   MRN: [id number]   Subjective:  Patient ID: Rachael Russo, female    DOB: 1975-11-28  Age: 39 y.o. MRN: [id number]  CC: Diabetes  Spanish interpreter used   HPI Rachael Russo presents for   1. CHRONIC DIABETES  Disease Monitoring  Blood Sugar Ranges: not checking, needs a new meter   Polyuria: no   Visual problems: no   Medication Compliance: yes  Medication Side Effects  Hypoglycemia: no   Preventitive Health Care  Eye Exam: done   Foot Exam: not due   Diet pattern: not being careful with carbs   Exercise: no   2. Grave's disease: taking methimazole 15 mg daily. Taking atenolol 25 mg daily, but ran out 2 days ago.  No HA, CP, palpitations or anxiety, no tremor.    Social History  Substance Use Topics  . Smoking status: Never Smoker   . Smokeless tobacco: Not on file  . Alcohol Use: No   Outpatient Prescriptions Prior to Visit  Medication Sig Dispense Refill  . atenolol (TENORMIN) 25 MG tablet Take 0.5 tablets (12.5 mg total) by mouth daily. 30 tablet 0  . Blood Glucose Monitoring Suppl (TRUERESULT BLOOD GLUCOSE) W/DEVICE KIT 1 each by Does not apply route 2 (two) times daily before a meal. 1 each 0  . glucose blood (TRUETEST TEST) test strip 1 each by Other route 3 (three) times daily. Use as instructed 100 each 12  . insulin NPH-regular Human (NOVOLIN 70/30) (70-30) 100 UNIT/ML injection Inject 6 Units into the skin 2 (two) times daily with a meal. 10 mL 5  . metFORMIN (GLUCOPHAGE XR) 500 MG 24 hr tablet Take 4 tablets (2,000 mg total) by mouth daily after supper 120 tablet 5  . methimazole (TAPAZOLE) 10 MG tablet Take 1.5 tablets (15 mg total) by mouth daily. 60 tablet 5  . Prenatal Vit-Fe Fumarate-FA (PRENATAL MULTIVITAMIN) TABS tablet Take 1 tablet by mouth daily. (Patient not taking: Reported on 05/01/2015) 30 tablet 2  . TRUEPLUS LANCETS 28G MISC 1 each by Does not apply route 2 (two) times daily. 100 each 12    No facility-administered medications prior to visit.    ROS Review of Systems  Constitutional: Negative for fever and chills.  Eyes: Negative for visual disturbance.  Respiratory: Negative for shortness of breath.   Cardiovascular: Negative for chest pain.  Gastrointestinal: Negative for abdominal pain and blood in stool.  Musculoskeletal: Negative for back pain and arthralgias.  Skin: Negative for rash.  Allergic/Immunologic: Negative for immunocompromised state.  Hematological: Negative for adenopathy. Does not bruise/bleed easily.  Psychiatric/Behavioral: Negative for suicidal ideas and dysphoric mood.    Objective:  BP 116/79 mmHg  Pulse 90  Temp(Src) 98.1 F (36.7 C) (Oral)  Resp 16  Ht _0  (1.575 m)  Wt 154 lb (69.854 kg)  BMI 28.16 kg/m2  SpO2 100%  LMP 07/06/2015 Pulse Readings from Last 3 Encounters:  07/26/15 90  05/01/15 85  02/02/15 88    BP/Weight 07/26/2015 05/01/2015 49/82/6415  Systolic BP 830 940 768  Diastolic BP 79 71 74  Wt. (Lbs) 154 157 150  BMI 28.16 28.71 27.43    Physical Exam  Constitutional: She is oriented to person, place, and time. She appears well-developed and well-nourished. No distress.  HENT:  Head: Normocephalic and atraumatic.  Cardiovascular: Normal rate, regular rhythm, normal heart sounds and intact distal pulses.   Pulmonary/Chest: Effort normal and breath sounds normal.  Musculoskeletal: She exhibits no edema.  Neurological: She is alert and oriented to person, place, and time.  Skin: Skin is warm and dry. No rash noted.  Psychiatric: She has a normal mood and affect.   Lab Results  Component Value Date   HGBA1C 8.40 05/01/2015   Lab Results  Component Value Date   HGBA1C 8.5 07/26/2015    CBG 210 Assessment & Plan:   Problem List Items Addressed This Visit    Uncontrolled type 2 diabetes mellitus without complication, with long-term current use of insulin ([place]) - Primary   Relevant Medications   Blood  Glucose Monitoring Suppl (TRUE METRIX METER) w/Device KIT   insulin NPH-regular Human (NOVOLIN 70/30) (70-30) 100 UNIT/ML injection   metFORMIN (GLUCOPHAGE XR) 500 MG 24 hr tablet   TRUEPLUS LANCETS 28G MISC   glucose blood (TRUE METRIX BLOOD GLUCOSE TEST) test strip   Other Relevant Orders   POCT glycosylated hemoglobin (Hb A1C) (Completed)   POCT glucose (manual entry) (Completed)   Graves disease (Chronic)   Relevant Medications   methimazole (TAPAZOLE) 10 MG tablet      No orders of the defined types were placed in this encounter.    Follow-up: No Follow-up on file.   Boykin Nearing MD

## 2015-10-30 ENCOUNTER — Ambulatory Visit: Payer: Self-pay | Attending: Family Medicine | Admitting: Family Medicine

## 2015-10-30 ENCOUNTER — Encounter: Payer: Self-pay | Admitting: Family Medicine

## 2015-10-30 VITALS — BP 128/84 | HR 92 | Temp 99.0°F | Ht 62.0 in | Wt 155.0 lb

## 2015-10-30 DIAGNOSIS — Z794 Long term (current) use of insulin: Secondary | ICD-10-CM

## 2015-10-30 DIAGNOSIS — E1165 Type 2 diabetes mellitus with hyperglycemia: Secondary | ICD-10-CM

## 2015-10-30 DIAGNOSIS — IMO0001 Reserved for inherently not codable concepts without codable children: Secondary | ICD-10-CM

## 2015-10-30 DIAGNOSIS — E0500 Thyrotoxicosis with diffuse goiter without thyrotoxic crisis or storm: Secondary | ICD-10-CM

## 2015-10-30 LAB — POCT GLYCOSYLATED HEMOGLOBIN (HGB A1C): HEMOGLOBIN A1C: 7.4

## 2015-10-30 LAB — GLUCOSE, POCT (MANUAL RESULT ENTRY): POC GLUCOSE: 188 mg/dL — AB (ref 70–99)

## 2015-10-30 MED ORDER — INSULIN NPH ISOPHANE & REGULAR (70-30) 100 UNIT/ML ~~LOC~~ SUSP: 13.0000 [IU] | Freq: Two times a day (BID) | SUBCUTANEOUS | Status: DC

## 2015-10-30 MED ORDER — ATENOLOL 25 MG PO TABS: 12.5000 mg | ORAL_TABLET | Freq: Every day | ORAL | Status: DC

## 2015-10-30 NOTE — Assessment & Plan Note (Signed)
Improved with A1c down 1 % point Plan Increase novolin 70/30 to 13 U BIS

## 2015-10-30 NOTE — Patient Instructions (Addendum)
Rachael Russo was seen today for diabetes and graves disease.  Diagnoses and all orders for this visit:  Uncontrolled type 2 diabetes mellitus without complication, with long-term current use of insulin (HCC) -     HgB A1c -     Glucose (CBG) -     Microalbumin/Creatinine Ratio, Urine  Graves disease -     atenolol (TENORMIN) 25 MG tablet; Take 0.5 tablets (12.5 mg total) by mouth daily. -     TSH -     T3, Free -     T4, free   Improved A1c increase insulin to 13 U twice daily   Diabetes blood sugar goals  Fasting (in AM before breakfast, 8 hrs of no eating or drinking (except water or unsweetened coffee or tea): 90-110 2 hrs after meals: < 160,   No low sugars: nothing < 70   F/u in 3 months for diabetes and Grave's disease   Dr. Armen PickupFunches

## 2015-10-30 NOTE — Progress Notes (Signed)
Patient ID: Rachael Russo, female   DOB: [birth year], 40 y.o.   MRN: [id number]   Subjective:  Patient ID: Rachael Russo, female    DOB: July 23, 1975  Age: 40 y.o. MRN: [id number]  CC: Diabetes and Graves Disease  Spanish interpreter used   HPI Raylea Adcox has diabetes and Grave's disease she presents with her youngest child Dorian  for   1. CHRONIC DIABETES  Disease Monitoring  Blood Sugar Ranges:   Fasting: 96-130   Post prandial: 175-180   Polyuria: no   Visual problems: no   Medication Compliance: yes  Medication Side Effects  Hypoglycemia: no   Preventitive Health Care  Eye Exam: done   Foot Exam: not due   Diet pattern: not being careful with carbs   Exercise: no   2. Grave's disease: taking methimazole 15 mg daily. No HA, CP, palpitations or anxiety, no tremor.    Social History  Substance Use Topics  . Smoking status: Never Smoker   . Smokeless tobacco: Not on file  . Alcohol Use: No   Outpatient Prescriptions Prior to Visit  Medication Sig Dispense Refill  . Blood Glucose Monitoring Suppl (TRUE METRIX METER) w/Device KIT 1 each by Does not apply route 3 (three) times daily. 1 kit 0  . glucose blood (TRUE METRIX BLOOD GLUCOSE TEST) test strip 1 each by Other route 3 (three) times daily. 100 each 11  . insulin NPH-regular Human (NOVOLIN 70/30) (70-30) 100 UNIT/ML injection Inject 10 Units into the skin 2 (two) times daily with a meal. 10 mL 5  . metFORMIN (GLUCOPHAGE XR) 500 MG 24 hr tablet Take 4 tablets (2,000 mg total) by mouth daily after supper 120 tablet 5  . methimazole (TAPAZOLE) 10 MG tablet Take 1.5 tablets (15 mg total) by mouth daily. 45 tablet 11  . Prenatal Vit-Fe Fumarate-FA (PRENATAL MULTIVITAMIN) TABS tablet Take 1 tablet by mouth daily. 30 tablet 2  . TRUEPLUS LANCETS 28G MISC 1 each by Does not apply route 2 (two) times daily. 100 each 12   No facility-administered medications prior to visit.    ROS Review of Systems  Constitutional: Negative  for fever and chills.  Eyes: Negative for visual disturbance.  Respiratory: Negative for shortness of breath.   Cardiovascular: Negative for chest pain.  Gastrointestinal: Negative for abdominal pain and blood in stool.  Musculoskeletal: Negative for back pain and arthralgias.  Skin: Negative for rash.  Allergic/Immunologic: Negative for immunocompromised state.  Hematological: Negative for adenopathy. Does not bruise/bleed easily.  Psychiatric/Behavioral: Negative for suicidal ideas and dysphoric mood.    Objective:  BP 128/84 mmHg  Pulse 92  Temp(Src) 99 F (37.2 C) (Oral)  Ht _0  (1.575 m)  Wt 155 lb (70.308 kg)  BMI 28.34 kg/m2  SpO2 99%  LMP 10/25/2015 (Exact Date) Pulse Readings from Last 3 Encounters:  10/30/15 92  07/26/15 90  05/01/15 85    BP/Weight 10/30/2015 07/26/2015 0/17/5102  Systolic BP 585 277 824  Diastolic BP 84 79 71  Wt. (Lbs) 155 154 157  BMI 28.34 28.16 28.71    Physical Exam  Constitutional: She is oriented to person, place, and time. She appears well-developed and well-nourished. No distress.  HENT:  Head: Normocephalic and atraumatic.  Cardiovascular: Normal rate, regular rhythm, normal heart sounds and intact distal pulses.   Pulmonary/Chest: Effort normal and breath sounds normal.  Musculoskeletal: She exhibits no edema.  Neurological: She is alert and oriented to person, place, and time.  Skin: Skin is warm and  dry. No rash noted.  Psychiatric: She has a normal mood and affect.   Lab Results  Component Value Date   HGBA1C 8.5 07/26/2015   Lab Results  Component Value Date   HGBA1C 7.4 10/30/2015   CBG 188 Assessment & Plan:   Problem List Items Addressed This Visit    Uncontrolled type 2 diabetes mellitus without complication, with long-term current use of insulin (HCC) - Primary (Chronic)    Improved with A1c down 1 % point Plan Increase novolin 70/30 to 13 U BIS        Relevant Medications   insulin NPH-regular Human  (NOVOLIN 70/30) (70-30) 100 UNIT/ML injection   Other Relevant Orders   HgB A1c (Completed)   Glucose (CBG) (Completed)   Microalbumin/Creatinine Ratio, Urine   Graves disease (Chronic)   Relevant Medications   atenolol (TENORMIN) 25 MG tablet   Other Relevant Orders   TSH   T3, Free   T4, free      No orders of the defined types were placed in this encounter.    Follow-up: Return in about 3 months (around 01/30/2016) for diabetes and Grave's disease .   Boykin Nearing MD

## 2015-10-30 NOTE — Assessment & Plan Note (Signed)
Euthyroid on exam Plan: TFTs Continue methimazole 15 mg TID Add back atenolol at 12.5 mg daily due to slight elevation in HR and elevated BP on initial check

## 2015-10-31 LAB — MICROALBUMIN / CREATININE URINE RATIO
Creatinine, Urine: 41 mg/dL (ref 20–320)
MICROALB UR: 0.5 mg/dL
MICROALB/CREAT RATIO: 12 ug/mg{creat} (ref <30)

## 2015-11-07 ENCOUNTER — Telehealth: Payer: Self-pay

## 2015-11-07 NOTE — Telephone Encounter (Signed)
[employer] Jesus Id# [phone] contacted pt to go over lab results pt is aware of results and doesn't or concerns.

## 2015-12-04 ENCOUNTER — Other Ambulatory Visit: Payer: Self-pay | Admitting: Family Medicine

## 2015-12-04 DIAGNOSIS — E0500 Thyrotoxicosis with diffuse goiter without thyrotoxic crisis or storm: Secondary | ICD-10-CM

## 2015-12-04 DIAGNOSIS — Z789 Other specified health status: Secondary | ICD-10-CM

## 2015-12-04 MED ORDER — METOPROLOL SUCCINATE ER 25 MG PO TB24: 25.0000 mg | ORAL_TABLET | Freq: Every day | ORAL | 3 refills | Status: DC

## 2016-02-01 ENCOUNTER — Encounter: Payer: Self-pay | Admitting: Family Medicine

## 2016-02-01 ENCOUNTER — Ambulatory Visit: Payer: Self-pay | Attending: Family Medicine | Admitting: Family Medicine

## 2016-02-01 VITALS — BP 128/81 | HR 90 | Temp 98.7°F | Ht 62.0 in | Wt 155.0 lb

## 2016-02-01 DIAGNOSIS — E1165 Type 2 diabetes mellitus with hyperglycemia: Secondary | ICD-10-CM | POA: Insufficient documentation

## 2016-02-01 DIAGNOSIS — Z794 Long term (current) use of insulin: Secondary | ICD-10-CM | POA: Insufficient documentation

## 2016-02-01 DIAGNOSIS — Z23 Encounter for immunization: Secondary | ICD-10-CM

## 2016-02-01 DIAGNOSIS — IMO0001 Reserved for inherently not codable concepts without codable children: Secondary | ICD-10-CM

## 2016-02-01 DIAGNOSIS — E0500 Thyrotoxicosis with diffuse goiter without thyrotoxic crisis or storm: Secondary | ICD-10-CM | POA: Insufficient documentation

## 2016-02-01 LAB — GLUCOSE, POCT (MANUAL RESULT ENTRY): POC GLUCOSE: 137 mg/dL — AB (ref 70–99)

## 2016-02-01 LAB — POCT GLYCOSYLATED HEMOGLOBIN (HGB A1C): HEMOGLOBIN A1C: 7.5

## 2016-02-01 MED ORDER — GLUCOSE BLOOD VI STRP: 1.0000 | ORAL_STRIP | Freq: Three times a day (TID) | 11 refills | Status: DC

## 2016-02-01 MED ORDER — METFORMIN HCL ER 500 MG PO TB24: ORAL_TABLET | ORAL | 5 refills | Status: DC

## 2016-02-01 MED ORDER — INSULIN NPH ISOPHANE & REGULAR (70-30) 100 UNIT/ML ~~LOC~~ SUSP: 15.0000 [IU] | Freq: Two times a day (BID) | SUBCUTANEOUS | 5 refills | Status: DC

## 2016-02-01 NOTE — Progress Notes (Signed)
Patient ID: Rachael Russo, female   DOB: [birth year], 40 y.o.   MRN: [id number]   Subjective:  Patient ID: Rachael Russo, female    DOB: 04/05/1976  Age: 40 y.o. MRN: [id number]  CC: Diabetes  Spanish interpreter used   HPI Rachael Russo has diabetes and Grave's disease she presents for f/u   1. CHRONIC DIABETES  Disease Monitoring  Blood Sugar Ranges:   Fasting: not checking   Post prandial: not checking   Polyuria: no   Visual problems: no   Medication Compliance: yes  Medication Side Effects  Hypoglycemia: subjective lows, feels faint at times. Last felt this way yesterday afternoon   [place] Exam: done   Foot Exam: not due   Diet pattern: not being careful with carbs   Exercise: no   2. Grave's disease: taking methimazole 15 mg daily. No HA, CP, palpitations or anxiety, no tremor.   Past Surgical History:  Procedure Laterality Date  . CESAREAN SECTION    . CESAREAN SECTION N/A 12/03/2013   Procedure: CESAREAN SECTION;  Surgeon: [name], MD;  Location: [facility];  Service: Obstetrics;  Laterality: N/A;   Social History  Substance Use Topics  . Smoking status: Never Smoker  . Smokeless tobacco: Not on file  . Alcohol use No   Outpatient Medications Prior to Visit  Medication Sig Dispense Refill  . Blood Glucose Monitoring Suppl (TRUE METRIX METER) w/Device KIT 1 each by Does not apply route 3 (three) times daily. 1 kit 0  . glucose blood (TRUE METRIX BLOOD GLUCOSE TEST) test strip 1 each by Other route 3 (three) times daily. 100 each 11  . glucose blood test strip Check blood sugar 2x/day  11  . insulin NPH-regular Human (NOVOLIN 70/30) (70-30) 100 UNIT/ML injection Inject 13 Units into the skin 2 (two) times daily with a meal. 10 mL 5  . metFORMIN (GLUCOPHAGE XR) 500 MG 24 hr tablet Take 4 tablets (2,000 mg total) by mouth daily after supper 120 tablet 5  . methimazole (TAPAZOLE) 10 MG tablet Take 1.5 tablets (15 mg total) by mouth daily. 45  tablet 11  . metoprolol succinate (TOPROL-XL) 25 MG 24 hr tablet Take 1 tablet (25 mg total) by mouth daily. 90 tablet 3  . Prenatal Vit-Fe Fumarate-FA (PRENATAL MULTIVITAMIN) TABS tablet Take 1 tablet by mouth daily. 30 tablet 2  . TRUEPLUS LANCETS 28G MISC 1 each by Does not apply route 2 (two) times daily. 100 each 12   No facility-administered medications prior to visit.     ROS Review of Systems  Constitutional: Negative for chills and fever.  Eyes: Negative for visual disturbance.  Respiratory: Negative for shortness of breath.   Cardiovascular: Negative for chest pain.  Gastrointestinal: Negative for abdominal pain and blood in stool.  Musculoskeletal: Negative for arthralgias and back pain.  Skin: Negative for rash.  Allergic/Immunologic: Negative for immunocompromised state.  Hematological: Negative for adenopathy. Does not bruise/bleed easily.  Psychiatric/Behavioral: Negative for dysphoric mood and suicidal ideas.    Objective:  BP 128/81 (BP Location: Right Arm, Patient Position: Sitting, Cuff Size: Small)   Pulse 90   Temp 98.7 F (37.1 C) (Oral)   Ht 5' 2"  (1.575 m)   Wt 155 lb (70.3 kg)   LMP 12/23/2015   SpO2 100%   BMI 28.35 kg/m  Pulse Readings from Last 3 Encounters:  02/01/16 90  10/30/15 92  07/26/15 90    BP/Weight 02/01/2016 10/30/2015 8/84/1660  Systolic BP  648 303 220  Diastolic BP 81 84 79  Wt. (Lbs) 155 155 154  BMI 28.35 28.34 28.16    Physical Exam  Constitutional: She is oriented to person, place, and time. She appears well-developed and well-nourished. No distress.  HENT:  Head: Normocephalic and atraumatic.  Cardiovascular: Normal rate, regular rhythm, normal heart sounds and intact distal pulses.   Pulmonary/Chest: Effort normal and breath sounds normal.  Musculoskeletal: She exhibits no edema.  Neurological: She is alert and oriented to person, place, and time.  Skin: Skin is warm and dry. No rash noted.  Psychiatric: She has a  normal mood and affect.   Lab Results  Component Value Date   HGBA1C 7.4 10/30/2015   A1c 7.5   CBG 137 Assessment & Plan:   Problem List Items Addressed This Visit      High   Uncontrolled type 2 diabetes mellitus without complication, with long-term current use of insulin ([place]) - Primary (Chronic)    A: decline with slight elevation of A1c P:  Increase insulin to 15 U twice daily I have refilled metformin and test strips        Relevant Medications   glucose blood (TRUE METRIX BLOOD GLUCOSE TEST) test strip   metFORMIN (GLUCOPHAGE XR) 500 MG 24 hr tablet   insulin NPH-regular Human (NOVOLIN 70/30) (70-30) 100 UNIT/ML injection   Other Relevant Orders   POCT glucose (manual entry) (Completed)   POCT glycosylated hemoglobin (Hb A1C) (Completed)   BASIC METABOLIC PANEL WITH GFR   Graves disease (Chronic)    A: Graves disease appears euthyroid on exam P: Checking thyroid function test today       Relevant Orders   TSH   T3, Free   T4, free   Lipid Panel    Other Visit Diagnoses   None.     No orders of the defined types were placed in this encounter.   Follow-up: Return in about 3 months (around 05/03/2016) for Grave's disease and diabetes .   Boykin Nearing MD

## 2016-02-01 NOTE — Assessment & Plan Note (Signed)
A: decline with slight elevation of A1c P:  Increase insulin to 15 U twice daily I have refilled metformin and test strips

## 2016-02-01 NOTE — Assessment & Plan Note (Signed)
A: Graves disease appears euthyroid on exam P: Checking thyroid function test today

## 2016-02-01 NOTE — Patient Instructions (Signed)
Rachael Russo was seen today for diabetes.  Diagnoses and all orders for this visit:  Uncontrolled type 2 diabetes mellitus without complication, with long-term current use of insulin (HCC) -     POCT glucose (manual entry) -     POCT glycosylated hemoglobin (Hb A1C) -     glucose blood (TRUE METRIX BLOOD GLUCOSE TEST) test strip; 1 each by Other route 3 (three) times daily. -     metFORMIN (GLUCOPHAGE XR) 500 MG 24 hr tablet; Take 4 tablets (2,000 mg total) by mouth daily after supper -     insulin NPH-regular Human (NOVOLIN 70/30) (70-30) 100 UNIT/ML injection; Inject 15 Units into the skin 2 (two) times daily with a meal.  Graves disease -     TSH -     T3, Free -     T4, free   Your A1c is 7.5 which is a bit higher from last check  Increase insulin to 15 U twice daily I have refilled metformin and test strips  Diabetes blood sugar goals  Fasting (in AM before breakfast, 8 hrs of no eating or drinking (except water or unsweetened coffee or tea): 90-110 2 hrs after meals: < 160,   No low sugars: nothing < 70    Checking thyroid function test today   F/u in 3 months for diabetes   Dr. Armen PickupFunches

## 2016-02-02 LAB — BASIC METABOLIC PANEL WITH GFR
BUN: 9 mg/dL (ref 7–25)
CALCIUM: 10.2 mg/dL (ref 8.6–10.2)
CO2: 25 mmol/L (ref 20–31)
Chloride: 102 mmol/L (ref 98–110)
Creat: 0.75 mg/dL (ref 0.50–1.10)
GFR, Est Non African American: >89 mL/min (ref >=60)
GLUCOSE: 128 mg/dL — AB (ref 65–99)
Potassium: 4.2 mmol/L (ref 3.5–5.3)
Sodium: 138 mmol/L (ref 135–146)

## 2016-02-02 LAB — LIPID PANEL
CHOL/HDL RATIO: 3.7 ratio (ref <=5.0)
CHOLESTEROL: 274 mg/dL — AB (ref 125–200)
HDL: 74 mg/dL (ref >=46)
LDL Cholesterol: 179 mg/dL — ABNORMAL HIGH (ref <130)
TRIGLYCERIDES: 105 mg/dL (ref <150)
VLDL: 21 mg/dL (ref <30)

## 2016-02-02 LAB — T3, FREE: T3, Free: 2.4 pg/mL (ref 2.3–4.2)

## 2016-02-02 LAB — T4, FREE: Free T4: 1 ng/dL (ref 0.8–1.8)

## 2016-02-02 LAB — TSH: TSH: 1.67 mIU/L

## 2016-02-13 ENCOUNTER — Telehealth: Payer: Self-pay

## 2016-02-13 NOTE — Telephone Encounter (Signed)
Pt was called on 11/1 and informed pf her lab results.

## 2016-04-28 ENCOUNTER — Encounter: Payer: Self-pay | Admitting: Family Medicine

## 2016-04-28 ENCOUNTER — Ambulatory Visit: Payer: Self-pay | Attending: Family Medicine | Admitting: Family Medicine

## 2016-04-28 VITALS — BP 125/80 | HR 92 | Temp 97.8°F | Ht 62.0 in | Wt 156.8 lb

## 2016-04-28 DIAGNOSIS — E0500 Thyrotoxicosis with diffuse goiter without thyrotoxic crisis or storm: Secondary | ICD-10-CM | POA: Insufficient documentation

## 2016-04-28 DIAGNOSIS — IMO0001 Reserved for inherently not codable concepts without codable children: Secondary | ICD-10-CM

## 2016-04-28 DIAGNOSIS — E1165 Type 2 diabetes mellitus with hyperglycemia: Secondary | ICD-10-CM | POA: Insufficient documentation

## 2016-04-28 DIAGNOSIS — Z79899 Other long term (current) drug therapy: Secondary | ICD-10-CM | POA: Insufficient documentation

## 2016-04-28 DIAGNOSIS — Z794 Long term (current) use of insulin: Secondary | ICD-10-CM | POA: Insufficient documentation

## 2016-04-28 LAB — GLUCOSE, POCT (MANUAL RESULT ENTRY): POC GLUCOSE: 146 mg/dL — AB (ref 70–99)

## 2016-04-28 LAB — POCT GLYCOSYLATED HEMOGLOBIN (HGB A1C): Hemoglobin A1C: 7.4

## 2016-04-28 MED ORDER — METFORMIN HCL ER 500 MG PO TB24: ORAL_TABLET | ORAL | 5 refills | Status: DC

## 2016-04-28 MED ORDER — METOPROLOL SUCCINATE ER 25 MG PO TB24: 25.0000 mg | ORAL_TABLET | Freq: Every day | ORAL | 11 refills | Status: DC

## 2016-04-28 MED ORDER — METHIMAZOLE 10 MG PO TABS: 15.0000 mg | ORAL_TABLET | Freq: Every day | ORAL | 11 refills | Status: DC

## 2016-04-28 MED ORDER — INSULIN NPH ISOPHANE & REGULAR (70-30) 100 UNIT/ML ~~LOC~~ SUSP: 17.0000 [IU] | Freq: Two times a day (BID) | SUBCUTANEOUS | 5 refills | Status: DC

## 2016-04-28 NOTE — Patient Instructions (Addendum)
Alanys was seen today for diabetes.  Diagnoses and all orders for this visit:  Uncontrolled type 2 diabetes mellitus without complication, with long-term current use of insulin (HCC) -     HgB A1c -     Glucose (CBG) -     insulin NPH-regular Human (NOVOLIN 70/30) (70-30) 100 UNIT/ML injection; Inject 17 Units into the skin 2 (two) times daily with a meal. -     metFORMIN (GLUCOPHAGE XR) 500 MG 24 hr tablet; Take 4 tablets (2,000 mg total) by mouth daily after supper  Graves disease -     methimazole (TAPAZOLE) 10 MG tablet; Take 1.5 tablets (15 mg total) by mouth daily. -     metoprolol succinate (TOPROL-XL) 25 MG 24 hr tablet; Take 1 tablet (25 mg total) by mouth daily.   Increase insulin to 17 U twice daily  Recommendation robitussin D, Claritin D, allegra D or more time for congestion   F/u in 3 months for diabetes   Dr .Armen PickupFunches

## 2016-04-28 NOTE — Assessment & Plan Note (Signed)
Improved Increase 70/30 to 17 U BID

## 2016-04-28 NOTE — Progress Notes (Signed)
Patient ID: Rachael Russo, female   DOB: 1975/10/29, 41 y.o.   MRN: [id number]   Subjective:  Patient ID: Rachael Russo, female    DOB: 01/30/1976  Age: 41 y.o. MRN: [id number]  CC: No chief complaint on file.  Spanish interpreter used   HPI Rachael Russo has diabetes and Grave's disease she presents for f/u   1. CHRONIC DIABETES  Disease Monitoring  Blood Sugar Ranges:   Fasting: not checking   Post prandial: not checking   Polyuria: no   Visual problems: no   Medication Compliance: yes  Medication Side Effects  Hypoglycemia: subjective lows, feels faint at times. Last felt this way yesterday afternoon   [place] Exam: done   Foot Exam: not due   Diet pattern: not being careful with carbs   Exercise: no   2. Grave's disease: taking methimazole 15 mg daily. No HA, CP, palpitations or anxiety, no tremor.   Past Surgical History:  Procedure Laterality Date  . CESAREAN SECTION    . CESAREAN SECTION N/A 12/03/2013   Procedure: CESAREAN SECTION;  Surgeon: [name], MD;  Location: [facility];  Service: Obstetrics;  Laterality: N/A;   Social History  Substance Use Topics  . Smoking status: Never Smoker  . Smokeless tobacco: Not on file  . Alcohol use No   Outpatient Medications Prior to Visit  Medication Sig Dispense Refill  . Blood Glucose Monitoring Suppl (TRUE METRIX METER) w/Device KIT 1 each by Does not apply route 3 (three) times daily. 1 kit 0  . glucose blood (TRUE METRIX BLOOD GLUCOSE TEST) test strip 1 each by Other route 3 (three) times daily. 100 each 11  . glucose blood test strip Check blood sugar 2x/day  11  . insulin NPH-regular Human (NOVOLIN 70/30) (70-30) 100 UNIT/ML injection Inject 15 Units into the skin 2 (two) times daily with a meal. 10 mL 5  . metFORMIN (GLUCOPHAGE XR) 500 MG 24 hr tablet Take 4 tablets (2,000 mg total) by mouth daily after supper 120 tablet 5  . methimazole (TAPAZOLE) 10 MG tablet Take 1.5 tablets (15 mg total) by  mouth daily. 45 tablet 11  . metoprolol succinate (TOPROL-XL) 25 MG 24 hr tablet Take 1 tablet (25 mg total) by mouth daily. 90 tablet 3  . Prenatal Vit-Fe Fumarate-FA (PRENATAL MULTIVITAMIN) TABS tablet Take 1 tablet by mouth daily. 30 tablet 2  . TRUEPLUS LANCETS 28G MISC 1 each by Does not apply route 2 (two) times daily. 100 each 12   No facility-administered medications prior to visit.     ROS Review of Systems  Constitutional: Negative for chills and fever.  Eyes: Negative for visual disturbance.  Respiratory: Negative for shortness of breath.   Cardiovascular: Negative for chest pain.  Gastrointestinal: Negative for abdominal pain and blood in stool.  Musculoskeletal: Negative for arthralgias and back pain.  Skin: Negative for rash.  Allergic/Immunologic: Negative for immunocompromised state.  Hematological: Negative for adenopathy. Does not bruise/bleed easily.  Psychiatric/Behavioral: Negative for dysphoric mood and suicidal ideas.    Objective:  There were no vitals taken for this visit. Pulse Readings from Last 3 Encounters:  02/01/16 90  10/30/15 92  07/26/15 90    BP/Weight 02/01/2016 10/30/2015 5/46/2703  Systolic BP 500 938 182  Diastolic BP 81 84 79  Wt. (Lbs) 155 155 154  BMI 28.35 28.34 28.16    Physical Exam  Constitutional: She is oriented to person, place, and time. She appears well-developed and well-nourished.  No distress.  HENT:  Head: Normocephalic and atraumatic.  Cardiovascular: Normal rate, regular rhythm, normal heart sounds and intact distal pulses.   Pulmonary/Chest: Effort normal and breath sounds normal.  Musculoskeletal: She exhibits no edema.  Neurological: She is alert and oriented to person, place, and time.  Skin: Skin is warm and dry. No rash noted.  Psychiatric: She has a normal mood and affect.   Lab Results  Component Value Date   HGBA1C 7.5 02/01/2016   CBG  Assessment & Plan:   Problem List Items Addressed This Visit        High   Uncontrolled type 2 diabetes mellitus without complication, with long-term current use of insulin ([place]) - Primary (Chronic)   Relevant Orders   HgB A1c   Glucose (CBG)      No orders of the defined types were placed in this encounter.   Follow-up: No Follow-up on file.   Boykin Nearing MD

## 2016-04-28 NOTE — Progress Notes (Signed)
Patient ID: Rachael Russo, female   DOB: 02/18/1976, 40 y.o.   MRN: [id number]   Subjective:  Patient ID: Rachael Russo, female    DOB: 09/29/1975  Age: 40 y.o. MRN: [id number]  CC: Diabetes  Adolfo ID # [id number]  HPI Rachael Russo has diabetes and Grave's disease she presents for f/u   1. CHRONIC DIABETES  Disease Monitoring  Blood Sugar Ranges:   Fasting:  99-141  Post prandial: 122- 192, higher sugars in evenings before and after supper   Polyuria: no   Visual problems: no   Medication Compliance: yes, taking 13 U BID of novolin, Medication Side Effects  Hypoglycemia: subjective lows, feels faint at times. Last felt this way yesterday afternoon   [facility]  Eye Exam: done   Foot Exam: not due   Diet pattern: not being careful with carbs   Exercise: no   2. Grave's disease: taking methimazole 15 mg daily. No HA, CP, palpitations or anxiety, no tremor.   3. Viral symptoms: 2 days of cough, R ear pressure. Improved. No fever or chills. No sore throat.   Past Surgical History:  Procedure Laterality Date  . CESAREAN SECTION    . CESAREAN SECTION N/A 12/03/2013   Procedure: CESAREAN SECTION;  Surgeon: Myra C Dove, MD;  Location: [facility];  Service: Obstetrics;  Laterality: N/A;   Social History  Substance Use Topics  . Smoking status: Never Smoker  . Smokeless tobacco: Not on file  . Alcohol use No   Outpatient Medications Prior to Visit  Medication Sig Dispense Refill  . Blood Glucose Monitoring Suppl (TRUE METRIX METER) w/Device KIT 1 each by Does not apply route 3 (three) times daily. 1 kit 0  . glucose blood (TRUE METRIX BLOOD GLUCOSE TEST) test strip 1 each by Other route 3 (three) times daily. 100 each 11  . glucose blood test strip Check blood sugar 2x/day  11  . insulin NPH-regular Human (NOVOLIN 70/30) (70-30) 100 UNIT/ML injection Inject 15 Units into the skin 2 (two) times daily with a meal. 10 mL 5  . metFORMIN (GLUCOPHAGE XR) 500 MG 24 hr tablet Take  4 tablets (2,000 mg total) by mouth daily after supper 120 tablet 5  . methimazole (TAPAZOLE) 10 MG tablet Take 1.5 tablets (15 mg total) by mouth daily. 45 tablet 11  . metoprolol succinate (TOPROL-XL) 25 MG 24 hr tablet Take 1 tablet (25 mg total) by mouth daily. 90 tablet 3  . Prenatal Vit-Fe Fumarate-FA (PRENATAL MULTIVITAMIN) TABS tablet Take 1 tablet by mouth daily. 30 tablet 2  . TRUEPLUS LANCETS 28G MISC 1 each by Does not apply route 2 (two) times daily. 100 each 12   No facility-administered medications prior to visit.     ROS Review of Systems  Constitutional: Negative for chills and fever.  Eyes: Negative for visual disturbance.  Respiratory: Negative for shortness of breath.   Cardiovascular: Negative for chest pain.  Gastrointestinal: Negative for abdominal pain and blood in stool.  Musculoskeletal: Negative for arthralgias and back pain.  Skin: Negative for rash.  Allergic/Immunologic: Negative for immunocompromised state.  Hematological: Negative for adenopathy. Does not bruise/bleed easily.  Psychiatric/Behavioral: Negative for dysphoric mood and suicidal ideas.    Objective:  BP 125/80 (BP Location: Left Arm, Patient Position: Sitting, Cuff Size: Small)   Pulse 92   Temp 97.8 F (36.6 C) (Oral)   Ht 5' 2" (1.575 m)   Wt 156 lb 12.8 oz (71.1 kg)   LMP 04/05/2016     SpO2 100%   BMI 28.68 kg/m  Pulse Readings from Last 3 Encounters:  04/28/16 92  02/01/16 90  10/30/15 92    BP/Weight 04/28/2016 02/01/2016 10/30/2015  Systolic BP 125 128 128  Diastolic BP 80 81 84  Wt. (Lbs) 156.8 155 155  BMI 28.68 28.35 28.34    Physical Exam  Constitutional: She is oriented to person, place, and time. She appears well-developed and well-nourished. No distress.  HENT:  Head: Normocephalic and atraumatic.  Right Ear: Tympanic membrane and ear canal normal.  Left Ear: Tympanic membrane, external ear and ear canal normal.  Nose:    Cardiovascular: Normal rate, regular  rhythm, normal heart sounds and intact distal pulses.   Pulmonary/Chest: Effort normal and breath sounds normal.  Musculoskeletal: She exhibits no edema.  Neurological: She is alert and oriented to person, place, and time.  Skin: Skin is warm and dry. No rash noted.  Psychiatric: She has a normal mood and affect.   Lab Results  Component Value Date   HGBA1C 7.4 04/28/2016   Last A1c 7.5   CBG 146  Assessment & Plan:   Problem List Items Addressed This Visit      High   Uncontrolled type 2 diabetes mellitus without complication, with long-term current use of insulin (HCC) - Primary (Chronic)   Relevant Medications   insulin NPH-regular Human (NOVOLIN 70/30) (70-30) 100 UNIT/ML injection   metFORMIN (GLUCOPHAGE XR) 500 MG 24 hr tablet   Other Relevant Orders   HgB A1c (Completed)   Glucose (CBG) (Completed)   Graves disease (Chronic)   Relevant Medications   methimazole (TAPAZOLE) 10 MG tablet   metoprolol succinate (TOPROL-XL) 25 MG 24 hr tablet      No orders of the defined types were placed in this encounter.   Follow-up: Return in about 3 months (around 07/27/2016) for diabetes .     MD    

## 2016-08-21 ENCOUNTER — Ambulatory Visit: Payer: Self-pay | Attending: Family Medicine | Admitting: Family Medicine

## 2016-08-21 ENCOUNTER — Encounter: Payer: Self-pay | Admitting: Family Medicine

## 2016-08-21 VITALS — BP 123/82 | HR 82 | Temp 98.2°F | Wt 160.2 lb

## 2016-08-21 DIAGNOSIS — E1165 Type 2 diabetes mellitus with hyperglycemia: Secondary | ICD-10-CM | POA: Insufficient documentation

## 2016-08-21 DIAGNOSIS — E0500 Thyrotoxicosis with diffuse goiter without thyrotoxic crisis or storm: Secondary | ICD-10-CM | POA: Insufficient documentation

## 2016-08-21 DIAGNOSIS — Z794 Long term (current) use of insulin: Secondary | ICD-10-CM | POA: Insufficient documentation

## 2016-08-21 DIAGNOSIS — IMO0001 Reserved for inherently not codable concepts without codable children: Secondary | ICD-10-CM

## 2016-08-21 LAB — POCT GLYCOSYLATED HEMOGLOBIN (HGB A1C): HEMOGLOBIN A1C: 7.2

## 2016-08-21 LAB — GLUCOSE, POCT (MANUAL RESULT ENTRY): POC GLUCOSE: 144 mg/dL — AB (ref 70–99)

## 2016-08-21 NOTE — Patient Instructions (Addendum)
Jayliana was seen today for diabetes.  Diagnoses and all orders for this visit:  Graves disease -     TSH -     T3, Free -     T4, free; Future  Uncontrolled type 2 diabetes mellitus without complication, with long-term current use of insulin (HCC) -     POCT glucose (manual entry) -     POCT glycosylated hemoglobin (Hb A1C) -     Ambulatory referral to Ophthalmology   f/u in 3-4 weeks for pap smear   Dr. Armen PickupFunches

## 2016-08-21 NOTE — Progress Notes (Signed)
Patient ID: Arneshia Ade, female   DOB: 1976-04-07, 41 y.o.   MRN: [id number]   Subjective:  Patient ID: [name], female    DOB: 09-08-1975  Age: 41 y.o. MRN: [id number]  CC: Diabetes  [employer] video interpreter Hilda Lias  ID # [id number]  HPI Nykeria Mealing has diabetes and Grave's disease she presents for f/u   1. CHRONIC DIABETES  Disease Monitoring  Blood Sugar Ranges:   Fasting:  114-138  Post prandial: 130-180  Polyuria: no   Visual problems: no   Medication Compliance: yes, taking 17 U BID of Novolin and metformin 2000 daily  Medication Side Effects  Hypoglycemia: no   Pre[facility] Care  Eye Exam: done   Foot Exam: not due   Diet pattern: not being careful with carbs   Exercise: no   2. Grave's disease: taking methimazole 15 mg daily. No HA, CP, palpitations or anxiety, no tremor.   Past Surgical History:  Procedure Laterality Date  . CESAREAN SECTION    . CESAREAN SECTION N/A 12/03/2013   Procedure: CESAREAN SECTION;  Surgeon: [name], MD;  Location: [facility];  Service: Obstetrics;  Laterality: N/A;   Social History  Substance Use Topics  . Smoking status: Never Smoker  . Smokeless tobacco: Not on file  . Alcohol use No   Outpatient Medications Prior to Visit  Medication Sig Dispense Refill  . Blood Glucose Monitoring Suppl (TRUE METRIX METER) w/Device KIT 1 each by Does not apply route 3 (three) times daily. 1 kit 0  . glucose blood (TRUE METRIX BLOOD GLUCOSE TEST) test strip 1 each by Other route 3 (three) times daily. 100 each 11  . glucose blood test strip Check blood sugar 2x/day  11  . insulin NPH-regular Human (NOVOLIN 70/30) (70-30) 100 UNIT/ML injection Inject 17 Units into the skin 2 (two) times daily with a meal. 10 mL 5  . metFORMIN (GLUCOPHAGE XR) 500 MG 24 hr tablet Take 4 tablets (2,000 mg total) by mouth daily after supper 120 tablet 5  . methimazole (TAPAZOLE) 10 MG tablet Take 1.5 tablets (15 mg total) by mouth daily. 45 tablet 11    . metoprolol succinate (TOPROL-XL) 25 MG 24 hr tablet Take 1 tablet (25 mg total) by mouth daily. 30 tablet 11  . Prenatal Vit-Fe Fumarate-FA (PRENATAL MULTIVITAMIN) TABS tablet Take 1 tablet by mouth daily. 30 tablet 2  . TRUEPLUS LANCETS 28G MISC 1 each by Does not apply route 2 (two) times daily. 100 each 12   No facility-administered medications prior to visit.     ROS Review of Systems  Constitutional: Negative for chills and fever.  Eyes: Negative for visual disturbance.  Respiratory: Negative for shortness of breath.   Cardiovascular: Negative for chest pain.  Gastrointestinal: Negative for abdominal pain and blood in stool.  Musculoskeletal: Negative for arthralgias and back pain.  Skin: Negative for rash.  Allergic/Immunologic: Negative for immunocompromised state.  Hematological: Negative for adenopathy. Does not bruise/bleed easily.  Psychiatric/Behavioral: Negative for dysphoric mood and suicidal ideas.    Objective:  BP 123/82   Pulse 82   Temp 98.2 F (36.8 C) (Oral)   Wt 160 lb 3.2 oz (72.7 kg)   SpO2 100%   BMI 29.30 kg/m  Pulse Readings from Last 3 Encounters:  08/21/16 82  04/28/16 92  02/01/16 90   Wt Readings from Last 3 Encounters:  08/21/16 160 lb 3.2 oz (72.7 kg)  04/28/16 156 lb 12.8 oz (71.1 kg)  02/01/16 155  lb (70.3 kg)    BP/Weight 08/21/2016 04/28/2016 38/68/5488  Systolic BP 301 415 973  Diastolic BP 82 80 81  Wt. (Lbs) 160.2 156.8 155  BMI 29.3 28.68 28.35    Physical Exam  Constitutional: She is oriented to person, place, and time. She appears well-developed and well-nourished. No distress.  HENT:  Head: Normocephalic and atraumatic.  Right Ear: Tympanic membrane and ear canal normal.  Left Ear: Tympanic membrane, external ear and ear canal normal.  Nose:    Cardiovascular: Normal rate, regular rhythm, normal heart sounds and intact distal pulses.   Pulmonary/Chest: Effort normal and breath sounds normal.  Musculoskeletal: She  exhibits no edema.  Neurological: She is alert and oriented to person, place, and time.  Skin: Skin is warm and dry. No rash noted.  Psychiatric: She has a normal mood and affect.   Lab Results  Component Value Date   HGBA1C 7.2 08/21/2016   Lab Results  Component Value Date   TSH 2.120 08/21/2016   CBG 144 Assessment & Plan:   Problem List Items Addressed This Visit      High   Uncontrolled type 2 diabetes mellitus without complication, with long-term current use of insulin ([place]) (Chronic)   Relevant Orders   POCT glucose (manual entry) (Completed)   POCT glycosylated hemoglobin (Hb A1C) (Completed)   Ambulatory referral to Ophthalmology   Graves disease - Primary (Chronic)   Relevant Orders   TSH (Completed)   T3, Free (Completed)   T4, free (Completed)      No orders of the defined types were placed in this encounter.   Follow-up: Return in about 4 weeks (around 09/18/2016) for pap smear.   Boykin Nearing MD

## 2016-08-22 LAB — T3, FREE: T3 FREE: 3 pg/mL (ref 2.0–4.4)

## 2016-08-22 LAB — TSH: TSH: 2.12 u[IU]/mL (ref 0.450–4.500)

## 2016-08-22 LAB — T4, FREE: FREE T4: 1.15 ng/dL (ref 0.82–1.77)

## 2016-08-25 ENCOUNTER — Encounter: Payer: Self-pay | Admitting: Family Medicine

## 2016-08-25 NOTE — Assessment & Plan Note (Signed)
Improved with A1c of 7.2 Plan: Add exercise Continue current medication regimen Goal A1c < 7

## 2016-08-25 NOTE — Assessment & Plan Note (Signed)
A: euthyroid on 15 mg of methimazole daily P: Continue current dose

## 2016-08-27 ENCOUNTER — Encounter: Payer: Self-pay | Admitting: Family Medicine

## 2016-09-03 ENCOUNTER — Telehealth: Payer: Self-pay

## 2016-09-03 NOTE — Telephone Encounter (Signed)
Pt was called and informed of lab results. 

## 2016-09-11 ENCOUNTER — Other Ambulatory Visit: Payer: Self-pay | Admitting: Family Medicine

## 2016-09-26 ENCOUNTER — Ambulatory Visit: Payer: Self-pay | Attending: Family Medicine | Admitting: Family Medicine

## 2016-09-26 ENCOUNTER — Encounter: Payer: Self-pay | Admitting: Family Medicine

## 2016-09-26 VITALS — BP 139/81 | HR 78 | Temp 99.0°F | Resp 16 | Wt 160.8 lb

## 2016-09-26 DIAGNOSIS — IMO0001 Reserved for inherently not codable concepts without codable children: Secondary | ICD-10-CM

## 2016-09-26 DIAGNOSIS — E1165 Type 2 diabetes mellitus with hyperglycemia: Secondary | ICD-10-CM | POA: Insufficient documentation

## 2016-09-26 DIAGNOSIS — Z9851 Tubal ligation status: Secondary | ICD-10-CM | POA: Insufficient documentation

## 2016-09-26 DIAGNOSIS — Z794 Long term (current) use of insulin: Secondary | ICD-10-CM | POA: Insufficient documentation

## 2016-09-26 DIAGNOSIS — Z124 Encounter for screening for malignant neoplasm of cervix: Secondary | ICD-10-CM | POA: Insufficient documentation

## 2016-09-26 DIAGNOSIS — N898 Other specified noninflammatory disorders of vagina: Secondary | ICD-10-CM

## 2016-09-26 DIAGNOSIS — E0500 Thyrotoxicosis with diffuse goiter without thyrotoxic crisis or storm: Secondary | ICD-10-CM | POA: Insufficient documentation

## 2016-09-26 DIAGNOSIS — Z79899 Other long term (current) drug therapy: Secondary | ICD-10-CM | POA: Insufficient documentation

## 2016-09-26 LAB — GLUCOSE, POCT (MANUAL RESULT ENTRY): POC GLUCOSE: 197 mg/dL — AB (ref 70–99)

## 2016-09-26 NOTE — Patient Instructions (Addendum)
Rachael Russo was seen today for gynecologic exam.  Diagnoses and all orders for this visit:  Uncontrolled type 2 diabetes mellitus without complication, with long-term current use of insulin (HCC) -     POCT glucose (manual entry)  Pap smear for cervical cancer screening -     Cytology - PAP   You will be called with pap results  F/u in 3 months for diabetes and hyperthyroidism   Dr. Armen PickupFunches

## 2016-09-26 NOTE — Assessment & Plan Note (Signed)
Asymptomatic vaginal discharge noted on pelvic exam Screening pap and wet prep done

## 2016-09-26 NOTE — Progress Notes (Signed)
Subjective:  Patient ID: Rachael Russo, female    DOB: 28-May-1975  Age: 41 y.o. MRN: [id number]  CC: Gynecologic Exam   HPI Rachael Russo diabetes and Grave's disease she presents for f/u  presents for   1. Screening pap smear: she denies irregular periods, vaginal discharge and pelvic pain. She is s/p tubal ligation for contraception.   Social History  Substance Use Topics  . Smoking status: Never Smoker  . Smokeless tobacco: Never Used  . Alcohol use No    Outpatient Medications Prior to Visit  Medication Sig Dispense Refill  . Blood Glucose Monitoring Suppl (TRUE METRIX METER) w/Device KIT 1 each by Does not apply route 3 (three) times daily. 1 kit 0  . glucose blood (TRUE METRIX BLOOD GLUCOSE TEST) test strip 1 each by Other route 3 (three) times daily. 100 each 11  . glucose blood test strip Check blood sugar 2x/day  11  . insulin NPH-regular Human (NOVOLIN 70/30) (70-30) 100 UNIT/ML injection Inject 17 Units into the skin 2 (two) times daily with a meal. 10 mL 5  . metFORMIN (GLUCOPHAGE XR) 500 MG 24 hr tablet Take 4 tablets (2,000 mg total) by mouth daily after supper 120 tablet 5  . methimazole (TAPAZOLE) 10 MG tablet Take 1.5 tablets (15 mg total) by mouth daily. 45 tablet 11  . metoprolol succinate (TOPROL-XL) 25 MG 24 hr tablet Take 1 tablet (25 mg total) by mouth daily. 30 tablet 11  . Prenatal Vit-Fe Fumarate-FA (PRENATAL MULTIVITAMIN) TABS tablet Take 1 tablet by mouth daily. 30 tablet 2  . TRUEPLUS LANCETS 28G MISC 1 each by Does not apply route 2 (two) times daily. 100 each 12   No facility-administered medications prior to visit.     ROS Review of Systems  Constitutional: Negative for chills and fever.  Eyes: Negative for visual disturbance.  Respiratory: Negative for shortness of breath.   Cardiovascular: Negative for chest pain.  Gastrointestinal: Negative for abdominal pain and blood in stool.  Genitourinary: Negative for pelvic pain, vaginal bleeding,  vaginal discharge and vaginal pain.  Musculoskeletal: Negative for arthralgias and back pain.  Skin: Negative for rash.  Allergic/Immunologic: Negative for immunocompromised state.  Hematological: Negative for adenopathy. Does not bruise/bleed easily.  Psychiatric/Behavioral: Negative for dysphoric mood and suicidal ideas.    Objective:  BP 139/81   Pulse 78   Temp 99 F (37.2 C) (Oral)   Resp 16   Wt 160 lb 12.8 oz (72.9 kg)   SpO2 99%   BMI 29.41 kg/m   BP/Weight 09/26/2016 08/21/2016 0/01/711  Systolic BP 197 588 325  Diastolic BP 81 82 80  Wt. (Lbs) 160.8 160.2 156.8  BMI 29.41 29.3 28.68    Physical Exam  Constitutional: She is oriented to person, place, and time. She appears well-developed and well-nourished. No distress.  HENT:  Head: Normocephalic and atraumatic.  Cardiovascular: Intact distal pulses.   Pulmonary/Chest: Effort normal.  Genitourinary: Uterus normal. Pelvic exam was performed with patient prone. There is no rash, tenderness or lesion on the right labia. There is no rash, tenderness or lesion on the left labia. Cervix exhibits no motion tenderness, no discharge and no friability. Vaginal discharge (moderate, thick, white ) found.  Musculoskeletal: She exhibits no edema.  Lymphadenopathy:       Right: No inguinal adenopathy present.       Left: No inguinal adenopathy present.  Neurological: She is alert and oriented to person, place, and time.  Skin: Skin is warm and  dry. No rash noted.  Psychiatric: She has a normal mood and affect.   Lab Results  Component Value Date   HGBA1C 7.2 08/21/2016   CBG 197   Lab Results  Component Value Date   TSH 2.120 08/21/2016    Assessment & Plan:  Rachael Russo was seen today for gynecologic exam.  Diagnoses and all orders for this visit:  Uncontrolled type 2 diabetes mellitus without complication, with long-term current use of insulin (Rachael Russo) -     POCT glucose (manual entry)  Pap smear for cervical cancer  screening -     Cytology - PAP   There are no diagnoses linked to this encounter.  No orders of the defined types were placed in this encounter.   Follow-up: Return in about 3 months (around 12/27/2016) for diabetes and hyperthyroidism.   Boykin Nearing MD

## 2016-09-29 LAB — CERVICOVAGINAL ANCILLARY ONLY
CANDIDA VAGINITIS: NEGATIVE
CHLAMYDIA, DNA PROBE: NEGATIVE
Neisseria Gonorrhea: NEGATIVE
TRICH (WINDOWPATH): NEGATIVE

## 2016-09-30 LAB — CYTOLOGY - PAP
DIAGNOSIS: NEGATIVE
HPV (WINDOPATH): NOT DETECTED

## 2016-10-10 ENCOUNTER — Other Ambulatory Visit: Payer: Self-pay | Admitting: Family Medicine

## 2016-10-10 DIAGNOSIS — Z794 Long term (current) use of insulin: Principal | ICD-10-CM

## 2016-10-10 DIAGNOSIS — IMO0001 Reserved for inherently not codable concepts without codable children: Secondary | ICD-10-CM

## 2016-10-10 DIAGNOSIS — E1165 Type 2 diabetes mellitus with hyperglycemia: Principal | ICD-10-CM

## 2016-10-13 ENCOUNTER — Telehealth: Payer: Self-pay

## 2016-10-13 NOTE — Telephone Encounter (Signed)
Pt was called and informed of lab results. 

## 2016-11-10 ENCOUNTER — Other Ambulatory Visit: Payer: Self-pay | Admitting: Family Medicine

## 2016-11-10 DIAGNOSIS — IMO0001 Reserved for inherently not codable concepts without codable children: Secondary | ICD-10-CM

## 2016-11-10 DIAGNOSIS — E1165 Type 2 diabetes mellitus with hyperglycemia: Principal | ICD-10-CM

## 2016-11-10 DIAGNOSIS — Z794 Long term (current) use of insulin: Principal | ICD-10-CM

## 2016-11-12 ENCOUNTER — Other Ambulatory Visit: Payer: Self-pay | Admitting: Family Medicine

## 2016-11-12 DIAGNOSIS — E1165 Type 2 diabetes mellitus with hyperglycemia: Principal | ICD-10-CM

## 2016-11-12 DIAGNOSIS — IMO0001 Reserved for inherently not codable concepts without codable children: Secondary | ICD-10-CM

## 2016-11-12 DIAGNOSIS — Z794 Long term (current) use of insulin: Principal | ICD-10-CM

## 2016-11-13 ENCOUNTER — Other Ambulatory Visit: Payer: Self-pay | Admitting: Family Medicine

## 2016-11-13 DIAGNOSIS — E1165 Type 2 diabetes mellitus with hyperglycemia: Principal | ICD-10-CM

## 2016-11-13 DIAGNOSIS — IMO0001 Reserved for inherently not codable concepts without codable children: Secondary | ICD-10-CM

## 2016-11-13 DIAGNOSIS — Z794 Long term (current) use of insulin: Principal | ICD-10-CM

## 2016-12-29 ENCOUNTER — Ambulatory Visit: Payer: Self-pay | Attending: Family Medicine | Admitting: Family Medicine

## 2016-12-29 ENCOUNTER — Encounter: Payer: Self-pay | Admitting: Family Medicine

## 2016-12-29 VITALS — BP 123/78 | HR 73 | Temp 98.4°F | Resp 18 | Ht 63.0 in | Wt 156.4 lb

## 2016-12-29 DIAGNOSIS — E1165 Type 2 diabetes mellitus with hyperglycemia: Secondary | ICD-10-CM | POA: Insufficient documentation

## 2016-12-29 DIAGNOSIS — R809 Proteinuria, unspecified: Secondary | ICD-10-CM | POA: Insufficient documentation

## 2016-12-29 DIAGNOSIS — Z79899 Other long term (current) drug therapy: Secondary | ICD-10-CM | POA: Insufficient documentation

## 2016-12-29 DIAGNOSIS — Z794 Long term (current) use of insulin: Secondary | ICD-10-CM | POA: Insufficient documentation

## 2016-12-29 LAB — POCT GLYCOSYLATED HEMOGLOBIN (HGB A1C): Hemoglobin A1C: 7.4

## 2016-12-29 LAB — POCT UA - MICROALBUMIN
Creatinine, POC: 50 mg/dL
MICROALBUMIN (UR) POC: 30 mg/L

## 2016-12-29 LAB — GLUCOSE, POCT (MANUAL RESULT ENTRY): POC Glucose: 209 mg/dl — AB (ref 70–99)

## 2016-12-29 MED ORDER — GLUCOSE BLOOD VI STRP: ORAL_STRIP | 11 refills | Status: DC

## 2016-12-29 MED ORDER — LISINOPRIL 2.5 MG PO TABS: 2.5000 mg | ORAL_TABLET | Freq: Every day | ORAL | 2 refills | Status: DC

## 2016-12-29 MED ORDER — TRUEPLUS LANCETS 28G MISC: 1.0000 | Freq: Two times a day (BID) | 12 refills | Status: DC

## 2016-12-29 MED ORDER — GLUCOSE BLOOD VI STRP: 1.0000 | ORAL_STRIP | Freq: Three times a day (TID) | 11 refills | Status: DC

## 2016-12-29 MED ORDER — INSULIN NPH ISOPHANE & REGULAR (70-30) 100 UNIT/ML ~~LOC~~ SUSP: 19.0000 [IU] | Freq: Two times a day (BID) | SUBCUTANEOUS | 6 refills | Status: DC

## 2016-12-29 NOTE — Progress Notes (Signed)
JAPatient is here for f/up   Patient needs refill on insulin Novolin

## 2016-12-29 NOTE — Progress Notes (Signed)
Subjective:  Patient ID: Rachael Russo, female    DOB: 10/23/1975  Age: 41 y.o. MRN: [id number]  CC: Diabetes   HPI Rachael Russo presents for diabetes follow up. Symptoms: none. Patient denies foot ulcerations, nausea, paresthesia of the feet, polydipsia, polyuria, visual disturbances and vomitting.  Evaluation to date has been included: fasting blood sugar and hemoglobin A1C.  Home sugars: BGs are running  consistent with Hgb A1C. CBG ranges 98-134 fasting. She reports not check CBG's in a couple week due to lack of testing strips. Treatment to date: insulin and metformin.   Outpatient Medications Prior to Visit  Medication Sig Dispense Refill  . metFORMIN (GLUCOPHAGE-XR) 500 MG 24 hr tablet TAKE 4 TABLETS (2,000 MG TOTAL) BY MOUTH DAILY AFTER SUPPER 120 tablet 5  . methimazole (TAPAZOLE) 10 MG tablet Take 1.5 tablets (15 mg total) by mouth daily. 45 tablet 11  . metoprolol succinate (TOPROL-XL) 25 MG 24 hr tablet Take 1 tablet (25 mg total) by mouth daily. 30 tablet 11  . insulin NPH-regular Human (NOVOLIN 70/30) (70-30) 100 UNIT/ML injection Inject 17 Units into the skin 2 (two) times daily with a meal. 10 mL 5  . Blood Glucose Monitoring Suppl (TRUE METRIX METER) w/Device KIT 1 each by Does not apply route 3 (three) times daily. 1 kit 0  . Prenatal Vit-Fe Fumarate-FA (PRENATAL MULTIVITAMIN) TABS tablet Take 1 tablet by mouth daily. 30 tablet 2  . glucose blood (TRUE METRIX BLOOD GLUCOSE TEST) test strip 1 each by Other route 3 (three) times daily. 100 each 11  . glucose blood test strip Check blood sugar 2x/day  11  . TRUEPLUS LANCETS 28G MISC 1 each by Does not apply route 2 (two) times daily. 100 each 12   No facility-administered medications prior to visit.     ROS Review of Systems  Constitutional: Negative.   Eyes: Negative.   Respiratory: Negative.   Cardiovascular: Negative.   Gastrointestinal: Negative.   Endocrine: Negative.   Skin: Negative.   Neurological: Negative  for numbness.  Psychiatric/Behavioral: Negative.    Objective:  BP 123/78 (BP Location: Left Arm, Patient Position: Sitting, Cuff Size: Normal)   Pulse 73   Temp 98.4 F (36.9 C) (Oral)   Resp 18   Ht 5' 3"  (1.6 m)   Wt 156 lb 6.4 oz (70.9 kg)   SpO2 99%   BMI 27.71 kg/m   BP/Weight 12/29/2016 09/26/2016 8/45/3646  Systolic BP 803 212 248  Diastolic BP 78 81 82  Wt. (Lbs) 156.4 160.8 160.2  BMI 27.71 29.41 29.3    Physical Exam  Constitutional: She appears well-developed and well-nourished.  Eyes: Pupils are equal, round, and reactive to light. Conjunctivae are normal.  Cardiovascular: Normal rate, regular rhythm, normal heart sounds and intact distal pulses.   Pulmonary/Chest: Effort normal and breath sounds normal.  Abdominal: Soft. Bowel sounds are normal. There is no tenderness.  Neurological: No sensory deficit.  Skin: Skin is warm and dry.  Nursing note and vitals reviewed.     Diabetic Foot Exam - Simple   Simple Foot Form Diabetic Foot exam was performed with the following findings:  Yes 12/29/2016 10:41 AM  Visual Inspection No deformities, no ulcerations, no other skin breakdown bilaterally:  Yes Sensation Testing Intact to touch and monofilament testing bilaterally:  Yes Pulse Check Posterior Tibialis and Dorsalis pulse intact bilaterally:  Yes Comments       Assessment & Plan:   1. Controlled type 2 diabetes mellitus with hyperglycemia, with  long-term current use of insulin (HCC) Increased insulin for better blood glucose control. Check CBG TID and bring glucometer or log to next office visit. Follow up with clinical pharmacist in 2 weeks. - HgB A1c - Glucose (CBG) - POCT UA - Microalbumin - insulin NPH-regular Human (NOVOLIN 70/30) (70-30) 100 UNIT/ML injection; Inject 19 Units into the skin 2 (two) times daily with a meal.  Dispense: 10 mL; Refill: 6 - glucose blood (TRUE METRIX BLOOD GLUCOSE TEST) test strip; 1 each by Other route 3 (three) times  daily.  Dispense: 100 each; Refill: 11 - glucose blood test strip; Check blood sugars twice a day.  Dispense: 100 each; Refill: 11 - TRUEPLUS LANCETS 28G MISC; 1 each by Does not apply route 2 (two) times daily.  Dispense: 100 each; Refill: 12 - CMP and Liver - Lipid Panel  2. Urine test positive for microalbuminuria  - lisinopril (PRINIVIL,ZESTRIL) 2.5 MG tablet; Take 1 tablet (2.5 mg total) by mouth daily.  Dispense: 30 tablet; Refill: 2   Meds ordered this encounter  Medications  . insulin NPH-regular Human (NOVOLIN 70/30) (70-30) 100 UNIT/ML injection    Sig: Inject 19 Units into the skin 2 (two) times daily with a meal.    Dispense:  10 mL    Refill:  6    Order Specific Question:   Supervising Provider    Answer:   Tresa Garter [id number]  . glucose blood (TRUE METRIX BLOOD GLUCOSE TEST) test strip    Sig: 1 each by Other route 3 (three) times daily.    Dispense:  100 each    Refill:  11    Order Specific Question:   Supervising Provider    Answer:   Tresa Garter [id number]  . glucose blood test strip    Sig: Check blood sugars twice a day.    Dispense:  100 each    Refill:  11    Order Specific Question:   Supervising Provider    Answer:   Tresa Garter [id number]  . TRUEPLUS LANCETS 28G MISC    Sig: 1 each by Does not apply route 2 (two) times daily.    Dispense:  100 each    Refill:  12    Order Specific Question:   Supervising Provider    Answer:   Tresa Garter [id number]  . lisinopril (PRINIVIL,ZESTRIL) 2.5 MG tablet    Sig: Take 1 tablet (2.5 mg total) by mouth daily.    Dispense:  30 tablet    Refill:  2    Order Specific Question:   Supervising Provider    Answer:   Tresa Garter [id number]    Follow-up: Return in about 2 weeks (around 01/12/2017) for DM check with Stacy.   Alfonse Spruce FNP

## 2016-12-29 NOTE — Patient Instructions (Addendum)
Type 2 Diabetes Mellitus, Self Care, Adult When you have type 2 diabetes (type 2 diabetes mellitus), you must keep your blood sugar (glucose) under control. You can do this with:  Nutrition.  Exercise.  Lifestyle changes.  Medicines or insulin, if needed.  Support from your doctors and others.  How do I manage my blood sugar?  Check your blood sugar level every day, as often as told.  Call your doctor if your blood sugar is above your goal numbers for 2 tests in a row.  Have your A1c (hemoglobin A1c) level checked at least two times a year. Have it checked more often if your doctor tells you to. Your doctor will set treatment goals for you. Generally, you should have these blood sugar levels:  Before meals (preprandial): 80-130 mg/dL (4.4-7.2 mmol/L).  After meals (postprandial): lower than 180 mg/dL (10 mmol/L).  A1c level: less than 7%.  What do I need to know about high blood sugar? High blood sugar is called hyperglycemia. Know the signs of high blood sugar. Signs may include:  Feeling: ? Thirsty. ? Hungry. ? Very tired.  Needing to pee (urinate) more than usual.  Blurry vision.  What do I need to know about low blood sugar? Low blood sugar is called hypoglycemia. This is when blood sugar is at or below 70 mg/dL (3.9 mmol/L). Symptoms may include:  Feeling: ? Hungry. ? Worried or nervous (anxious). ? Sweaty and clammy. ? Confused. ? Dizzy. ? Sleepy. ? Sick to your stomach (nauseous).  Having: ? A fast heartbeat (palpitations). ? A headache. ? A change in your vision. ? Jerky movements that you cannot control (seizure). ? Nightmares. ? Tingling or no feeling (numbness) around the mouth, lips, or tongue.  Having trouble with: ? Talking. ? Paying attention (concentrating). ? Moving (coordination). ? Sleeping.  Shaking.  Passing out (fainting).  Getting upset easily (irritability).  Treating low blood sugar  To treat low blood sugar, eat or  drink something sugary right away. If you can think clearly and swallow safely, follow the 15:15 rule:  Take 15 grams of a fast-acting carb (carbohydrate). Some fast-acting carbs are: ? 1 tube of glucose gel. ? 3 sugar tablets (glucose pills). ? 6-8 pieces of hard candy. ? 4 oz (120 mL) of fruit juice. ? 4 oz (120 mL) regular (not diet) soda.  Check your blood sugar 15 minutes after you take the carb.  If your blood sugar is still at or below 70 mg/dL (3.9 mmol/L), take 15 grams of a carb again.  If your blood sugar does not go above 70 mg/dL (3.9 mmol/L) after 3 tries, get help right away.  After your blood sugar goes back to normal, eat a meal or a snack within 1 hour.  Treating very low blood sugar If your blood sugar is at or below 54 mg/dL (3 mmol/L), you have very low blood sugar (severe hypoglycemia). This is an emergency. Do not wait to see if the symptoms will go away. Get medical help right away. Call your local emergency services (911 in the [place] Do not drive yourself to the hospital. If you have very low blood sugar and you cannot eat or drink, you may need a glucagon shot (injection). A family member or friend should learn how to check your blood sugar and how to give you a glucagon shot. Ask your doctor if you need to have a glucagon shot kit at home. What else is important to manage my diabetes? Medicine  Follow these instructions about insulin and diabetes medicines:  Take them as told by your doctor.  Adjust them as told by your doctor.  Do not run out of them.  Having diabetes can raise your risk for other long-term conditions. These include heart or kidney disease. Your doctor may prescribe medicines to help prevent problems from diabetes. Food   Make healthy food choices. These include: ? Chicken, fish, egg whites, and beans. ? Oats, whole wheat, bulgur, brown rice, quinoa, and millet. ? Fresh fruits and vegetables. ? Low-fat dairy products. ? Nuts,  avocado, olive oil, and canola oil.  Make a food plan with a specialist (dietitian).  Follow instructions from your doctor about what you cannot eat or drink.  Drink enough fluid to keep your pee (urine) clear or pale yellow.  Eat healthy snacks between healthy meals.  Keep track of carbs that you eat. Read food labels. Learn food serving sizes.  Follow your sick day plan when you cannot eat or drink normally. Make this plan with your doctor so it is ready to use. Activity  Exercise at least 3 times a week.  Do not go more than 2 days without exercising.  Talk with your doctor before you start a new exercise. Your doctor may need to adjust your insulin, medicines, or food. Lifestyle   Do not use any tobacco products. These include cigarettes, chewing tobacco, and e-cigarettes.If you need help quitting, ask your doctor.  Ask your doctor how much alcohol is safe for you.  Learn to deal with stress. If you need help with this, ask your doctor. Body care  Stay up to date with your shots (immunizations).  Have your eyes and feet checked by a doctor as often as told.  Check your skin and feet every day. Check for cuts, bruises, redness, blisters, or sores.  Brush your teeth and gums two times a day.  Floss at least one time a day.  Go to the dentist least one time every 6 months.  Stay at a healthy weight. General instructions   Take over-the-counter and prescription medicines only as told by your doctor.  Share your diabetes care plan with: ? Your work or school. ? People you live with.  Check your pee (urine) for ketones: ? When you are sick. ? As told by your doctor.  Carry a card or wear jewelry that says that you have diabetes.  Ask your doctor: ? Do I need to meet with a diabetes educator? ? Where can I find a support group for people with diabetes?  Keep all follow-up visits as told by your doctor. This is important. Where to find more information: To  learn more about diabetes, visit:  American Diabetes Association: www.diabetes.org  American Association of Diabetes Educators: www.diabeteseducator.org/patient-resources  This information is not intended to replace advice given to you by your health care provider. Make sure you discuss any questions you have with your health care provider. Document Released: 07/23/2015 Document Revised: 09/06/2015 Document Reviewed: 05/04/2015 Elsevier Interactive Patient Education  2018 [place] de microalbmina (Microalbumin Test) POR QU ME DEBO REALIZAR ESTA [place]? La albmina es una protena del cuerpo que ayuda a regular la cantidad de agua [employer]. Cuando los riones filtran la sangre para [employer] residuos a travs de la orina, la albmina Futures trader en el torrente sanguneo. No obstante, ciertos tipos de enfermedades renales pueden hacer que la albmina pase de los vasos sanguneos daados a los riones y  la [place]. Cuando esto sucede, pueden detectarse pequeas cantidades de albmina (microalbmina, MA) en la orina. Este tipo de dao renal es una complicacin comn de la diabetes mellitus, en especial cuando el nivel de azcar en la sangre (glucosa) no ha [employer]. La prueba de MA tambin puede ayudar al mdico a diagnosticar otras enfermedades relacionadas, como las enfermedades cardiovasculares y la hipertensin. La prueba de MA a menudo se realiza junto con el clculo de los niveles de creatinina en la [place]. La creatinina es otro residuo que los riones filtran de la [place]. Se le puede hacer este anlisis en los siguientes casos:  Tiene diabetes y [place] signos de dao renal.  El mdico desea determinar qu tan bien se han controlado sus niveles de glucosa en la sangre en el transcurso de muchos aos.  El mdico desea determinar su funcin renal cuando los resultados de los dems anlisis relacionados son normales. QU TIPO DE MUESTRA SE TOMA? Se  requiere [place] de [place], que debe recolectarse en un recipiente estril que proporciona el laboratorio. [place]? Los valores de referencia son los valores saludables establecidos despus de Optometrist el anlisis a un grupo grande de personas sanas. Pueden variar [employer], laboratorios y hospitales. Es su responsabilidad retirar el resultado del [place]. Consulte en el laboratorio o en el departamento en el que fue realizado el estudio cundo y cmo podr [employer]. El valor de referencia para la MA es cualquier Landscape architect que 65m/l. El valor de referencia para la MA en comparacin con la creatinina es el siguiente:  Hombres: menos de 17 mg/g de creatinina.  Mujeres: menos de 25 mg/g de creatinina. QChewelah Los rMohawk Industriesdel anlisis de MMichiganmLexmark Internationalvalores de referencia pueden indicar muchas enfermedades. Estas pueden incluir lo siguiente:  Diabetes mellitus.  Diabetes mellitus controlada en forma inadecuada.  Mioglobulinuria.  Hemoglobinuria.  Proteinuria de Bence Jones.  Uso de medicamentos o frmacos que daan los riones.  Ateroesclerosis.  Anomalas en los niveles de grasa (lpidos) en la sangre.  Resistencia a la insulina.  Hipertensin arterial.  Infarto de miocardio. Hable con el mdico sMetLife las opciones de tratamiento y, si es necesario, la necesidad de rOptometristms eConley Hable con el mdico si tiene aGoodyear Tire Esta informacin no tiene cMarine scientistel consejo del mdico. Asegrese de hacerle al mdico cualquier pregunta que tenga. Document Released: 01/19/2013 Document Revised: 04/21/2014 Document Reviewed: 08/19/2013 Elsevier Interactive Patient Education  2018 EReynolds [employer]

## 2016-12-30 LAB — CMP AND LIVER
ALBUMIN: 4.7 g/dL (ref 3.5–5.5)
ALT: 10 IU/L (ref 0–32)
AST: 13 IU/L (ref 0–40)
Alkaline Phosphatase: 95 IU/L (ref 39–117)
BILIRUBIN TOTAL: 0.3 mg/dL (ref 0.0–1.2)
BILIRUBIN, DIRECT: 0.04 mg/dL (ref 0.00–0.40)
BUN: 9 mg/dL (ref 6–24)
CO2: 23 mmol/L (ref 20–29)
Calcium: 9.6 mg/dL (ref 8.7–10.2)
Chloride: 104 mmol/L (ref 96–106)
Creatinine, Ser: 0.67 mg/dL (ref 0.57–1.00)
GFR calc non Af Amer: 110 mL/min/{1.73_m2} (ref >59)
GFR, EST AFRICAN AMERICAN: 126 mL/min/{1.73_m2} (ref >59)
Glucose: 198 mg/dL — ABNORMAL HIGH (ref 65–99)
Potassium: 4.5 mmol/L (ref 3.5–5.2)
SODIUM: 141 mmol/L (ref 134–144)
TOTAL PROTEIN: 7.5 g/dL (ref 6.0–8.5)

## 2016-12-30 LAB — LIPID PANEL
CHOLESTEROL TOTAL: 223 mg/dL — AB (ref 100–199)
Chol/HDL Ratio: 3.7 ratio (ref 0.0–4.4)
HDL: 60 mg/dL (ref >39)
LDL Calculated: 149 mg/dL — ABNORMAL HIGH (ref 0–99)
Triglycerides: 69 mg/dL (ref 0–149)
VLDL CHOLESTEROL CAL: 14 mg/dL (ref 5–40)

## 2016-12-31 ENCOUNTER — Telehealth: Payer: Self-pay

## 2016-12-31 ENCOUNTER — Other Ambulatory Visit: Payer: Self-pay | Admitting: Family Medicine

## 2016-12-31 DIAGNOSIS — E782 Mixed hyperlipidemia: Secondary | ICD-10-CM

## 2016-12-31 MED ORDER — ATORVASTATIN CALCIUM 10 MG PO TABS: 10.0000 mg | ORAL_TABLET | Freq: Every day | ORAL | 2 refills | Status: DC

## 2016-12-31 NOTE — Telephone Encounter (Signed)
CMA call regarding lab results   Patient Verify DOB   Patient was aware and understood  

## 2016-12-31 NOTE — Telephone Encounter (Signed)
-----   Message from Lizbeth Bark, FNP sent at 12/31/2016  1:10 PM EDT ----- Kidney function normal Liver function normal Lipid levels were elevated. This can increase your risk of heart disease overtime. You will be prescribed atorvastatin to help lower risk. Start eating a diet low in saturated fat. Limit your intake of fried foods, red meats, and whole milk. Increase activity.  Recommend recheck in 3 months.

## 2017-01-13 ENCOUNTER — Ambulatory Visit: Payer: Self-pay | Attending: Family Medicine | Admitting: Pharmacist

## 2017-01-13 DIAGNOSIS — Z794 Long term (current) use of insulin: Secondary | ICD-10-CM | POA: Insufficient documentation

## 2017-01-13 DIAGNOSIS — E1165 Type 2 diabetes mellitus with hyperglycemia: Secondary | ICD-10-CM

## 2017-01-13 DIAGNOSIS — E119 Type 2 diabetes mellitus without complications: Secondary | ICD-10-CM | POA: Insufficient documentation

## 2017-01-13 MED ORDER — INSULIN NPH ISOPHANE & REGULAR (70-30) 100 UNIT/ML ~~LOC~~ SUSP: 17.0000 [IU] | Freq: Two times a day (BID) | SUBCUTANEOUS | 6 refills | Status: DC

## 2017-01-13 NOTE — Progress Notes (Signed)
    S:     Chief Complaint  Patient presents with  . Medication Management    Patient arrives in good spirits. Presents for diabetes evaluation, education, and management at the request of Arrie Senate. Patient was referred on 12/29/16.  Patient was last seen by Primary Care Provider on 12/29/16. Steward Drone [id number] a [employer] interpreter was used for the entirety of the visit.   Patient reports adherence with medications. However, she never increased her insulin dose as directed Current diabetes medications include: Novolin 70/30 17 units BID, metformin XL 2g daily.   Patient reports hypoglycemic events but does not have numbers to confirm that she was truly hypoglycemic. Will eat or drink something sweet and then feels better.   O:  Physical Exam   ROS   Lab Results  Component Value Date   HGBA1C 7.4 12/29/2016   There were no vitals filed for this visit.  Home fasting CBG: 100s, today was 140 due to stress (no log book or meter with her)  2 hour post-prandial/random CBG: none  A/P: Diabetes longstanding currently uncontrolled based on A1c of 7.4 but home CBGs sound to be at goal.. Patient reports hypoglycemic events and is able to verbalize appropriate hypoglycemia management plan. Patient reports adherence with medication. Control is suboptimal due to dietary indiscretion.  Reordered insulin 70/30 was 17 units BID since patient was not taking 19 units as instructed and has reported some hypoglycemic-like symptoms will on the 17 units BID. Continue all other medications as prescribed. Patient to check blood sugars at least twice daily and then bring her log book back to clinic at next visit so I can actually see what her numbers are. Patient to also check blood sugars when she feels like she is hypoglycemic. Information provided in Spanish on hypoglycemia.   Next A1C anticipated December 2018.    Written patient instructions provided.  Total time in face to face counseling 15  minutes.   Follow up in Pharmacist Clinic Visit in 4 weeks.   Patient seen with Theodoro Kos, PharmD Candidate

## 2017-01-13 NOTE — Patient Instructions (Addendum)
Thanks for coming to see   Continue your insulin at 17 units twice a day  Come back in 4 weeks with meter or log book   Hipoglucemia (Hypoglycemia) La hipoglucemia se produce cuando el nivel de azcar (glucosa) en la sangre es demasiado bajo. Los sntomas de la glucemia baja pueden incluir los siguientes:  Sentir que tiene lo siguiente: ? Apetito. ? Preocupacin o nervios (ansioso). ? Sudoracin y [employer]. ? Confusin. ? Mareos. ? Somnolencia. ? Nuseas.  Tener lo siguiente: ? Latidos cardacos acelerados. ? Dolor de [place]. ? Cambios en la visin. ? Una crisis de movimientos que no puede controlar (convulsiones). ? Pesadillas. ? Hormigueo y falta de sensibilidad (adormecimiento) alrededor de la boca, los labios o la [place].  Dificultades para hacer lo siguiente: ? Hablar. ? Prestar atencin (concentrarse). ? Moverse (coordinacin). ? Dormir.  Temblores.  Desmayos.  Molestarse con facilidad (irritabilidad). Las personas que tienen diabetes y las que no tienen la enfermedad pueden tener la glucemia baja. El nivel bajo de glucemia en la sangre puede ocurrir rpidamente y ser Engineer, maintenance (IT). Tratamiento de la glucemia baja Generalmente, el tratamiento de la glucemia baja consiste en ingerir de inmediato un alimento o una bebida que contengan azcar. Si puede pensar con claridad y tragar de manera segura, siga la regla 15/15, que consiste en lo siguiente:  Consumir 15gramos de un hidrato de carbono de accin rpida. Algunos hidratos de carbono de accin rpida son los siguientes: ? 1pomo de glucosa en gel. ? 3comprimidos de azcar (comprimidos de glucosa). ? 6 a 8unidades de caramelos duros. ? 4onzas (140m) de jugo de frutas. ? 4onzas (1247m de gaseosa comn (no diettica).  Contrlese la glucemia 1577mtos despus de ingerir el hidrato de carbono.  Si el nivel de glucosa en la sangre todava es igual o menor que 59m39m (3,9mmo4m), ingiera nuevamente  15gramos de un hidrato de carbono.  Si el nivel de glucosa en la sangre no supera los 59mg/53m3,9mmol/72mdespus de 3intentos, solicite ayuda de inmediato.  Ingiera una comida o una colacin en el transcurso de 1hora despus de que la glucemia se haya normalizado. Tratamiento de la glucemia muy baja Si el nivel de glucosa en la sangre es igual o menor que 54mg/dl9mmol/l)66mignifica que est muy bajo (hipoglucemia grave). Esto es una emergEngineer, maintenance (IT)re hasta que los sntomas desaparezcan. Solicite atencin mdica de inmediato. Comunquese con el servicio de emergencias de su localidad (911 en los [place]). No conduzca por sus propios medios hasta el Principal Financialivel de glucosa en la sangre muy bajo y no puede ingerir ningn alimento ni bebida, tal vez deba aplicarse una inyeccin de glucagn. Un familiar o un amigo deben aprender a controlarle la glucemia y a aplicarle una inyeccin de glucagn. Pregntele al mdico si debe tener un kit de inyecciones de glucagn en su casa. CUIDADOS EN EL HOGAR Instrucciones generales  Evite cualquier dieta que le impida ingerir la cantidad suficiente de comida. Hable con el mdico antes de comenzar una dieta nueva.  Tome los medicamentos de venta libre y los recetados solamente como se lo haya indicado el mdico.  Limite el consumo de alcohol a no ms de 1medida p49mda si es mujer y no est embarazadaSan Marinodas p38mda si es hombre. Una medida equivale a 12onzas de cerveza, 5onzas de vino o 1onzas de bebidas alcohlicas de alta graduacin.  Concurra a todas las visitas de control como se lo haya indicado el mdico. Esto  es importante. Si usted tiene diabetes:  Asegrese de [employer]ant de la hipoglucemia.  Siempre tenga a mano una fuente de azcar, como por ejemplo: ? Azcar. ? Comprimidos de azcar. ? Gel de glucosa. ? Jugo de frutas. ? Gaseosa comn (gaseosa que no sea diettica). ? Leche. ? Caramelos  duros. ? Miel.  Tome los medicamentos segn las indicaciones.  Siga el plan de ejercicios y de alimentacin. ? Coma a horario. No omita comidas. ? Siga el plan para los das de enfermedad cuando no pueda comer o beber normalmente. Arme este plan de antemano con el mdico.  Contrlese la glucemia con la frecuencia que le haya indicado el mdico. Contrlesela siempre antes y despus de hacer actividad fsica.  Comparta su plan de atencin de la diabetes con estas personas: ? Compaeros de trabajo o de la escuela. ? Aquellas con las que [place].  Hgase anlisis de orina para Product manager presencia de cetonas: ? Cuando est enfermo. ? Como se lo haya indicado el mdico.  Lleve consigo una tarjeta, o use un brazalete o una medalla que indiquen que es diabtico. Si tiene un nivel bajo de glucosa en la sangre debido a otras causas:  Contrlese la glucemia con la frecuencia que le haya indicado el mdico.  Siga las indicaciones del mdico respecto de lo que no puede comer o beber. SOLICITE AYUDA SI:  Tiene problemas para mantener el nivel de glucosa en la sangre dentro del rango indicado.  Tiene la glucemia baja con frecuencia. SOLICITE AYUDA DE INMEDIATO SI:  Contina teniendo sntomas despus de haber comido o ingerido algo con azcar.  La glucemia es igual o inferior a 63m/dl (326ml/dl).  Tiene una crisis de movimientos que no puIT consultant Se desmaya. Estos sntomas pueden inSales executiveNo espere hasta que los sntomas desaparezcan. Solicite atencin mdica de inmediato. Comunquese con el servicio de emergencias de su localidad (911 en los [place]). No conduzca por sus propios medios haPrincipal FinancialEsta informacin no tiene coMarine scientistl consejo del mdico. Asegrese de hacerle al mdico cualquier pregunta que tenga. Document Released: 05/03/2010 Document Revised: 07/23/2015 Document Reviewed: 05/04/2015 Elsevier Interactive Patient  Education  20[employer]

## 2017-02-10 ENCOUNTER — Ambulatory Visit: Payer: Self-pay | Attending: Family Medicine | Admitting: Pharmacist

## 2017-02-10 DIAGNOSIS — E1165 Type 2 diabetes mellitus with hyperglycemia: Secondary | ICD-10-CM | POA: Insufficient documentation

## 2017-02-10 DIAGNOSIS — Z794 Long term (current) use of insulin: Secondary | ICD-10-CM | POA: Insufficient documentation

## 2017-02-10 DIAGNOSIS — IMO0001 Reserved for inherently not codable concepts without codable children: Secondary | ICD-10-CM

## 2017-02-10 NOTE — Patient Instructions (Addendum)
Thanks for coming to see [place]!  Continue current medications   Follow up with [facility] in December for 3 month diabetes follow up    Recuento de carbohidratos para la diabetes mellitus en los adultos Carbohydrate Counting for Diabetes Mellitus, Adult El recuento de carbohidratos es un mtodo destinado a Midwife un registro de la cantidad de carbohidratos que se ingieren. La ingesta natural de carbohidratos aumenta la cantidad de azcar (glucosa) en la sangre. El recuento de la cantidad de carbohidratos que se ingieren sirve para que el nivel de glucosa sangunea (glucemia) permanezca dentro de los lmites normales, lo que ayuda a Pharmacologist la diabetes (diabetes mellitus) bajo control. Es importante saber la cantidad de carbohidratos que se pueden ingerir en cada comida sin correr Surveyor, minerals. Esto es Rachael Russo. Un especialista en alimentacin y nutricin (nutricionista certificado) puede ayudarlo a crear un plan de alimentacin y a calcular la cantidad de carbohidratos que debe ingerir en cada comida y colacin. Los siguientes alimentos incluyen carbohidratos:  Granos, como panes y cereales.  Frijoles secos y productos con soja.  Verduras con almidn, como papas, guisantes y maz.  Rachael Russo y jugos de frutas.  Leche y Dentist.  Dulces y colaciones, como pasteles, galletas, caramelos, papas fritas y refrescos.  Cmo se calculan los carbohidratos? Hay dos maneras de calcular los carbohidratos de los alimentos. Puede usar cualquiera de 1 Rachael Russo o Rachael Russo combinacin de [place]. Leer la etiqueta de informacin nutricional de los alimentos envasados La lista de informacin nutricional est incluida en las etiquetas de casi todas las bebidas y los alimentos envasados de los [place]. Incluye lo siguiente:  El tamao de la porcin.  Informacin sobre los nutrientes de cada porcin, incluidos los gramos (g) de carbohidratos por porcin.  Para usar la informacin  nutricional:  Decida cuntas porciones va a comer.  Multiplique la cantidad de porciones por el nmero de carbohidratos por porcin.  El resultado es la cantidad total de carbohidratos que comer.  Conocer los tamaos de las porciones estndar de otros alimentos Cuando coma alimentos que contienen carbohidratos que no estn envasados o no incluyen la informacin nutricional en la etiqueta, debe medir las porciones para poder calcular la cantidad de carbohidratos:  Mida los alimentos que comer con una balanza de alimentos o una taza medidora, si es necesario.  Decida cuntas porciones de Programmer, systems.  Multiplique el nmero de porciones por15. La mayora de los alimentos con alto contenido de carbohidratos contienen unos 15g de carbohidratos por porcin. ? Por ejemplo, si come 8onzas (170g) de fresas, habr comido 2porciones y 30g de carbohidratos (2porciones x 15g=30g).  En el caso de las comidas que contienen mezclas de ms de un alimento, como las sopas y los guisos, debe calcular los carbohidratos de cada alimento que se incluye.  La siguiente Rachael Russo tamaos de las porciones estndar de los alimentos corrientes con alto contenido de carbohidratos. Cada una de estas porciones tiene aproximadamente 15g de carbohidratos:  pan de hamburguesa o muffin ingls.  onza (15ml) de almbar.  onza (14g) de gelatina.  1rebanada de pan.  1tortilla de seispulgadas.  3onzas (85g) de arroz o pasta cocidos.  4onzas (113g) de frijoles secos cocidos.  4onzas (113g) de verduras con almidn, como guisantes, maz o papas.  4onzas (113g) de cereal caliente.  4onzas (113g) de pur de papas o de una papa grande al horno.  4onzas (113g) de frutas en lata o congeladas.  4onzas ( ) de jugo de frutas.  4a 6galletas.  6croquetas de pollo.  6onzas (170g) de cereales secos sin azcar.  6onzas (170g) de yogur descremado sin  ningn agregado o de yogur endulzado con edulcorante artificial.  8onzas ( ) de Rachael Russo.  8onzas (170g) de frutas frescas o una fruta pequea.  24onzas (680g) de palomitas de maz.  Ejemplo de recuento de carbohidratos Ejemplo de comida  3onzas (85g) de pechuga de pollo.  6onzas (170g) de arroz integral.  4onzas (113g) de maz.  8onzas ( ) de leche.  8onzas (170g) de fresas con crema batida sin azcar. Clculo de carbohidratos 1. Identifique los alimentos que contienen carbohidratos: ? Arroz. ? Maz. ? Leche. ? Rachael Russo. 2. Calcule cuntas porciones come de cada alimento: ? 2 porciones de arroz. ? 1 porcin de maz. ? 1 porcin de leche. ? 1 porcin de fresas. 3. Multiplique cada nmero de porciones por 15g: ? 2 porciones de arroz x 15 g = 30 g. ? 1 porcin de maz x 15 g = 15 g. ? 1 porcin de leche x 15 g = 15 g. ? 1 porcin de fresas x 15 g = 15 g. 4. Sume todas las cantidades para conocer el total de gramos de carbohidratos consumidos: ? 30g + 15g + 15g + 15g = 75g de carbohidratos en total. Esta informacin no tiene como fin reemplazar el consejo del mdico. Asegrese de hacerle al mdico cualquier pregunta que tenga. Document Released: 06/23/2011 Document Revised: 03/18/2016 Document Reviewed: 09/12/2015 Elsevier Interactive Patient Education  2018 Rachael Russo.  [name] Es importante que controle su nivel de azcar en la sangre (glucosa). El nivel de glucosa en sangre depende en gran medida de lo que usted come. Comer alimentos saludables en las cantidades [place] a lo largo del Futures trader, aproximadamente a la misma hora [employer], lo ayudar a Rachael Russo su nivel de Event organiser. Tambin puede ayudarlo a retrasar o Fish farm manager de la diabetes mellitus. Comer de [employer] Russo saludable incluso puede ayudarlo a Event organiser de presin arterial y a Barista o Pharmacologist un  peso saludable. Entre las recomendaciones generales para alimentarse y Water quality scientist los alimentos de forma saludable, se incluyen las siguientes:  Respetar las comidas principales y comer colaciones con regularidad. Evitar pasar largos perodos sin comer con el fin de perder peso.  Seguir una dieta que consista principalmente en alimentos de origen vegetal, como frutas, vegetales, frutos secos, legumbres y cereales integrales.  Utilizar mtodos de coccin a baja temperatura, como hornear, en lugar de mtodos de coccin a alta temperatura, como frer en abundante aceite. Trabaje con el nutricionista para aprender a Acupuncturist nutricional de las etiquetas de los alimentos. CMO PUEDEN AFECTARME LOS ALIMENTOS? Carbohidratos Los carbohidratos afectan el nivel de glucosa en sangre ms que cualquier otro tipo de alimento. El nutricionista lo ayudar a Rachael Russo cuntos carbohidratos puede consumir en cada comida y ensearle a contarlos. El recuento de carbohidratos es importante para mantener la glucosa en sangre en un nivel saludable, en especial si utiliza insulina o toma determinados medicamentos para la diabetes mellitus. Alcohol El alcohol puede provocar disminuciones sbitas de la glucosa en sangre (hipoglucemia), en especial si utiliza insulina o toma determinados medicamentos para la diabetes mellitus. La hipoglucemia es una afeccin que puede poner en peligro la vida. Los sntomas de la hipoglucemia (somnolencia, mareos y Administrator) son similares a los sntomas de haber consumido mucho alcohol. Si el mdico lo autoriza a beber alcohol, hgalo con moderacin y  siga estas pautas:  Las mujeres no deben beber ms de un trago por da, y los hombres no deben beber ms de dos tragos por Futures trader. Un trago es igual a: ? 12 onzas (355 ml) de cerveza ? 5 onzas de vino (150 ml) de vino ? 1,5onzas (45ml) de bebidas espirituosas  No beba con el estmago vaco.  Mantngase hidratado. Beba agua,  gaseosas dietticas o t helado sin azcar.  Las gaseosas comunes, los jugos y otros refrescos podran contener muchos carbohidratos y se Heritage manager. QU ALIMENTOS NO SE RECOMIENDAN? Cuando haga las elecciones de alimentos, es importante que recuerde que todos los alimentos son distintos. Algunos tienen menos nutrientes que otros por porcin, aunque podran tener la misma cantidad de caloras o carbohidratos. Es difcil darle al cuerpo lo que necesita cuando consume alimentos con menos nutrientes. Estos son algunos ejemplos de alimentos que debera evitar ya que contienen muchas caloras y carbohidratos, pero pocos nutrientes:  Rachael Russo trans (la mayora de los alimentos procesados incluyen grasas trans en la etiqueta de Informacin nutricional).  Gaseosas comunes.  Jugos.  Caramelos.  Dulces, como tortas, pasteles, rosquillas y [place].  Comidas fritas. QU ALIMENTOS PUEDO COMER? Consuma alimentos ricos en nutrientes, que nutrirn el cuerpo y lo mantendrn saludable. Los alimentos que debe comer tambin dependern de varios factores, como:  Las caloras que necesita.  Los medicamentos que toma.  Su peso.  El nivel de glucosa en Rachael Russo.  El Rachael Russo de presin arterial.  El nivel de colesterol. Debe consumir una amplia variedad de alimentos, por ejemplo:  Protenas. ? Cortes de [employer] Russo. ? Protenas con bajo contenido de grasas saturadas, como pescado, clara de huevo y frijoles. Evite las carnes procesadas.  Frutas y vegetales. ? [place] y Sports administrator que pueden ayudar a AGCO Russo niveles sanguneos de [place], como [place], mangos y batatas.  Productos lcteos. ? Elija productos lcteos sin grasa o con bajo contenido de [place], como [place], yogur y [employer].  Cereales, panes, pastas y arroz. ? Elija cereales integrales, como panes multicereales, avena en grano y arroz integral. Estos alimentos pueden ayudar a controlar la presin arterial.  Rachael Russo. ? Alimentos que  contengan grasas saludables, como frutos secos, Chartered certified accountant, aceite de [place], aceite de canola y pescado. TODOS LOS QUE PADECEN DIABETES MELLITUS TIENEN EL MISMO PLAN DE COMIDAS? Dado que todas las personas que padecen diabetes mellitus son distintas, no hay un solo plan de comidas que funcione para todos. Es muy importante que se rena con un nutricionista que lo ayudar a crear un plan de comidas adecuado para usted. Esta informacin no tiene Theme park manager el consejo del mdico. Asegrese de hacerle al mdico cualquier pregunta que tenga. Document Released: 07/08/2007 Document Revised: 04/21/2014 Document Reviewed: 02/25/2013 Elsevier Interactive Patient Education  2017 Rachael Russo.   Consejos para comer fuera de su casa si tiene diabetes (Tips for Eating Away From Home If You Have Diabetes) El control del nivel de glucemia, que tambin se conoce como azcar en la [place], puede ser un reto, que se complica an ms cuando uno no prepara sus propias comidas. Los siguientes consejos pueden ayudarlo a Rachael Russo la diabetes cuando come fuera de su casa. PLANIFICACIN Organcese si sabe que comer fuera de su casa:  Pregntele al mdico cmo sincronizar las comidas y el medicamento si est en tratamiento con insulina.  Haga una lista de restaurantes cercanos que ofrezcan opciones saludables. Si tienen un men que pueda leer en su casa, llvelo y planifique lo que pedir con  anticipacin.  Busque informacin en lnea del restaurante donde quiera comer. Muchos restaurantes de comida rpida y cadenas de restaurantes incluyen la informacin nutricional en lnea. Tenga en cuenta esta informacin para elegir las opciones ms saludables y calcular los carbohidratos de la comida.  Use un libro de recuento de carbohidratos o una aplicacin mvil para fijarse en el contenido de carbohidratos y el tamao de porcin de lo que desea comer.  Comience a Armed forces training and education officerconocer los tamaos de las porciones y a Public house managerreconocer  cuntas porciones hay en una unidad. Esto le permitir calcular la cantidad de carbohidratos que puede comer. ALIMENTOS LIBRES Un "alimento libre" es cualquier alimento o bebida que contenga menos de 5g de carbohidratos por porcin. Entre los alimentos libres, se incluyen los siguientes:  Muchos vegetales.  Huevos duros.  Nueces o semillas.  Aceitunas.  Quesos.  Carnes. Estos tipos de alimentos son buenas opciones de bocadillos y en general estn disponibles en los bufs de ensaladas. Como aderezos "libres" para Penceensaladas, puede usarse jugo de limn, vinagre o un aderezo de bajas caloras (con menos de 20caloras por porcin). OPCIONES PARA REDUCIR LOS CARBOHIDRATOS  Reemplace el yogur descremado endulzado por el yogur sin azcar. Tambin puede consumir yogur a base de [place]enn[place]leche de [place] Valleysoja, pero es conveniente una opcin sin azcar o natural, porque tiene menos contenido de carbohidratos.  Pdale al mozo que retire la canasta de pan o las papas de la mesa.  Pida frutas frescas. El buf de ensaladas a menudo ofrece frutas frescas. Evite las frutas enlatadas, ya que por lo general tienen azcar o almbar.  Pida una ensalada y cmala sin aderezo. Tambin puede crear un aderezo "libre" para ensaladas.  Pida que le Liberty Mediareemplacen los alimentos. Por ejemplo, en lugar de papas fritas, pida una porcin de vegetales, como una ensalada, judas verdes o brcoli. OTROS CONSEJOS  Si [place]a insulina, adminstrela una vez que la comida llegue a la mesa, as las Automotive engineersincronizar correctamente.  Pregntele al mozo sobre el tamao de la porcin antes de pedir la comida y, si la porcin es ms grande de lo que usted debe consumir, pida una caja para llevarse la comida a su casa. Cuando llegue la comida, deje en el plato la cantidad que debe comer y coloque el resto en la caja para llevar.  Considere la posibilidad de Agricultural consultantcompartir un plato principal con alguien y de pedir una ensalada como guarnicin. Esta informacin no  tiene Theme park managercomo fin reemplazar el consejo del mdico. Asegrese de hacerle al mdico cualquier pregunta que tenga. Document Released: 03/31/2005 Document Revised: 07/23/2015 Document Reviewed: 06/28/2013 Elsevier Interactive Patient Education  [employer]2018 Elsevier Inc.

## 2017-02-10 NOTE — Progress Notes (Signed)
    S:     Chief Complaint  Patient presents with  . Medication Management    Patient arrives in good spirits. Presents for diabetes evaluation, education, and management at the request of Arrie SenateMandesia Hairston. Patient was referred on 12/29/16.  Patient was last seen by Primary Care Provider on 12/29/16. Steward DroneBrenda [phone] a [place] interpreter was used for the entirety of the visit.   Patient reports adherence with medications.  Current diabetes medications include: Novolin 70/30 17 units BID, metformin XL 2g daily.   Patient denies hypoglycemia.  Patient laughs when diet was addressed.   Patient would like to know why she gets Humulin 70/30 sometimes and Novolin 70/30 others at the pharmacy.    O:  Physical Exam   ROS   Lab Results  Component Value Date   HGBA1C 7.4 12/29/2016   There were no vitals filed for this visit.  Home fasting CBG: 78-143 (most <120) 2 hour post-prandial/random CBG: 106-211 (most <180)  A/P: Diabetes longstanding currently uncontrolled based on A1c of 7.4 but home CBGs are mostly at goal. Patient reports hypoglycemic events and is able to verbalize appropriate hypoglycemia management plan. Patient reports adherence with medication. Control is suboptimal due to dietary indiscretion.  Continue Novolin 70/30 17 units BID. Any increase would increase her risk of hypoglycemia. Patient will try to make some dietary changes to get her CBGs to goal. Extensive written information in Spanish given on dietary changes and exercise. Discussed that Humulin and Novolin are interchangeable and that it depends on what the pharmacy has.   Next A1C anticipated December 2018.    Written patient instructions provided.Total time in face to face counseling 15 minutes.   Follow up with PCP in December for 3 month follow up for diabetes.   Patient seen with Theodoro Kosasheed Mohammed, PharmD Candidate

## 2017-03-20 ENCOUNTER — Other Ambulatory Visit: Payer: Self-pay | Admitting: Family Medicine

## 2017-03-20 DIAGNOSIS — R809 Proteinuria, unspecified: Secondary | ICD-10-CM

## 2017-03-20 DIAGNOSIS — E782 Mixed hyperlipidemia: Secondary | ICD-10-CM

## 2017-05-05 ENCOUNTER — Ambulatory Visit: Payer: Self-pay | Attending: Family Medicine | Admitting: Family Medicine

## 2017-05-05 ENCOUNTER — Encounter: Payer: Self-pay | Admitting: Family Medicine

## 2017-05-05 VITALS — BP 114/68 | HR 76 | Temp 97.6°F | Resp 18 | Ht 63.0 in | Wt 157.0 lb

## 2017-05-05 DIAGNOSIS — E782 Mixed hyperlipidemia: Secondary | ICD-10-CM

## 2017-05-05 DIAGNOSIS — Z23 Encounter for immunization: Secondary | ICD-10-CM

## 2017-05-05 DIAGNOSIS — Z8639 Personal history of other endocrine, nutritional and metabolic disease: Secondary | ICD-10-CM

## 2017-05-05 DIAGNOSIS — Z79899 Other long term (current) drug therapy: Secondary | ICD-10-CM | POA: Insufficient documentation

## 2017-05-05 DIAGNOSIS — E11649 Type 2 diabetes mellitus with hypoglycemia without coma: Secondary | ICD-10-CM | POA: Insufficient documentation

## 2017-05-05 DIAGNOSIS — Z794 Long term (current) use of insulin: Secondary | ICD-10-CM

## 2017-05-05 DIAGNOSIS — E1165 Type 2 diabetes mellitus with hyperglycemia: Secondary | ICD-10-CM

## 2017-05-05 DIAGNOSIS — E0500 Thyrotoxicosis with diffuse goiter without thyrotoxic crisis or storm: Secondary | ICD-10-CM | POA: Insufficient documentation

## 2017-05-05 LAB — GLUCOSE, POCT (MANUAL RESULT ENTRY): POC GLUCOSE: 116 mg/dL — AB (ref 70–99)

## 2017-05-05 LAB — POCT GLYCOSYLATED HEMOGLOBIN (HGB A1C): HEMOGLOBIN A1C: 7.1

## 2017-05-05 MED ORDER — INSULIN NPH ISOPHANE & REGULAR (70-30) 100 UNIT/ML ~~LOC~~ SUSP: 17.0000 [IU] | Freq: Two times a day (BID) | SUBCUTANEOUS | 5 refills | Status: DC

## 2017-05-05 MED ORDER — METFORMIN HCL ER 500 MG PO TB24: ORAL_TABLET | ORAL | 5 refills | Status: DC

## 2017-05-05 MED ORDER — LISINOPRIL 2.5 MG PO TABS: 2.5000 mg | ORAL_TABLET | Freq: Every day | ORAL | 2 refills | Status: DC

## 2017-05-05 NOTE — Progress Notes (Signed)
 Subjective:  Patient ID: Rachael Russo, female    DOB: 03/19/1976  Age: 42 y.o. MRN: [id number]  CC: Diabetes   HPI [name] presents for   Diabetes  Disease Monitoring  Blood Sugar Ranges: Checking BID [phone]  Polyuria: no   Visual problems: no   Medication Compliance: yes  Medication Side Effects  Hypoglycemia: sometimes at night   Preventitive Health Care  Eye Exam: not within the last year.   Foot Exam: within the last year  Diet pattern: she reports making only minor changes to diet.  Exercise: no   History of Graves Disease She is adherent with methimazole use. She denies fatigue, weight changes, heat/cold intolerance, bowel/skin changes or CVS symptoms.  Previous thyroid studies include TSH, T4 free, and T3 free.      Outpatient Medications Prior to Visit  Medication Sig Dispense Refill  . atorvastatin (LIPITOR) 10 MG tablet TAKE 1 TABLET BY MOUTH DAILY. 30 tablet 2  . Blood Glucose Monitoring Suppl (TRUE METRIX METER) w/Device KIT 1 each by Does not apply route 3 (three) times daily. 1 kit 0  . glucose blood (TRUE METRIX BLOOD GLUCOSE TEST) test strip 1 each by Other route 3 (three) times daily. 100 each 11  . glucose blood test strip Check blood sugars twice a day. 100 each 11  . methimazole (TAPAZOLE) 10 MG tablet Take 1.5 tablets (15 mg total) by mouth daily. 45 tablet 11  . metoprolol succinate (TOPROL-XL) 25 MG 24 hr tablet Take 1 tablet (25 mg total) by mouth daily. 30 tablet 11  . Prenatal Vit-Fe Fumarate-FA (PRENATAL MULTIVITAMIN) TABS tablet Take 1 tablet by mouth daily. 30 tablet 2  . TRUEPLUS LANCETS 28G MISC 1 each by Does not apply route 2 (two) times daily. 100 each 12  . insulin NPH-regular Human (NOVOLIN 70/30) (70-30) 100 UNIT/ML injection Inject 17 Units into the skin 2 (two) times daily with a meal. 10 mL 6  . lisinopril (PRINIVIL,ZESTRIL) 2.5 MG tablet TAKE 1 TABLET (2.5 MG TOTAL) BY MOUTH DAILY. 30 tablet 2  . metFORMIN (GLUCOPHAGE-XR) 500  MG 24 hr tablet TAKE 4 TABLETS (2,000 MG TOTAL) BY MOUTH DAILY AFTER SUPPER 120 tablet 5   No facility-administered medications prior to visit.     ROS Review of Systems  Constitutional: Negative.   Eyes: Negative for visual disturbance.  Respiratory: Negative.   Cardiovascular: Negative.   Gastrointestinal: Negative.   Endocrine: Negative.   Skin: Negative.   Neurological: Negative.    Objective:  BP 114/68 (BP Location: Left Arm, Patient Position: Sitting, Cuff Size: Normal)   Pulse 76   Temp 97.6 F (36.4 C) (Oral)   Resp 18   Ht 5' 3" (1.6 m)   Wt 157 lb (71.2 kg)   LMP 04/28/2017   SpO2 99%   BMI 27.81 kg/m   BP/Weight 05/05/2017 12/29/2016 09/26/2016  Systolic BP 114 123 139  Diastolic BP 68 78 81  Wt. (Lbs) 157 156.4 160.8  BMI 27.81 27.71 29.41   Physical Exam  Constitutional: She is oriented to person, place, and time. She appears well-developed and well-nourished.  Eyes: Conjunctivae are normal. Pupils are equal, round, and reactive to light.  Neck: Normal range of motion. Neck supple. No thyromegaly present.  Cardiovascular: Normal rate, regular rhythm, normal heart sounds and intact distal pulses.  Pulmonary/Chest: Effort normal and breath sounds normal.  Abdominal: Soft. Bowel sounds are normal. There is no tenderness.  Lymphadenopathy:    She has no cervical adenopathy.    Neurological: She is alert and oriented to person, place, and time.  Skin: Skin is warm and dry.  Psychiatric: She has a normal mood and affect.  Nursing note and vitals reviewed.   Assessment & Plan:   1. Controlled type 2 diabetes mellitus with hyperglycemia, with long-term current use of insulin ([place]) Discussed more attention to diet and incorporating exercise more regularly. - HgB A1c - Glucose (CBG) - Ambulatory referral to Ophthalmology - insulin NPH-regular Human (NOVOLIN 70/30) (70-30) 100 UNIT/ML injection; Inject 17 Units into the skin 2 (two) times daily with a meal.   Dispense: 10 mL; Refill: 5 - metFORMIN (GLUCOPHAGE-XR) 500 MG 24 hr tablet; TAKE 4 TABLETS (2,000 MG TOTAL) BY MOUTH DAILY AFTER SUPPER  Dispense: 120 tablet; Refill: 5 - lisinopril (PRINIVIL,ZESTRIL) 2.5 MG tablet; Take 1 tablet (2.5 mg total) by mouth daily.  Dispense: 30 tablet; Refill: 2  2. History of Graves' disease  - T3, Free; Future - T4, Free; Future - TSH; Future  3. Mixed hyperlipidemia  - Lipid Panel; Future - Hepatic Function Panel; Future  4. Needs flu shot  - Flu Vaccine QUAD 6+ mos PF IM (Fluarix Quad PF)    Follow-up: Return in about 3 months (around 08/03/2017) for DM/Thyroid disorder.   Alfonse Spruce FNP

## 2017-05-05 NOTE — Patient Instructions (Addendum)
Come back this week for walk in fasting labs.   Diabetes mellitus y nutricin Diabetes Mellitus and Nutrition Si sufre de diabetes (diabetes mellitus), es muy importante tener hbitos alimenticios saludables debido a que sus niveles de Psychologist, counsellingazcar en la sangre (glucosa) se ven afectados en gran medida por lo que come y bebe. [place] alimentos saludables en las cantidades Blodgettadecuadas, aproximadamente a la Smith Internationalmisma hora todos los das, [place] ayudar a:  Scientist, physiologicalControlar la glucemia.  Disminuir el riesgo de sufrir una enfermedad cardaca.  Mejorar la presin arterial.  BaristaAlcanzar o mantener un peso saludable.  Todas las personas que sufren de diabetes son diferentes y cada una tiene necesidades diferentes en cuanto a un plan de alimentacin. El mdico puede recomendarle que trabaje con un especialista en dietas y nutricin (nutricionista) para Tax adviserelaborar el mejor plan para usted. Su plan de alimentacin puede variar segn factores como:  Las caloras que necesita.  Los medicamentos que toma.  Su peso.  Sus niveles de glucemia, presin arterial y colesterol.  Su nivel de [place]inesactividad.  Otras afecciones que tenga, como enfermedades cardacas o renales.  Cmo me afectan los carbohidratos? Los carbohidratos afectan el nivel de glucemia ms que cualquier otro tipo de alimento. La ingesta de carbohidratos naturalmente aumenta la cantidad glucosa en la sangre. El recuento de carbohidratos es un mtodo destinado a Midwifellevar un registro de la cantidad de carbohidratos que se ingieren. El recuento de carbohidratos es importante para Pharmacologistmantener la glucemia a un nivel saludable, en especial si utiliza insulina o toma determinados medicamentos por va oral para la diabetes. Es importante saber la cantidad de carbohidratos que se pueden ingerir en cada comida sin correr Surveyor, mineralsningn riesgo. Esto es Government social research officerdiferente en cada persona. El nutricionista puede ayudarlo a calcular la cantidad de carbohidratos que debe ingerir en cada comida y  colacin. Los alimentos que contienen carbohidratos incluyen:  Pan, cereal, arroz, pasta y galletas.  Papas y maz.  Guisantes, frijoles y lentejas.  Leche y Dentistyogur.  Nils PyleFrutas y Sloveniajugo.  Postres, como pasteles, galletitas, helado y caramelos.  Cmo me afecta el alcohol? El alcohol puede provocar disminuciones sbitas de la glucemia (hipoglucemia), en especial si utiliza insulina o toma determinados medicamentos por va oral para la diabetes. La hipoglucemia es una afeccin potencialmente mortal. Los sntomas de la hipoglucemia (somnolencia, mareos y confusin) son similares a los sntomas de haber consumido demasiado alcohol. Si el mdico afirma que el alcohol es seguro para usted, siga estas pautas:  Limite el consumo de alcohol a no ms de 1 medida por da si es mujer y no est Orthoptistembarazada, y a 2 medidas si es hombre. Una medida equivale a 12oz (355ml) de cerveza, 5oz (148ml) de vino o 1oz (44ml) de bebidas de alta graduacin alcohlica.  No beba con el estmago vaco.  Mantngase hidratado con agua, gaseosas dietticas o t helado sin azcar.  Tenga en cuenta que las gaseosas comunes, los jugos y otros refrescos pueden contener mucha azcar y se deben contar como carbohidratos.  Consejos para seguir Consulting civil engineereste plan Leer las etiquetas de los alimentos  Comience por controlar el tamao de la porcin en la etiqueta. La cantidad de caloras, carbohidratos, grasas y otros nutrientes mencionados en la etiqueta se basan en una porcin del alimento. Muchos alimentos contienen ms de una porcin por envase.  Verifique la cantidad total de gramos (g) de carbohidratos totales en una porcin. Puede calcular la cantidad de porciones de carbohidratos al dividir el total de carbohidratos por 15. Por ejemplo, si un alimento  posee un total de 30g de carbohidratos, equivale a 2 porciones de carbohidratos.  Verifique la cantidad de gramos (g) de grasas saturadas y grasas trans en una porcin. Escoja  alimentos que no contengan grasa o que tengan un bajo contenido.  Controle la cantidad de miligramos (mg) de sodio en una porcin. La mayora de las personas deben limitar la ingesta de sodio total a menos de 2300mg  por Training and development officer.  Siempre consulte la informacin nutricional de los alimentos etiquetados como "con bajo contenido de grasa" o "sin grasa". Estos alimentos pueden ser ms altos en azcar agregada o en carbohidratos refinados y deben evitarse.  Hable con el nutricionista para identificar sus objetivos diarios en cuanto a los nutrientes mencionados en la etiqueta. De compras  Evite comprar alimentos procesados, enlatados o prehechos. Estos alimentos tienden a TEFL teacher cantidad de [place], sodio y azcar agregada.  Compre en la zona exterior de la tienda de comestibles. Esta incluye frutas y [employer], granos a granel, carnes frescas y productos lcteos frescos. Coccin  Utilice mtodos de coccin a baja temperatura, como hornear, en lugar de mtodos de coccin a alta temperatura, como frer en abundante aceite.  Cocine con aceites saludables, como el aceite de [place], canola o [place].  Evite cocinar con manteca, crema o carnes con alto contenido de grasa. Planificacin de las comidas  International [employer] comidas y las colaciones de forma regular, preferentemente a la misma hora todos [place]. Evite pasar largos perodos de tiempo sin comer.  Consuma alimentos ricos en fibra, como frutas frescas, verduras, frijoles y cereales integrales. Consulte al nutricionista sobre cuntas porciones de carbohidratos puede consumir en cada comida.  Consuma entre 4 y 6 onzas de protenas magras por da, como carnes [place], pollo, pescado, [employer] o tofu. 1 onza equivale a 1 onza de carne, pollo o pescado, 1 huevo, o 1/4 taza de tofu.  Coma algunos alimentos por da que contengan grasas saludables, como aguacates, frutos secos, semillas y pescado. Estilo de vida   Controle su nivel de glucemia con  regularidad.  Haga ejercicio al menos 20minutos, 5das o ms por semana, o como se lo haya indicado el mdico.  Tome los [employer] se lo haya indicado el mdico.  No consuma ningn producto que contenga nicotina o tabaco, como cigarrillos y Psychologist, sport and exercise. Si necesita ayuda para dejar de fumar, consulte al [employer] con un asesor o instructor en diabetes para identificar estrategias para controlar el estrs y cualquier desafo emocional y social. Cules son algunas de las preguntas que puedo hacerle a mi mdico?  Es necesario que me rena con Radio broadcast assistant en diabetes?  Es necesario que me rena con un nutricionista?  A qu nmero puedo llamar si tengo preguntas?  Cules son los mejores momentos para controlar la glucemia? Dnde encontrar ms informacin:  Asociacin Americana de la Diabetes (American Diabetes Association): diabetes.org/food-and-fitness/food  Academia de Nutricin y Information systems manager (Academy of Nutrition and Dietetics): PokerClues.dk  [place] Diabetes y [place] y Water quality scientist [facility] of Diabetes and Digestive and Kidney Diseases) ([place], NIH): ContactWire.be Resumen  Un plan de alimentacin saludable lo ayudar a Aeronautical engineer glucemia y Theatre manager un estilo de vida saludable.  Trabajar con un especialista en dietas y nutricin (nutricionista) puede ayudarlo a Insurance claims handler de alimentacin para usted.  Tenga en cuenta que los carbohidratos y el alcohol tienen efectos inmediatos en sus niveles de glucemia. Es importante contar los carbohidratos y consumir alcohol con  prudencia. Esta informacin no tiene Marine scientist el consejo del mdico. Asegrese de hacerle al mdico cualquier pregunta que tenga. Document Released: 07/08/2007 Document  Revised: 07/21/2016 Document Reviewed: 07/21/2016 Elsevier Interactive Patient Education  2018 [employer].

## 2017-05-06 ENCOUNTER — Ambulatory Visit: Payer: Self-pay | Attending: Family Medicine

## 2017-05-06 DIAGNOSIS — Z8639 Personal history of other endocrine, nutritional and metabolic disease: Secondary | ICD-10-CM | POA: Insufficient documentation

## 2017-05-06 DIAGNOSIS — E782 Mixed hyperlipidemia: Secondary | ICD-10-CM | POA: Insufficient documentation

## 2017-05-06 NOTE — Progress Notes (Signed)
Patient here for lab visit only 

## 2017-05-07 ENCOUNTER — Other Ambulatory Visit: Payer: Self-pay | Admitting: Family Medicine

## 2017-05-07 DIAGNOSIS — E782 Mixed hyperlipidemia: Secondary | ICD-10-CM

## 2017-05-07 DIAGNOSIS — E0500 Thyrotoxicosis with diffuse goiter without thyrotoxic crisis or storm: Secondary | ICD-10-CM

## 2017-05-07 LAB — LIPID PANEL
Chol/HDL Ratio: 2.4 ratio (ref 0.0–4.4)
Cholesterol, Total: 159 mg/dL (ref 100–199)
HDL: 65 mg/dL (ref >39)
LDL Calculated: 84 mg/dL (ref 0–99)
Triglycerides: 52 mg/dL (ref 0–149)
VLDL Cholesterol Cal: 10 mg/dL (ref 5–40)

## 2017-05-07 LAB — T4, FREE: FREE T4: 0.99 ng/dL (ref 0.82–1.77)

## 2017-05-07 LAB — HEPATIC FUNCTION PANEL
ALBUMIN: 4.9 g/dL (ref 3.5–5.5)
ALT: 12 IU/L (ref 0–32)
AST: 11 IU/L (ref 0–40)
Alkaline Phosphatase: 98 IU/L (ref 39–117)
Bilirubin Total: 0.2 mg/dL (ref 0.0–1.2)
Bilirubin, Direct: 0.08 mg/dL (ref 0.00–0.40)
Total Protein: 7.3 g/dL (ref 6.0–8.5)

## 2017-05-07 LAB — T3, FREE: T3 FREE: 2.5 pg/mL (ref 2.0–4.4)

## 2017-05-07 LAB — TSH: TSH: 2.78 u[IU]/mL (ref 0.450–4.500)

## 2017-05-07 MED ORDER — METHIMAZOLE 10 MG PO TABS: 15.0000 mg | ORAL_TABLET | Freq: Every day | ORAL | 6 refills | Status: DC

## 2017-05-07 MED ORDER — ATORVASTATIN CALCIUM 10 MG PO TABS: 10.0000 mg | ORAL_TABLET | Freq: Every day | ORAL | 5 refills | Status: DC

## 2017-05-13 ENCOUNTER — Ambulatory Visit: Payer: Self-pay | Admitting: Family Medicine

## 2017-05-14 ENCOUNTER — Telehealth (INDEPENDENT_AMBULATORY_CARE_PROVIDER_SITE_OTHER): Payer: Self-pay | Admitting: *Deleted

## 2017-05-14 NOTE — Telephone Encounter (Signed)
-----   Message from Lizbeth BarkMandesia R Hairston, FNP sent at 05/07/2017  1:37 PM EST ----- Thyroid function normal on current methimazole dosage. Liver function normal Cholesterol levels normal. Continue atorvastatin.

## 2017-05-14 NOTE — Telephone Encounter (Signed)
Medical Assistant used [employer] to contact patient.  Interpreter Name: Sharyne Richtersdrian Interpreter #: [phone] Patient verified DOB Patient is ware of thyroid, liver and cholesterol being normal. Continue current dosage of methimazole and atorvastatin. No further questions.

## 2017-05-19 ENCOUNTER — Ambulatory Visit: Payer: Self-pay | Attending: Family Medicine

## 2017-05-22 ENCOUNTER — Other Ambulatory Visit: Payer: Self-pay | Admitting: Pharmacist

## 2017-05-22 DIAGNOSIS — E0500 Thyrotoxicosis with diffuse goiter without thyrotoxic crisis or storm: Secondary | ICD-10-CM

## 2017-05-22 MED ORDER — METOPROLOL SUCCINATE ER 25 MG PO TB24
25.0000 mg | ORAL_TABLET | Freq: Every day | ORAL | 3 refills | Status: DC
Start: 2017-05-22 — End: 2017-05-25

## 2017-05-25 ENCOUNTER — Other Ambulatory Visit: Payer: Self-pay | Admitting: Family Medicine

## 2017-05-25 DIAGNOSIS — Z760 Encounter for issue of repeat prescription: Secondary | ICD-10-CM

## 2017-05-25 DIAGNOSIS — E0500 Thyrotoxicosis with diffuse goiter without thyrotoxic crisis or storm: Secondary | ICD-10-CM

## 2017-05-25 MED ORDER — METOPROLOL SUCCINATE ER 25 MG PO TB24: 25.0000 mg | ORAL_TABLET | Freq: Every day | ORAL | 2 refills | Status: DC

## 2017-08-03 ENCOUNTER — Ambulatory Visit: Payer: Self-pay | Admitting: Family Medicine

## 2017-08-04 ENCOUNTER — Ambulatory Visit: Payer: Self-pay | Admitting: Family Medicine

## 2017-08-20 ENCOUNTER — Ambulatory Visit: Payer: Self-pay | Attending: Family Medicine | Admitting: Family Medicine

## 2017-08-20 ENCOUNTER — Encounter: Payer: Self-pay | Admitting: Family Medicine

## 2017-08-20 VITALS — BP 117/72 | HR 85 | Temp 97.9°F | Ht 63.0 in | Wt 158.0 lb

## 2017-08-20 DIAGNOSIS — E0500 Thyrotoxicosis with diffuse goiter without thyrotoxic crisis or storm: Secondary | ICD-10-CM | POA: Insufficient documentation

## 2017-08-20 DIAGNOSIS — E782 Mixed hyperlipidemia: Secondary | ICD-10-CM | POA: Insufficient documentation

## 2017-08-20 DIAGNOSIS — Z79899 Other long term (current) drug therapy: Secondary | ICD-10-CM | POA: Insufficient documentation

## 2017-08-20 DIAGNOSIS — E1165 Type 2 diabetes mellitus with hyperglycemia: Secondary | ICD-10-CM | POA: Insufficient documentation

## 2017-08-20 DIAGNOSIS — R200 Anesthesia of skin: Secondary | ICD-10-CM | POA: Insufficient documentation

## 2017-08-20 DIAGNOSIS — Z794 Long term (current) use of insulin: Secondary | ICD-10-CM | POA: Insufficient documentation

## 2017-08-20 DIAGNOSIS — I10 Essential (primary) hypertension: Secondary | ICD-10-CM | POA: Insufficient documentation

## 2017-08-20 LAB — GLUCOSE, POCT (MANUAL RESULT ENTRY): POC Glucose: 104 mg/dl — AB (ref 70–99)

## 2017-08-20 LAB — POCT GLYCOSYLATED HEMOGLOBIN (HGB A1C): HEMOGLOBIN A1C: 7.6

## 2017-08-20 MED ORDER — ATORVASTATIN CALCIUM 10 MG PO TABS: 10.0000 mg | ORAL_TABLET | Freq: Every day | ORAL | 5 refills | Status: DC

## 2017-08-20 MED ORDER — LISINOPRIL 2.5 MG PO TABS: 2.5000 mg | ORAL_TABLET | Freq: Every day | ORAL | 5 refills | Status: DC

## 2017-08-20 MED ORDER — METHIMAZOLE 5 MG PO TABS: 5.0000 mg | ORAL_TABLET | Freq: Three times a day (TID) | ORAL | 5 refills | Status: DC

## 2017-08-20 MED ORDER — METFORMIN HCL ER 500 MG PO TB24: 1000.0000 mg | ORAL_TABLET | Freq: Two times a day (BID) | ORAL | 5 refills | Status: DC

## 2017-08-20 MED ORDER — INSULIN NPH ISOPHANE & REGULAR (70-30) 100 UNIT/ML ~~LOC~~ SUSP: 17.0000 [IU] | Freq: Two times a day (BID) | SUBCUTANEOUS | 5 refills | Status: DC

## 2017-08-20 MED ORDER — METOPROLOL SUCCINATE ER 25 MG PO TB24
25.0000 mg | ORAL_TABLET | Freq: Every day | ORAL | 5 refills | Status: DC
Start: 2017-08-20 — End: 2017-11-24

## 2017-08-20 NOTE — Progress Notes (Signed)
Subjective:  Patient ID: Rachael Russo, female    DOB: Dec 22, 1975  Age: 42 y.o. MRN: [id number]  CC: Hypertension and Diabetes   HPI Rachael Russo is a 42 year old female with a history of type 2 diabetes mellitus (A1c 7.6), Graves' disease, hyperlipidemia who presents today to get established with me as she was previously followed by Rachael Beets, FNP. Her A1c is 7.6 which has trended up from 7.1 previously and she endorses forgetting to take her insulin on some occasions but has been adherent with a diabetic diet.  Denies visual concerns or hypoglycemia.She does have numbness in her right hand intermittently at nighttime but denies it at present and has no history of carpal tunnel tunnel syndrome and no history of trauma. She tolerates her thyroid medications and her statin with no complaints of myalgias or other adverse effects.  She denies voice hoarseness, diarrhea.  Past Medical History:  Diagnosis Date  . AMA (advanced maternal age) multigravida 35+ 05/09/2013   Offered genetic screening, discussed options, patient desires quad screen [facility] [place]  Dating LMP consistent with 10 week scan  Genetic Screen  Quad:                  NIPS:  Anatomic [place]   GTT Type II DM  TDaP vaccine   Flu vaccine   GBS   Baby Food Breast  Contraception Condoms  Pediatrician [place]  Support Person Mother      . AMA (advanced maternal age) multigravida 35+ 05/09/2013   Offered genetic screening, discussed options, patient desires quad screen [facility] [place]  Dating LMP consistent with 10 week scan  Genetic Screen  Quad normal  Anatomic [place] Limited, will reevaluate on repeat scans  GTT Type II DM  TDaP vaccine   Flu vaccine   GBS   Baby Food Breast  Contraception Condoms  Pediatrician [place]  Support Person Mother      . Diabetes mellitus   . LGA (large for gestational age) fetus affecting mother, antepartum 11/17/2013   11/16/2013 EFW 3747gm >90%tile     Past Surgical History:  Procedure  Laterality Date  . CESAREAN SECTION    . CESAREAN SECTION N/A 12/03/2013   Procedure: CESAREAN SECTION;  Surgeon: Rachael Filbert, MD;  Location: [facility];  Service: Obstetrics;  Laterality: N/A;    No Known Allergies  Outpatient Medications Prior to Visit  Medication Sig Dispense Refill  . Blood Glucose Monitoring Suppl (TRUE METRIX METER) w/Device KIT 1 each by Does not apply route 3 (three) times daily. 1 kit 0  . glucose blood (TRUE METRIX BLOOD GLUCOSE TEST) test strip 1 each by Other route 3 (three) times daily. 100 each 11  . glucose blood test strip Check blood sugars twice a day. 100 each 11  . Prenatal Vit-Fe Fumarate-FA (PRENATAL MULTIVITAMIN) TABS tablet Take 1 tablet by mouth daily. 30 tablet 2  . TRUEPLUS LANCETS 28G MISC 1 each by Does not apply route 2 (two) times daily. 100 each 12  . atorvastatin (LIPITOR) 10 MG tablet Take 1 tablet (10 mg total) by mouth daily. 30 tablet 5  . insulin NPH-regular Human (NOVOLIN 70/30) (70-30) 100 UNIT/ML injection Inject 17 Units into the skin 2 (two) times daily with a meal. 10 mL 5  . lisinopril (PRINIVIL,ZESTRIL) 2.5 MG tablet Take 1 tablet (2.5 mg total) by mouth daily. 30 tablet 2  . metFORMIN (GLUCOPHAGE-XR) 500 MG 24 hr tablet TAKE 4 TABLETS (2,000 MG TOTAL) BY MOUTH  DAILY AFTER SUPPER 120 tablet 5  . methimazole (TAPAZOLE) 10 MG tablet Take 1.5 tablets (15 mg total) by mouth daily. 45 tablet 6  . metoprolol succinate (TOPROL-XL) 25 MG 24 hr tablet Take 1 tablet (25 mg total) by mouth daily. 30 tablet 2   No facility-administered medications prior to visit.     ROS Review of Systems  Constitutional: Negative for activity change, appetite change and fatigue.  HENT: Negative for congestion, sinus pressure and sore throat.   Eyes: Negative for visual disturbance.  Respiratory: Negative for cough, chest tightness, shortness of breath and wheezing.   Cardiovascular: Negative for chest pain and palpitations.  Gastrointestinal: Negative for  abdominal distention, abdominal pain and constipation.  Endocrine: Negative for polydipsia.  Genitourinary: Negative for dysuria and frequency.  Musculoskeletal: Negative for arthralgias and back pain.  Skin: Negative for rash.  Neurological: Positive for numbness. Negative for tremors and light-headedness.  Hematological: Does not bruise/bleed easily.  Psychiatric/Behavioral: Negative for agitation and behavioral problems.    Objective:  BP 117/72   Pulse 85   Temp 97.9 F (36.6 C) (Oral)   Ht 5' 3"  (1.6 m)   Wt 158 lb (71.7 kg)   SpO2 99%   BMI 27.99 kg/m   BP/Weight 08/20/2017 05/05/2017 11/25/4816  Systolic BP 563 149 702  Diastolic BP 72 68 78  Wt. (Lbs) 158 157 156.4  BMI 27.99 27.81 27.71      Physical Exam  Constitutional: She is oriented to person, place, and time. She appears well-developed and well-nourished.  Cardiovascular: Normal rate, normal heart sounds and intact distal pulses.  No murmur heard. Pulmonary/Chest: Effort normal and breath sounds normal. She has no wheezes. She has no rales. She exhibits no tenderness.  Abdominal: Soft. Bowel sounds are normal. She exhibits no distension and no mass. There is no tenderness.  Musculoskeletal: Normal range of motion.  Negative Phalen's and Tinel signs  Neurological: She is alert and oriented to person, place, and time.  Skin: Skin is warm and dry.  Psychiatric: She has a normal mood and affect.     CMP Latest Ref Rng & Units 05/06/2017 12/29/2016 02/01/2016  Glucose 65 - 99 mg/dL - 198(H) 128(H)  BUN 6 - 24 mg/dL - 9 9  Creatinine 0.57 - 1.00 mg/dL - 0.67 0.75  Sodium 134 - 144 mmol/L - 141 138  Potassium 3.5 - 5.2 mmol/L - 4.5 4.2  Chloride 96 - 106 mmol/L - 104 102  CO2 20 - 29 mmol/L - 23 25  Calcium 8.7 - 10.2 mg/dL - 9.6 10.2  Total Protein 6.0 - 8.5 g/dL 7.3 7.5 -  Total Bilirubin 0.0 - 1.2 mg/dL 0.2 0.3 -  Alkaline Phos 39 - 117 IU/L 98 95 -  AST 0 - 40 IU/L 11 13 -  ALT 0 - 32 IU/L 12 10 -     Lipid Panel     Component Value Date/Time   CHOL 159 05/06/2017 1030   TRIG 52 05/06/2017 1030   HDL 65 05/06/2017 1030   CHOLHDL 2.4 05/06/2017 1030   CHOLHDL 3.7 02/01/2016 1703   VLDL 21 02/01/2016 1703   LDLCALC 84 05/06/2017 1030     Lab Results  Component Value Date   HGBA1C 7.6 08/20/2017     Assessment & Plan:   1. Mixed hyperlipidemia Controlled Low-cholesterol diet - atorvastatin (LIPITOR) 10 MG tablet; Take 1 tablet (10 mg total) by mouth daily.  Dispense: 30 tablet; Refill: 5  2. Controlled type 2  diabetes mellitus with hyperglycemia, with long-term current use of insulin (HCC) A1c of 7.6 which is greater than goal of less than 7 Advised to be more compliant with diabetic regimen and diabetic diet No regimen changes today and will reevaluate at next visit - POCT glucose (manual entry) - POCT glycosylated hemoglobin (Hb A1C)  lisinopril (PRINIVIL,ZESTRIL) 2.5 MG tablet; Take 1 tablet (2.5 mg total) by mouth daily.  Dispense: 30 tablet; Refill: 5 - metFORMIN (GLUCOPHAGE-XR) 500 MG 24 hr tablet; Take 2 tablets (1,000 mg total) by mouth 2 (two) times daily.  Dispense: 120 tablet; Refill: 5 - insulin NPH-regular Human (NOVOLIN 70/30) (70-30) 100 UNIT/ML injection; Inject 17 Units into the skin 2 (two) times daily with a meal.  Dispense: 10 mL; Refill: 5  3. Graves disease Stable Change methimazole from 15 mg daily to 5 mg 3 times daily - methimazole (TAPAZOLE) 5 MG tablet; Take 1 tablet (5 mg total) by mouth 3 (three) times daily.  Dispense: 90 tablet; Refill: 5 - metoprolol succinate (TOPROL-XL) 25 MG 24 hr tablet; Take 1 tablet (25 mg total) by mouth daily.  Dispense: 30 tablet; Refill: 5  4.  Numbness History is not in keeping with neuropathy Advised to use wrist brace when sleeping Could be mild underlying carpal tunnel Meds ordered this encounter  Medications  . atorvastatin (LIPITOR) 10 MG tablet    Sig: Take 1 tablet (10 mg total) by mouth daily.     Dispense:  30 tablet    Refill:  5  . lisinopril (PRINIVIL,ZESTRIL) 2.5 MG tablet    Sig: Take 1 tablet (2.5 mg total) by mouth daily.    Dispense:  30 tablet    Refill:  5  . metFORMIN (GLUCOPHAGE-XR) 500 MG 24 hr tablet    Sig: Take 2 tablets (1,000 mg total) by mouth 2 (two) times daily.    Dispense:  120 tablet    Refill:  5    Discontinue previous dose  . methimazole (TAPAZOLE) 5 MG tablet    Sig: Take 1 tablet (5 mg total) by mouth 3 (three) times daily.    Dispense:  90 tablet    Refill:  5    Discontinue previous dose  . metoprolol succinate (TOPROL-XL) 25 MG 24 hr tablet    Sig: Take 1 tablet (25 mg total) by mouth daily.    Dispense:  30 tablet    Refill:  5  . insulin NPH-regular Human (NOVOLIN 70/30) (70-30) 100 UNIT/ML injection    Sig: Inject 17 Units into the skin 2 (two) times daily with a meal.    Dispense:  10 mL    Refill:  5    Follow-up: Return in about 3 months (around 11/20/2017) for follow up of chronic medical conditions.   Charlott Rakes MD

## 2017-08-20 NOTE — Patient Instructions (Signed)
Diabetes mellitus y nutrición  Diabetes Mellitus and Nutrition  Si sufre de diabetes (diabetes mellitus), es muy importante tener hábitos alimenticios saludables debido a que sus niveles de azúcar en la sangre (glucosa) se ven afectados en gran medida por lo que come y bebe. Comer alimentos saludables en las cantidades adecuadas, aproximadamente a la misma hora todos los días, lo ayudará a:  · Controlar la glucemia.  · Disminuir el riesgo de sufrir una enfermedad cardíaca.  · Mejorar la presión arterial.  · Alcanzar o mantener un peso saludable.    Todas las personas que sufren de diabetes son diferentes y cada una tiene necesidades diferentes en cuanto a un plan de alimentación. El médico puede recomendarle que trabaje con un especialista en dietas y nutrición (nutricionista) para elaborar el mejor plan para usted. Su plan de alimentación puede variar según factores como:  · Las calorías que necesita.  · Los medicamentos que toma.  · Su peso.  · Sus niveles de glucemia, presión arterial y colesterol.  · Su nivel de actividad.  · Otras afecciones que tenga, como enfermedades cardíacas o renales.    ¿Cómo me afectan los carbohidratos?  Los carbohidratos afectan el nivel de glucemia más que cualquier otro tipo de alimento. La ingesta de carbohidratos naturalmente aumenta la cantidad glucosa en la sangre. El recuento de carbohidratos es un método destinado a llevar un registro de la cantidad de carbohidratos que se ingieren. El recuento de carbohidratos es importante para mantener la glucemia a un nivel saludable, en especial si utiliza insulina o toma determinados medicamentos por vía oral para la diabetes.  Es importante saber la cantidad de carbohidratos que se pueden ingerir en cada comida sin correr ningún riesgo. Esto es diferente en cada persona. El nutricionista puede ayudarlo a calcular la cantidad de carbohidratos que debe ingerir en cada comida y colación.   Los alimentos que contienen carbohidratos incluyen:  · Pan, cereal, arroz, pasta y galletas.  · Papas y maíz.  · Guisantes, frijoles y lentejas.  · Leche y yogur.  · Frutas y jugo.  · Postres, como pasteles, galletitas, helado y caramelos.    ¿Cómo me afecta el alcohol?  El alcohol puede provocar disminuciones súbitas de la glucemia (hipoglucemia), en especial si utiliza insulina o toma determinados medicamentos por vía oral para la diabetes. La hipoglucemia es una afección potencialmente mortal. Los síntomas de la hipoglucemia (somnolencia, mareos y confusión) son similares a los síntomas de haber consumido demasiado alcohol.  Si el médico afirma que el alcohol es seguro para usted, siga estas pautas:  · Limite el consumo de alcohol a no más de 1 medida por día si es mujer y no está embarazada, y a 2 medidas si es hombre. Una medida equivale a 12 oz (355 ml) de cerveza, 5 oz (148 ml) de vino o 1½ oz (44 ml) de bebidas de alta graduación alcohólica.  · No beba con el estómago vacío.  · Manténgase hidratado con agua, gaseosas dietéticas o té helado sin azúcar.  · Tenga en cuenta que las gaseosas comunes, los jugos y otros refrescos pueden contener mucha azúcar y se deben contar como carbohidratos.    Consejos para seguir este plan  Leer las etiquetas de los alimentos  · Comience por controlar el tamaño de la porción en la etiqueta. La cantidad de calorías, carbohidratos, grasas y otros nutrientes mencionados en la etiqueta se basan en una porción del alimento. Muchos alimentos contienen más de una porción por envase.  · Verifique la cantidad total de gramos (g)   de carbohidratos totales en una porción. Puede calcular la cantidad de porciones de carbohidratos al dividir el total de carbohidratos por 15. Por ejemplo, si un alimento posee un total de 30 g de carbohidratos, equivale a 2 porciones de carbohidratos.  · Verifique la cantidad de gramos (g) de grasas saturadas y grasas trans  en una porción. Escoja alimentos que no contengan grasa o que tengan un bajo contenido.  · Controle la cantidad de miligramos (mg) de sodio en una porción. La mayoría de las personas deben limitar la ingesta de sodio total a menos de 2300 mg por día.  · Siempre consulte la información nutricional de los alimentos etiquetados como “con bajo contenido de grasa” o “sin grasa”. Estos alimentos pueden ser más altos en azúcar agregada o en carbohidratos refinados y deben evitarse.  · Hable con el nutricionista para identificar sus objetivos diarios en cuanto a los nutrientes mencionados en la etiqueta.  De compras  · Evite comprar alimentos procesados, enlatados o prehechos. Estos alimentos tienden a tener mayor cantidad de grasa, sodio y azúcar agregada.  · Compre en la zona exterior de la tienda de comestibles. Esta incluye frutas y vegetales frescos, granos a granel, carnes frescas y productos lácteos frescos.  Cocción  · Utilice métodos de cocción a baja temperatura, como hornear, en lugar de métodos de cocción a alta temperatura, como freír en abundante aceite.  · Cocine con aceites saludables, como el aceite de oliva, canola o girasol.  · Evite cocinar con manteca, crema o carnes con alto contenido de grasa.  Planificación de las comidas  · Consuma las comidas y las colaciones de forma regular, preferentemente a la misma hora todos los días. Evite pasar largos períodos de tiempo sin comer.  · Consuma alimentos ricos en fibra, como frutas frescas, verduras, frijoles y cereales integrales. Consulte al nutricionista sobre cuántas porciones de carbohidratos puede consumir en cada comida.  · Consuma entre 4 y 6 onzas de proteínas magras por día, como carnes magras, pollo, pescado, huevos o tofu. 1 onza equivale a 1 onza de carne, pollo o pescado, 1 huevo, o 1/4 taza de tofu.  · Coma algunos alimentos por día que contengan grasas saludables, como aguacates, frutos secos, semillas y pescado.  Estilo de vida     · Controle su nivel de glucemia con regularidad.  · Haga ejercicio al menos 30 minutos, 5 días o más por semana, o como se lo haya indicado el médico.  · Tome los medicamentos como se lo haya indicado el médico.  · No consuma ningún producto que contenga nicotina o tabaco, como cigarrillos y cigarrillos electrónicos. Si necesita ayuda para dejar de fumar, consulte al médico.  · Trabaje con un asesor o instructor en diabetes para identificar estrategias para controlar el estrés y cualquier desafío emocional y social.  ¿Cuáles son algunas de las preguntas que puedo hacerle a mi médico?  · ¿Es necesario que me reúna con un instructor en diabetes?  · ¿Es necesario que me reúna con un nutricionista?  · ¿A qué número puedo llamar si tengo preguntas?  · ¿Cuáles son los mejores momentos para controlar la glucemia?  Dónde encontrar más información:  · Asociación Americana de la Diabetes (American Diabetes Association): diabetes.org/food-and-fitness/food  · Academia de Nutrición y Dietética (Academy of Nutrition and Dietetics): www.eatright.org/resources/health/diseases-and-conditions/diabetes  · Instituto Nacional de la Diabetes y las Enfermedades Digestivas y Renales (National Institute of Diabetes and Digestive and Kidney Diseases) (Institutos Nacionales de Salud, NIH): www.niddk.nih.gov/health-information/diabetes/overview/diet-eating-physical-activity  Resumen  · Un plan de alimentación saludable   lo ayudará a controlar la glucemia y mantener un estilo de vida saludable.  · Trabajar con un especialista en dietas y nutrición (nutricionista) puede ayudarlo a elaborar el mejor plan de alimentación para usted.  · Tenga en cuenta que los carbohidratos y el alcohol tienen efectos inmediatos en sus niveles de glucemia. Es importante contar los carbohidratos y consumir alcohol con prudencia.  Esta información no tiene como fin reemplazar el consejo del médico. Asegúrese de hacerle al médico cualquier pregunta que tenga.   Document Released: 07/08/2007 Document Revised: 07/21/2016 Document Reviewed: 07/21/2016  Elsevier Interactive Patient Education © 2018 Elsevier Inc.

## 2017-11-24 ENCOUNTER — Ambulatory Visit: Payer: Self-pay | Attending: Family Medicine | Admitting: Family Medicine

## 2017-11-24 ENCOUNTER — Encounter: Payer: Self-pay | Admitting: Family Medicine

## 2017-11-24 VITALS — BP 108/72 | HR 82 | Temp 98.2°F | Wt 156.2 lb

## 2017-11-24 DIAGNOSIS — Z79899 Other long term (current) drug therapy: Secondary | ICD-10-CM | POA: Insufficient documentation

## 2017-11-24 DIAGNOSIS — E7800 Pure hypercholesterolemia, unspecified: Secondary | ICD-10-CM

## 2017-11-24 DIAGNOSIS — Z794 Long term (current) use of insulin: Secondary | ICD-10-CM | POA: Insufficient documentation

## 2017-11-24 DIAGNOSIS — E0500 Thyrotoxicosis with diffuse goiter without thyrotoxic crisis or storm: Secondary | ICD-10-CM | POA: Insufficient documentation

## 2017-11-24 DIAGNOSIS — E1165 Type 2 diabetes mellitus with hyperglycemia: Secondary | ICD-10-CM | POA: Insufficient documentation

## 2017-11-24 DIAGNOSIS — E782 Mixed hyperlipidemia: Secondary | ICD-10-CM | POA: Insufficient documentation

## 2017-11-24 DIAGNOSIS — E785 Hyperlipidemia, unspecified: Secondary | ICD-10-CM | POA: Insufficient documentation

## 2017-11-24 LAB — POCT GLYCOSYLATED HEMOGLOBIN (HGB A1C): HbA1c, POC (controlled diabetic range): 7.1 % — AB (ref 0.0–7.0)

## 2017-11-24 LAB — GLUCOSE, POCT (MANUAL RESULT ENTRY): POC GLUCOSE: 153 mg/dL — AB (ref 70–99)

## 2017-11-24 MED ORDER — ATORVASTATIN CALCIUM 10 MG PO TABS: 10.0000 mg | ORAL_TABLET | Freq: Every day | ORAL | 5 refills | Status: DC

## 2017-11-24 MED ORDER — METOPROLOL SUCCINATE ER 25 MG PO TB24: 25.0000 mg | ORAL_TABLET | Freq: Every day | ORAL | 5 refills | Status: DC

## 2017-11-24 MED ORDER — LISINOPRIL 2.5 MG PO TABS: 2.5000 mg | ORAL_TABLET | Freq: Every day | ORAL | 5 refills | Status: DC

## 2017-11-24 MED ORDER — INSULIN NPH ISOPHANE & REGULAR (70-30) 100 UNIT/ML ~~LOC~~ SUSP: 17.0000 [IU] | Freq: Two times a day (BID) | SUBCUTANEOUS | 5 refills | Status: DC

## 2017-11-24 MED ORDER — METFORMIN HCL ER 500 MG PO TB24: 1000.0000 mg | ORAL_TABLET | Freq: Two times a day (BID) | ORAL | 5 refills | Status: DC

## 2017-11-24 NOTE — Progress Notes (Signed)
Subjective:  Patient ID: Rachael Russo, female    DOB: 10-03-1975  Age: 42 y.o. MRN: [id number]  CC: Diabetes   HPI Rachael Russo is a 42 year old female with a history of type 2 diabetes mellitus (A1c 7.1), Graves' disease, hyperlipidemia here for follow-up visit. She is compliant with her diabetic diet and her medications and denies episodes of hypoglycemia's, visual concerns or numbness in extremities.  Also tolerating her statin with no complaints of myalgias. She has been compliant with levothyroxine and denies adverse effects from any of her medications. She denies chest pains, shortness of breath and has no additional concerns today.  Past Medical History:  Diagnosis Date  . AMA (advanced maternal age) multigravida 35+ 05/09/2013   Offered genetic screening, discussed options, patient desires quad screen [facility]  Dating LMP consistent with 10 week scan  Genetic Screen  Quad:                  NIPS:  Anatomic [place]   GTT Type II DM  TDaP vaccine   Flu vaccine   GBS   Baby Food Breast  Contraception Condoms  Pediatrician [place]  Support Person Mother      . AMA (advanced maternal age) multigravida 35+ 05/09/2013   Offered genetic screening, discussed options, patient desires quad screen [facility]  Dating LMP consistent with 10 week scan  Genetic Screen  Quad normal  Anatomic [place] Limited, will reevaluate on repeat scans  GTT Type II DM  TDaP vaccine   Flu vaccine   GBS   Baby Food Breast  Contraception Condoms  Pediatrician [place]  Support Person Mother      . Diabetes mellitus   . LGA (large for gestational age) fetus affecting mother, antepartum 11/17/2013   11/16/2013 EFW 3747gm >90%tile     Past Surgical History:  Procedure Laterality Date  . CESAREAN SECTION    . CESAREAN SECTION N/A 12/03/2013   Procedure: CESAREAN SECTION;  Surgeon: [name], MD;  Location: [facility];  Service: Obstetrics;  Laterality: N/A;    No Known Allergies   Outpatient  Medications Prior to Visit  Medication Sig Dispense Refill  . Blood Glucose Monitoring Suppl (TRUE METRIX METER) w/Device KIT 1 each by Does not apply route 3 (three) times daily. 1 kit 0  . glucose blood (TRUE METRIX BLOOD GLUCOSE TEST) test strip 1 each by Other route 3 (three) times daily. 100 each 11  . glucose blood test strip Check blood sugars twice a day. 100 each 11  . methimazole (TAPAZOLE) 5 MG tablet Take 1 tablet (5 mg total) by mouth 3 (three) times daily. 90 tablet 5  . Prenatal Vit-Fe Fumarate-FA (PRENATAL MULTIVITAMIN) TABS tablet Take 1 tablet by mouth daily. 30 tablet 2  . TRUEPLUS LANCETS 28G MISC 1 each by Does not apply route 2 (two) times daily. 100 each 12  . atorvastatin (LIPITOR) 10 MG tablet Take 1 tablet (10 mg total) by mouth daily. 30 tablet 5  . insulin NPH-regular Human (NOVOLIN 70/30) (70-30) 100 UNIT/ML injection Inject 17 Units into the skin 2 (two) times daily with a meal. 10 mL 5  . lisinopril (PRINIVIL,ZESTRIL) 2.5 MG tablet Take 1 tablet (2.5 mg total) by mouth daily. 30 tablet 5  . metFORMIN (GLUCOPHAGE-XR) 500 MG 24 hr tablet Take 2 tablets (1,000 mg total) by mouth 2 (two) times daily. 120 tablet 5  . metoprolol succinate (TOPROL-XL) 25 MG 24 hr tablet Take 1 tablet (25  mg total) by mouth daily. 30 tablet 5   No facility-administered medications prior to visit.     ROS Review of Systems  Constitutional: Negative for activity change, appetite change and fatigue.  HENT: Negative for congestion, sinus pressure and sore throat.   Eyes: Negative for visual disturbance.  Respiratory: Negative for cough, chest tightness, shortness of breath and wheezing.   Cardiovascular: Negative for chest pain and palpitations.  Gastrointestinal: Negative for abdominal distention, abdominal pain and constipation.  Endocrine: Negative for polydipsia.  Genitourinary: Negative for dysuria and frequency.  Musculoskeletal: Negative for arthralgias and back pain.  Skin:  Negative for rash.  Neurological: Negative for tremors, light-headedness and numbness.  Hematological: Does not bruise/bleed easily.  Psychiatric/Behavioral: Negative for agitation and behavioral problems.    Objective:  BP 108/72   Pulse 82   Temp 98.2 F (36.8 C) (Oral)   Wt 156 lb 3.2 oz (70.9 kg)   SpO2 100%   BMI 27.67 kg/m   BP/Weight 11/24/2017 08/20/2017 4/32/7614  Systolic BP 709 295 747  Diastolic BP 72 72 68  Wt. (Lbs) 156.2 158 157  BMI 27.67 27.99 27.81      Physical Exam  Constitutional: She is oriented to person, place, and time. She appears well-developed and well-nourished.  Cardiovascular: Normal rate, normal heart sounds and intact distal pulses.  No murmur heard. Pulmonary/Chest: Effort normal and breath sounds normal. She has no wheezes. She has no rales. She exhibits no tenderness.  Abdominal: Soft. Bowel sounds are normal. She exhibits no distension and no mass. There is no tenderness.  Musculoskeletal: Normal range of motion.  Neurological: She is alert and oriented to person, place, and time.  Skin: Skin is warm and dry.  Psychiatric: She has a normal mood and affect.     CMP Latest Ref Rng & Units 05/06/2017 12/29/2016 02/01/2016  Glucose 65 - 99 mg/dL - 198(H) 128(H)  BUN 6 - 24 mg/dL - 9 9  Creatinine 0.57 - 1.00 mg/dL - 0.67 0.75  Sodium 134 - 144 mmol/L - 141 138  Potassium 3.5 - 5.2 mmol/L - 4.5 4.2  Chloride 96 - 106 mmol/L - 104 102  CO2 20 - 29 mmol/L - 23 25  Calcium 8.7 - 10.2 mg/dL - 9.6 10.2  Total Protein 6.0 - 8.5 g/dL 7.3 7.5 -  Total Bilirubin 0.0 - 1.2 mg/dL 0.2 0.3 -  Alkaline Phos 39 - 117 IU/L 98 95 -  AST 0 - 40 IU/L 11 13 -  ALT 0 - 32 IU/L 12 10 -    Lipid Panel     Component Value Date/Time   CHOL 159 05/06/2017 1030   TRIG 52 05/06/2017 1030   HDL 65 05/06/2017 1030   CHOLHDL 2.4 05/06/2017 1030   CHOLHDL 3.7 02/01/2016 1703   VLDL 21 02/01/2016 1703   LDLCALC 84 05/06/2017 1030    Lab Results  Component  Value Date   TSH 2.780 05/06/2017    Lab Results  Component Value Date   HGBA1C 7.1 (A) 11/24/2017    Assessment & Plan:   1. Controlled type 2 diabetes mellitus with hyperglycemia, with long-term current use of insulin (HCC) Controlled with A1c of 7.1 Counseled on Diabetic diet, my plate method, 340 minutes of moderate intensity exercise/week Keep blood sugar logs with fasting goals of 80-120 mg/dl, random of less than 180 and in the event of sugars less than 60 mg/dl or greater than 400 mg/dl please notify the clinic ASAP. It is recommended  that you undergo annual eye exams and annual foot exams. Pneumonia vaccine is recommended. - POCT glucose (manual entry) - POCT glycosylated hemoglobin (Hb A1C) - CMP14+EGFR - Lipid panel - Microalbumin/Creatinine Ratio, Urine - metFORMIN (GLUCOPHAGE-XR) 500 MG 24 hr tablet; Take 2 tablets (1,000 mg total) by mouth 2 (two) times daily.  Dispense: 120 tablet; Refill: 5 - lisinopril (PRINIVIL,ZESTRIL) 2.5 MG tablet; Take 1 tablet (2.5 mg total) by mouth daily.  Dispense: 30 tablet; Refill: 5 - insulin NPH-regular Human (NOVOLIN 70/30) (70-30) 100 UNIT/ML injection; Inject 17 Units into the skin 2 (two) times daily with a meal.  Dispense: 10 mL; Refill: 5  2. Graves disease Controlled - metoprolol succinate (TOPROL-XL) 25 MG 24 hr tablet; Take 1 tablet (25 mg total) by mouth daily.  Dispense: 30 tablet; Refill: 5 - T4, free - TSH  3. Mixed hyperlipidemia   4. Pure hypercholesterolemia Controlled Low-cholesterol diet - atorvastatin (LIPITOR) 10 MG tablet; Take 1 tablet (10 mg total) by mouth daily.  Dispense: 30 tablet; Refill: 5   Meds ordered this encounter  Medications  . metoprolol succinate (TOPROL-XL) 25 MG 24 hr tablet    Sig: Take 1 tablet (25 mg total) by mouth daily.    Dispense:  30 tablet    Refill:  5  . metFORMIN (GLUCOPHAGE-XR) 500 MG 24 hr tablet    Sig: Take 2 tablets (1,000 mg total) by mouth 2 (two) times daily.     Dispense:  120 tablet    Refill:  5    Discontinue previous dose  . lisinopril (PRINIVIL,ZESTRIL) 2.5 MG tablet    Sig: Take 1 tablet (2.5 mg total) by mouth daily.    Dispense:  30 tablet    Refill:  5  . insulin NPH-regular Human (NOVOLIN 70/30) (70-30) 100 UNIT/ML injection    Sig: Inject 17 Units into the skin 2 (two) times daily with a meal.    Dispense:  10 mL    Refill:  5  . atorvastatin (LIPITOR) 10 MG tablet    Sig: Take 1 tablet (10 mg total) by mouth daily.    Dispense:  30 tablet    Refill:  5    Follow-up: Return in about 6 months (around 05/27/2018) for follow up of chronic medical conditions.   Charlott Rakes MD

## 2017-11-24 NOTE — Patient Instructions (Signed)
Diabetes Mellitus and Nutrition When you have diabetes (diabetes mellitus), it is very important to have healthy eating habits because your blood sugar (glucose) levels are greatly affected by what you eat and drink. Eating healthy foods in the appropriate amounts, at about the same times every day, can help you:  Control your blood glucose.  Lower your risk of heart disease.  Improve your blood pressure.  Reach or maintain a healthy weight.  Every person with diabetes is different, and each person has different needs for a meal plan. Your health care provider may recommend that you work with a diet and nutrition specialist (dietitian) to make a meal plan that is best for you. Your meal plan may vary depending on factors such as:  The calories you need.  The medicines you take.  Your weight.  Your blood glucose, blood pressure, and cholesterol levels.  Your activity level.  Other health conditions you have, such as heart or kidney disease.  How do carbohydrates affect me? Carbohydrates affect your blood glucose level more than any other type of food. Eating carbohydrates naturally increases the amount of glucose in your blood. Carbohydrate counting is a method for keeping track of how many carbohydrates you eat. Counting carbohydrates is important to keep your blood glucose at a healthy level, especially if you use insulin or take certain oral diabetes medicines. It is important to know how many carbohydrates you can safely have in each meal. This is different for every person. Your dietitian can help you calculate how many carbohydrates you should have at each meal and for snack. Foods that contain carbohydrates include:  Bread, cereal, rice, pasta, and crackers.  Potatoes and corn.  Peas, beans, and lentils.  Milk and yogurt.  Fruit and juice.  Desserts, such as cakes, cookies, ice cream, and candy.  How does alcohol affect me? Alcohol can cause a sudden decrease in blood  glucose (hypoglycemia), especially if you use insulin or take certain oral diabetes medicines. Hypoglycemia can be a life-threatening condition. Symptoms of hypoglycemia (sleepiness, dizziness, and confusion) are similar to symptoms of having too much alcohol. If your health care provider says that alcohol is safe for you, follow these guidelines:  Limit alcohol intake to no more than 1 drink per day for nonpregnant women and 2 drinks per day for men. One drink equals 12 oz of beer, 5 oz of wine, or 1 oz of hard liquor.  Do not drink on an empty stomach.  Keep yourself hydrated with water, diet soda, or unsweetened iced tea.  Keep in mind that regular soda, juice, and other mixers may contain a lot of sugar and must be counted as carbohydrates.  What are tips for following this plan? Reading food labels  Start by checking the serving size on the label. The amount of calories, carbohydrates, fats, and other nutrients listed on the label are based on one serving of the food. Many foods contain more than one serving per package.  Check the total grams (g) of carbohydrates in one serving. You can calculate the number of servings of carbohydrates in one serving by dividing the total carbohydrates by 15. For example, if a food has 30 g of total carbohydrates, it would be equal to 2 servings of carbohydrates.  Check the number of grams (g) of saturated and trans fats in one serving. Choose foods that have low or no amount of these fats.  Check the number of milligrams (mg) of sodium in one serving. Most people   should limit total sodium intake to less than 2,300 mg per day.  Always check the nutrition information of foods labeled as "low-fat" or "nonfat". These foods may be higher in added sugar or refined carbohydrates and should be avoided.  Talk to your dietitian to identify your daily goals for nutrients listed on the label. Shopping  Avoid buying canned, premade, or processed foods. These  foods tend to be high in fat, sodium, and added sugar.  Shop around the outside edge of the grocery store. This includes fresh fruits and vegetables, bulk grains, fresh meats, and fresh dairy. Cooking  Use low-heat cooking methods, such as baking, instead of high-heat cooking methods like deep frying.  Cook using healthy oils, such as olive, canola, or sunflower oil.  Avoid cooking with butter, cream, or high-fat meats. Meal planning  Eat meals and snacks regularly, preferably at the same times every day. Avoid going long periods of time without eating.  Eat foods high in fiber, such as fresh fruits, vegetables, beans, and whole grains. Talk to your dietitian about how many servings of carbohydrates you can eat at each meal.  Eat 4-6 ounces of lean protein each day, such as lean meat, chicken, fish, eggs, or tofu. 1 ounce is equal to 1 ounce of meat, chicken, or fish, 1 egg, or 1/4 cup of tofu.  Eat some foods each day that contain healthy fats, such as avocado, nuts, seeds, and fish. Lifestyle   Check your blood glucose regularly.  Exercise at least 30 minutes 5 or more days each week, or as told by your health care provider.  Take medicines as told by your health care provider.  Do not use any products that contain nicotine or tobacco, such as cigarettes and e-cigarettes. If you need help quitting, ask your health care provider.  Work with a counselor or diabetes educator to identify strategies to manage stress and any emotional and social challenges. What are some questions to ask my health care provider?  Do I need to meet with a diabetes educator?  Do I need to meet with a dietitian?  What number can I call if I have questions?  When are the best times to check my blood glucose? Where to find more information:  American Diabetes Association: diabetes.org/food-and-fitness/food  Academy of Nutrition and Dietetics:  www.eatright.org/resources/health/diseases-and-conditions/diabetes  National Institute of Diabetes and Digestive and Kidney Diseases (NIH): www.niddk.nih.gov/health-information/diabetes/overview/diet-eating-physical-activity Summary  A healthy meal plan will help you control your blood glucose and maintain a healthy lifestyle.  Working with a diet and nutrition specialist (dietitian) can help you make a meal plan that is best for you.  Keep in mind that carbohydrates and alcohol have immediate effects on your blood glucose levels. It is important to count carbohydrates and to use alcohol carefully. This information is not intended to replace advice given to you by your health care provider. Make sure you discuss any questions you have with your health care provider. Document Released: 12/26/2004 Document Revised: 05/05/2016 Document Reviewed: 05/05/2016 Elsevier Interactive Patient Education  2018 Elsevier Inc.  

## 2017-11-25 LAB — LIPID PANEL
CHOLESTEROL TOTAL: 163 mg/dL (ref 100–199)
Chol/HDL Ratio: 2.8 ratio (ref 0.0–4.4)
HDL: 59 mg/dL (ref >39)
LDL Calculated: 92 mg/dL (ref 0–99)
Triglycerides: 59 mg/dL (ref 0–149)
VLDL CHOLESTEROL CAL: 12 mg/dL (ref 5–40)

## 2017-11-25 LAB — MICROALBUMIN / CREATININE URINE RATIO: Creatinine, Urine: 58.5 mg/dL

## 2017-11-25 LAB — CMP14+EGFR
A/G RATIO: 1.9 (ref 1.2–2.2)
ALBUMIN: 4.7 g/dL (ref 3.5–5.5)
ALT: 13 IU/L (ref 0–32)
AST: 12 IU/L (ref 0–40)
Alkaline Phosphatase: 95 IU/L (ref 39–117)
BILIRUBIN TOTAL: 0.3 mg/dL (ref 0.0–1.2)
BUN / CREAT RATIO: 18 (ref 9–23)
BUN: 12 mg/dL (ref 6–24)
CO2: 22 mmol/L (ref 20–29)
Calcium: 9.9 mg/dL (ref 8.7–10.2)
Chloride: 104 mmol/L (ref 96–106)
Creatinine, Ser: 0.68 mg/dL (ref 0.57–1.00)
GFR calc non Af Amer: 109 mL/min/{1.73_m2} (ref >59)
GFR, EST AFRICAN AMERICAN: 126 mL/min/{1.73_m2} (ref >59)
GLOBULIN, TOTAL: 2.5 g/dL (ref 1.5–4.5)
Glucose: 150 mg/dL — ABNORMAL HIGH (ref 65–99)
POTASSIUM: 4.2 mmol/L (ref 3.5–5.2)
SODIUM: 142 mmol/L (ref 134–144)
TOTAL PROTEIN: 7.2 g/dL (ref 6.0–8.5)

## 2017-11-25 LAB — T4, FREE: FREE T4: 1.01 ng/dL (ref 0.82–1.77)

## 2017-11-25 LAB — TSH: TSH: 5.46 u[IU]/mL — AB (ref 0.450–4.500)

## 2017-11-26 ENCOUNTER — Other Ambulatory Visit: Payer: Self-pay | Admitting: Family Medicine

## 2017-11-26 DIAGNOSIS — E0500 Thyrotoxicosis with diffuse goiter without thyrotoxic crisis or storm: Secondary | ICD-10-CM

## 2017-11-26 MED ORDER — METHIMAZOLE 5 MG PO TABS: 5.0000 mg | ORAL_TABLET | Freq: Two times a day (BID) | ORAL | 5 refills | Status: DC

## 2017-12-01 ENCOUNTER — Ambulatory Visit: Payer: Self-pay | Attending: Family Medicine

## 2017-12-02 ENCOUNTER — Telehealth: Payer: Self-pay

## 2017-12-02 NOTE — Telephone Encounter (Signed)
-----   Message from Hoy RegisterEnobong Newlin, MD sent at 11/26/2017  1:43 PM EDT ----- Cholesterol, kidney and liver function are normal however thyroid is slightly abnormal and I have decreased the dose of her methimazole to 5 mg twice daily.

## 2017-12-02 NOTE — Telephone Encounter (Signed)
Patient was called and informed of lab results via interpreter([id number]) 

## 2018-04-05 ENCOUNTER — Other Ambulatory Visit: Payer: Self-pay

## 2018-04-05 DIAGNOSIS — Z794 Long term (current) use of insulin: Principal | ICD-10-CM

## 2018-04-05 DIAGNOSIS — E1165 Type 2 diabetes mellitus with hyperglycemia: Secondary | ICD-10-CM

## 2018-04-05 MED ORDER — GLUCOSE BLOOD VI STRP: 1.0000 | ORAL_STRIP | Freq: Three times a day (TID) | 2 refills | Status: DC

## 2018-06-01 ENCOUNTER — Other Ambulatory Visit: Payer: Self-pay | Admitting: Pharmacist

## 2018-06-01 DIAGNOSIS — Z794 Long term (current) use of insulin: Principal | ICD-10-CM

## 2018-06-01 DIAGNOSIS — E1165 Type 2 diabetes mellitus with hyperglycemia: Secondary | ICD-10-CM

## 2018-06-01 MED ORDER — INSULIN NPH ISOPHANE & REGULAR (70-30) 100 UNIT/ML ~~LOC~~ SUSP: 17.0000 [IU] | Freq: Two times a day (BID) | SUBCUTANEOUS | 0 refills | Status: DC

## 2018-07-01 ENCOUNTER — Ambulatory Visit: Payer: Self-pay | Admitting: Family Medicine

## 2018-07-21 ENCOUNTER — Encounter: Payer: Self-pay | Admitting: Family Medicine

## 2018-07-21 ENCOUNTER — Other Ambulatory Visit: Payer: Self-pay

## 2018-07-21 ENCOUNTER — Ambulatory Visit: Payer: Self-pay | Attending: Family Medicine | Admitting: Family Medicine

## 2018-07-21 DIAGNOSIS — E0500 Thyrotoxicosis with diffuse goiter without thyrotoxic crisis or storm: Secondary | ICD-10-CM

## 2018-07-21 DIAGNOSIS — E1165 Type 2 diabetes mellitus with hyperglycemia: Secondary | ICD-10-CM

## 2018-07-21 DIAGNOSIS — Z794 Long term (current) use of insulin: Secondary | ICD-10-CM

## 2018-07-21 DIAGNOSIS — E7800 Pure hypercholesterolemia, unspecified: Secondary | ICD-10-CM

## 2018-07-21 MED ORDER — ATORVASTATIN CALCIUM 10 MG PO TABS: 10.0000 mg | ORAL_TABLET | Freq: Every day | ORAL | 1 refills | Status: DC

## 2018-07-21 MED ORDER — METFORMIN HCL ER 500 MG PO TB24: 1000.0000 mg | ORAL_TABLET | Freq: Two times a day (BID) | ORAL | 1 refills | Status: DC

## 2018-07-21 MED ORDER — METOPROLOL SUCCINATE ER 25 MG PO TB24: 25.0000 mg | ORAL_TABLET | Freq: Every day | ORAL | 1 refills | Status: DC

## 2018-07-21 MED ORDER — LISINOPRIL 2.5 MG PO TABS: 2.5000 mg | ORAL_TABLET | Freq: Every day | ORAL | 1 refills | Status: DC

## 2018-07-21 MED ORDER — INSULIN NPH ISOPHANE & REGULAR (70-30) 100 UNIT/ML ~~LOC~~ SUSP: 17.0000 [IU] | Freq: Two times a day (BID) | SUBCUTANEOUS | 3 refills | Status: DC

## 2018-07-21 NOTE — Progress Notes (Signed)
Virtual Visit via Telephone Note  I connected with Rachael Russo on 07/21/18 at  3:50 PM EDT by telephone and verified that I am speaking with the correct person using two identifiers.   I discussed the limitations, risks, security and privacy concerns of performing an evaluation and management service by telephone and the availability of in person appointments. I also discussed with the patient that there may be a patient responsible charge related to this service. The patient expressed understanding and agreed to proceed.   History of Present Illness: Rachael Russo is a 43 year old female with a history of type 2 diabetes mellitus (A1c 7.1), Graves' disease, hyperlipidemia here for follow-up visit seen with the aid of a Spanish telephone interpreter. Her last TSH was elevated at 5.460 and she has been compliant with Tapazole. She is compliant with her diabetic diet and her medications and denies episodes of hypoglycemia's, visual concerns or numbness in extremities.  Also tolerating her statin with no complaints of myalgias. Fasting blood sugars have been in the range of 90-135. She has no additional concerns today.  Observations/Objective: Awake, alert, oriented x3 Not in acute distress  CMP Latest Ref Rng & Units 11/24/2017 05/06/2017 12/29/2016  Glucose 65 - 99 mg/dL 150(H) - 198(H)  BUN 6 - 24 mg/dL 12 - 9  Creatinine 0.57 - 1.00 mg/dL 0.68 - 0.67  Sodium 134 - 144 mmol/L 142 - 141  Potassium 3.5 - 5.2 mmol/L 4.2 - 4.5  Chloride 96 - 106 mmol/L 104 - 104  CO2 20 - 29 mmol/L 22 - 23  Calcium 8.7 - 10.2 mg/dL 9.9 - 9.6  Total Protein 6.0 - 8.5 g/dL 7.2 7.3 7.5  Total Bilirubin 0.0 - 1.2 mg/dL 0.3 0.2 0.3  Alkaline Phos 39 - 117 IU/L 95 98 95  AST 0 - 40 IU/L 12 11 13   ALT 0 - 32 IU/L 13 12 10     Lipid Panel     Component Value Date/Time   CHOL 163 11/24/2017 0914   TRIG 59 11/24/2017 0914   HDL 59 11/24/2017 0914   CHOLHDL 2.8 11/24/2017 0914   CHOLHDL 3.7 02/01/2016 1703   VLDL 21 02/01/2016 1703   LDLCALC 92 11/24/2017 0914    Lab Results  Component Value Date   TSH 5.460 (H) 11/24/2017    Assessment and Plan: 1. Pure hypercholesterolemia Controlled Low-cholesterol diet - atorvastatin (LIPITOR) 10 MG tablet; Take 1 tablet (10 mg total) by mouth daily.  Dispense: 90 tablet; Refill: 1  2. Controlled type 2 diabetes mellitus with hyperglycemia, with long-term current use of insulin (HCC) Controlled with A1c of 7.1 - CMP14+EGFR; Future - lisinopril (PRINIVIL,ZESTRIL) 2.5 MG tablet; Take 1 tablet (2.5 mg total) by mouth daily.  Dispense: 90 tablet; Refill: 1 - metFORMIN (GLUCOPHAGE-XR) 500 MG 24 hr tablet; Take 2 tablets (1,000 mg total) by mouth 2 (two) times daily.  Dispense: 360 tablet; Refill: 1 - Hemoglobin A1c; Future  3. Graves disease Uncontrolled from last TSH We will check thyroid function again and refill Tapazole accordingly - T4, free; Future - TSH; Future - Microalbumin/Creatinine Ratio, Urine; Future - metoprolol succinate (TOPROL-XL) 25 MG 24 hr tablet; Take 1 tablet (25 mg total) by mouth daily.  Dispense: 90 tablet; Refill: 1   Follow Up Instructions:    I discussed the assessment and treatment plan with the patient. The patient was provided an opportunity to ask questions and all were answered. The patient agreed with the plan and demonstrated an understanding of the instructions.  The patient was advised to call back or seek an in-person evaluation if the symptoms worsen or if the condition fails to improve as anticipated.  I provided 12 minutes of non-face-to-face time during this encounter.   Charlott Rakes, MD

## 2018-07-21 NOTE — Progress Notes (Signed)
Patient has been called and DOB has been verified. Patient has been screened and transferred to PCP to start phone visit.  C/C: diabetes   

## 2018-07-22 ENCOUNTER — Ambulatory Visit: Payer: Self-pay | Attending: Family Medicine

## 2018-07-22 ENCOUNTER — Other Ambulatory Visit: Payer: Self-pay

## 2018-07-22 DIAGNOSIS — E1165 Type 2 diabetes mellitus with hyperglycemia: Secondary | ICD-10-CM

## 2018-07-22 DIAGNOSIS — E0500 Thyrotoxicosis with diffuse goiter without thyrotoxic crisis or storm: Secondary | ICD-10-CM

## 2018-07-22 DIAGNOSIS — Z794 Long term (current) use of insulin: Principal | ICD-10-CM

## 2018-07-23 LAB — HEMOGLOBIN A1C
Est. average glucose Bld gHb Est-mCnc: 160 mg/dL
Hgb A1c MFr Bld: 7.2 % — ABNORMAL HIGH (ref 4.8–5.6)

## 2018-07-23 LAB — CMP14+EGFR
ALT: 11 IU/L (ref 0–32)
AST: 13 IU/L (ref 0–40)
Albumin/Globulin Ratio: 2.5 — ABNORMAL HIGH (ref 1.2–2.2)
Albumin: 4.9 g/dL — ABNORMAL HIGH (ref 3.8–4.8)
Alkaline Phosphatase: 86 IU/L (ref 39–117)
BUN/Creatinine Ratio: 14 (ref 9–23)
BUN: 9 mg/dL (ref 6–24)
Bilirubin Total: 0.2 mg/dL (ref 0.0–1.2)
CO2: 22 mmol/L (ref 20–29)
Calcium: 9.5 mg/dL (ref 8.7–10.2)
Chloride: 102 mmol/L (ref 96–106)
Creatinine, Ser: 0.65 mg/dL (ref 0.57–1.00)
GFR calc Af Amer: 127 mL/min/{1.73_m2} (ref >59)
GFR calc non Af Amer: 110 mL/min/{1.73_m2} (ref >59)
Globulin, Total: 2 g/dL (ref 1.5–4.5)
Glucose: 109 mg/dL — ABNORMAL HIGH (ref 65–99)
Potassium: 4.5 mmol/L (ref 3.5–5.2)
Sodium: 142 mmol/L (ref 134–144)
Total Protein: 6.9 g/dL (ref 6.0–8.5)

## 2018-07-23 LAB — T4, FREE: Free T4: 1.27 ng/dL (ref 0.82–1.77)

## 2018-07-23 LAB — MICROALBUMIN / CREATININE URINE RATIO
Creatinine, Urine: 65.8 mg/dL
Microalb/Creat Ratio: 10 mg/g creat (ref 0–29)
Microalbumin, Urine: 6.8 ug/mL

## 2018-07-23 LAB — TSH: TSH: 2.62 u[IU]/mL (ref 0.450–4.500)

## 2018-09-24 ENCOUNTER — Other Ambulatory Visit: Payer: Self-pay | Admitting: Family Medicine

## 2018-09-24 DIAGNOSIS — E0500 Thyrotoxicosis with diffuse goiter without thyrotoxic crisis or storm: Secondary | ICD-10-CM

## 2019-01-05 ENCOUNTER — Other Ambulatory Visit: Payer: Self-pay | Admitting: Family Medicine

## 2019-01-05 DIAGNOSIS — E0500 Thyrotoxicosis with diffuse goiter without thyrotoxic crisis or storm: Secondary | ICD-10-CM

## 2019-01-19 ENCOUNTER — Ambulatory Visit: Payer: Self-pay | Attending: Family Medicine | Admitting: Family Medicine

## 2019-01-28 ENCOUNTER — Other Ambulatory Visit: Payer: Self-pay

## 2019-01-28 ENCOUNTER — Ambulatory Visit: Payer: Self-pay | Attending: Family Medicine

## 2019-02-02 ENCOUNTER — Other Ambulatory Visit: Payer: Self-pay | Admitting: Family Medicine

## 2019-02-02 DIAGNOSIS — E0500 Thyrotoxicosis with diffuse goiter without thyrotoxic crisis or storm: Secondary | ICD-10-CM

## 2019-02-15 ENCOUNTER — Other Ambulatory Visit: Payer: Self-pay

## 2019-02-15 ENCOUNTER — Encounter: Payer: Self-pay | Admitting: Family Medicine

## 2019-02-15 ENCOUNTER — Ambulatory Visit: Payer: Self-pay | Attending: Family Medicine | Admitting: Family Medicine

## 2019-02-15 VITALS — BP 126/69 | HR 77 | Temp 98.0°F | Ht 63.0 in | Wt 154.0 lb

## 2019-02-15 DIAGNOSIS — G5601 Carpal tunnel syndrome, right upper limb: Secondary | ICD-10-CM

## 2019-02-15 DIAGNOSIS — E7800 Pure hypercholesterolemia, unspecified: Secondary | ICD-10-CM

## 2019-02-15 DIAGNOSIS — E0500 Thyrotoxicosis with diffuse goiter without thyrotoxic crisis or storm: Secondary | ICD-10-CM

## 2019-02-15 DIAGNOSIS — E1141 Type 2 diabetes mellitus with diabetic mononeuropathy: Secondary | ICD-10-CM

## 2019-02-15 DIAGNOSIS — Z794 Long term (current) use of insulin: Secondary | ICD-10-CM

## 2019-02-15 DIAGNOSIS — E118 Type 2 diabetes mellitus with unspecified complications: Secondary | ICD-10-CM

## 2019-02-15 LAB — POCT GLYCOSYLATED HEMOGLOBIN (HGB A1C): HbA1c, POC (controlled diabetic range): 6.9 % (ref 0.0–7.0)

## 2019-02-15 LAB — GLUCOSE, POCT (MANUAL RESULT ENTRY): POC Glucose: 121 mg/dl — AB (ref 70–99)

## 2019-02-15 MED ORDER — GABAPENTIN 300 MG PO CAPS: 300.0000 mg | ORAL_CAPSULE | Freq: Every day | ORAL | 3 refills | Status: DC

## 2019-02-15 MED ORDER — METOPROLOL SUCCINATE ER 25 MG PO TB24: 25.0000 mg | ORAL_TABLET | Freq: Every day | ORAL | 1 refills | Status: DC

## 2019-02-15 MED ORDER — METFORMIN HCL ER 500 MG PO TB24: 1000.0000 mg | ORAL_TABLET | Freq: Two times a day (BID) | ORAL | 1 refills | Status: DC

## 2019-02-15 MED ORDER — LISINOPRIL 2.5 MG PO TABS: 2.5000 mg | ORAL_TABLET | Freq: Every day | ORAL | 1 refills | Status: DC

## 2019-02-15 MED ORDER — IBUPROFEN 600 MG PO TABS: 600.0000 mg | ORAL_TABLET | Freq: Three times a day (TID) | ORAL | 1 refills | Status: DC | PRN

## 2019-02-15 MED ORDER — ATORVASTATIN CALCIUM 10 MG PO TABS: 10.0000 mg | ORAL_TABLET | Freq: Every day | ORAL | 1 refills | Status: DC

## 2019-02-15 MED ORDER — NOVOLIN 70/30 (70-30) 100 UNIT/ML ~~LOC~~ SUSP: 17.0000 [IU] | Freq: Two times a day (BID) | SUBCUTANEOUS | 6 refills | Status: DC

## 2019-02-15 MED ORDER — PREDNISONE 20 MG PO TABS: 20.0000 mg | ORAL_TABLET | Freq: Two times a day (BID) | ORAL | 0 refills | Status: DC

## 2019-02-15 NOTE — Progress Notes (Signed)
Subjective:  Patient ID: Rachael Russo, female    DOB: 07/18/1975  Age: 43 y.o. MRN: [id number]  CC: Diabetes   HPI Rachael Russo is a 43 year old female with Graves' disease, type 2 diabetes mellitus, hyperlipidemia seen today for follow-up visit. She complains of numbness in her right hand which has been persistent for over 1 year to the point that she has to shake her hand at night for relief and at other times sleeps with her right arm dangling down the edge of the bed.  She has numbness which is worse in her lateral 4 fingers.  She denies loss of strength or dropping things from that hand.  She is right-handed. Her diabetes has been controlled with no complaints of hypoglycemia or visual concerns.  She is not up-to-date on annual eye exam. For her [name] she has been compliant with Tapazole and for hyperlipidemia she is on Lipitor with no complaints of adverse effects from her medications.  Past Medical History:  Diagnosis Date  . AMA (advanced maternal age) multigravida 35+ 05/09/2013   Offered genetic screening, discussed options, patient desires quad screen [facility]  Dating LMP consistent with 10 week scan  Genetic Screen  Quad:                  NIPS:  Anatomic [place]   GTT Type II DM  TDaP vaccine   Flu vaccine   GBS   Baby Food Breast  Contraception Condoms  Pediatrician [place]  Support Person Mother      . AMA (advanced maternal age) multigravida 35+ 05/09/2013   Offered genetic screening, discussed options, patient desires quad screen Clinic [facility]  Dating LMP consistent with 10 week scan  Genetic Screen  Quad normal  Anatomic [place] Limited, will reevaluate on repeat scans  GTT Type II DM  TDaP vaccine   Flu vaccine   GBS   Baby Food Breast  Contraception Condoms  Pediatrician [place]  Support Person Mother      . Diabetes mellitus   . LGA (large for gestational age) fetus affecting mother, antepartum 11/17/2013   11/16/2013 EFW 3747gm >90%tile      Past Surgical History:  Procedure Laterality Date  . CESAREAN SECTION    . CESAREAN SECTION N/A 12/03/2013   Procedure: CESAREAN SECTION;  Surgeon: Rachael Filbert, MD;  Location: [facility];  Service: Obstetrics;  Laterality: N/A;    Family History  Problem Relation Age of Onset  . Diabetes Mother     No Known Allergies  Outpatient Medications Prior to Visit  Medication Sig Dispense Refill  . Blood Glucose Monitoring Suppl (TRUE METRIX METER) w/Device KIT 1 each by Does not apply route 3 (three) times daily. 1 kit 0  . glucose blood (TRUE METRIX BLOOD GLUCOSE TEST) test strip 1 each by Other route 3 (three) times daily. 100 each 2  . methimazole (TAPAZOLE) 5 MG tablet TAKE 1 TABLET BY MOUTH 3 TIMES DAILY. 90 tablet 0  . TRUEPLUS LANCETS 28G MISC 1 each by Does not apply route 2 (two) times daily. 100 each 12  . atorvastatin (LIPITOR) 10 MG tablet Take 1 tablet (10 mg total) by mouth daily. 90 tablet 1  . insulin NPH-regular Human (NOVOLIN 70/30) (70-30) 100 UNIT/ML injection Inject 17 Units into the skin 2 (two) times daily with a meal. 30 mL 3  . lisinopril (PRINIVIL,ZESTRIL) 2.5 MG tablet Take 1 tablet (2.5 mg total) by mouth daily. 90 tablet  1  . metFORMIN (GLUCOPHAGE-XR) 500 MG 24 hr tablet Take 2 tablets (1,000 mg total) by mouth 2 (two) times daily. 360 tablet 1  . metoprolol succinate (TOPROL-XL) 25 MG 24 hr tablet Take 1 tablet (25 mg total) by mouth daily. 90 tablet 1  . Prenatal Vit-Fe Fumarate-FA (PRENATAL MULTIVITAMIN) TABS tablet Take 1 tablet by mouth daily. (Patient not taking: Reported on 02/15/2019) 30 tablet 2   No facility-administered medications prior to visit.      ROS Review of Systems  Constitutional: Negative for activity change, appetite change and fatigue.  HENT: Negative for congestion, sinus pressure and sore throat.   Eyes: Negative for visual disturbance.  Respiratory: Negative for cough, chest tightness, shortness of breath and wheezing.    Cardiovascular: Negative for chest pain and palpitations.  Gastrointestinal: Negative for abdominal distention, abdominal pain and constipation.  Endocrine: Negative for polydipsia.  Genitourinary: Negative for dysuria and frequency.  Musculoskeletal:       See HPI  Skin: Negative for rash.  Neurological: Positive for numbness. Negative for tremors and light-headedness.  Hematological: Does not bruise/bleed easily.  Psychiatric/Behavioral: Negative for agitation and behavioral problems.    Objective:  BP 126/69   Pulse 77   Temp 98 F (36.7 C) (Oral)   Ht 5' 3"  (1.6 m)   Wt 154 lb (69.9 kg)   SpO2 100%   BMI 27.28 kg/m   BP/Weight 02/15/2019 11/22/313 12/16/5857  Systolic BP 292 446 286  Diastolic BP 69 72 72  Wt. (Lbs) 154 156.2 158  BMI 27.28 27.67 27.99      Physical Exam Constitutional:      Appearance: She is well-developed.  Neck:     Vascular: No JVD.  Cardiovascular:     Rate and Rhythm: Normal rate.     Heart sounds: Normal heart sounds. No murmur.  Pulmonary:     Effort: Pulmonary effort is normal.     Breath sounds: Normal breath sounds. No wheezing or rales.  Chest:     Chest wall: No tenderness.  Abdominal:     General: Bowel sounds are normal. There is no distension.     Palpations: Abdomen is soft. There is no mass.     Tenderness: There is no abdominal tenderness.  Musculoskeletal: Normal range of motion.     Right lower leg: No edema.     Left lower leg: No edema.     Comments: Positive Tinel sign, negative Phalen sign  Neurological:     Mental Status: She is alert and oriented to person, place, and time.  Psychiatric:        Mood and Affect: Mood normal.     CMP Latest Ref Rng & Units 07/22/2018 11/24/2017 05/06/2017  Glucose 65 - 99 mg/dL 109(H) 150(H) -  BUN 6 - 24 mg/dL 9 12 -  Creatinine 0.57 - 1.00 mg/dL 0.65 0.68 -  Sodium 134 - 144 mmol/L 142 142 -  Potassium 3.5 - 5.2 mmol/L 4.5 4.2 -  Chloride 96 - 106 mmol/L 102 104 -  CO2 20 -  29 mmol/L 22 22 -  Calcium 8.7 - 10.2 mg/dL 9.5 9.9 -  Total Protein 6.0 - 8.5 g/dL 6.9 7.2 7.3  Total Bilirubin 0.0 - 1.2 mg/dL 0.2 0.3 0.2  Alkaline Phos 39 - 117 IU/L 86 95 98  AST 0 - 40 IU/L 13 12 11   ALT 0 - 32 IU/L 11 13 12     Lipid Panel     Component Value  Date/Time   CHOL 163 11/24/2017 0914   TRIG 59 11/24/2017 0914   HDL 59 11/24/2017 0914   CHOLHDL 2.8 11/24/2017 0914   CHOLHDL 3.7 02/01/2016 1703   VLDL 21 02/01/2016 1703   LDLCALC 92 11/24/2017 0914    CBC    Component Value Date/Time   WBC 9.2 06/12/2014 1035   RBC 4.53 06/12/2014 1035   HGB 13.4 06/12/2014 1035   HCT 38.2 06/12/2014 1035   PLT 277 06/12/2014 1035   MCV 84.3 06/12/2014 1035   MCH 29.6 06/12/2014 1035   MCHC 35.1 06/12/2014 1035   RDW 12.2 06/12/2014 1035   LYMPHSABS 2.5 04/27/2013 0840   MONOABS 0.6 04/27/2013 0840   EOSABS 0.0 04/27/2013 0840   BASOSABS 0.0 04/27/2013 0840    Lab Results  Component Value Date   HGBA1C 6.9 02/15/2019    Assessment & Plan:   1. 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 Advised on the need for annual eye exams, annual foot exams, Pneumonia vaccine. - POCT glucose (manual entry) - POCT glycosylated hemoglobin (Hb A1C) - CMP14+EGFR - Lipid panel - insulin NPH-regular Human (NOVOLIN 70/30) (70-30) 100 UNIT/ML injection; Inject 17 Units into the skin 2 (two) times daily with a meal.  Dispense: 30 mL; Refill: 6 - lisinopril (ZESTRIL) 2.5 MG tablet; Take 1 tablet (2.5 mg total) by mouth daily.  Dispense: 90 tablet; Refill: 1 - metFORMIN (GLUCOPHAGE-XR) 500 MG  24 hr tablet; Take 2 tablets (1,000 mg total) by mouth 2 (two) times daily.  Dispense: 360 tablet; Refill: 1 - gabapentin (NEURONTIN) 300 MG capsule; Take 1 capsule (300 mg total) by mouth at bedtime.  Dispense: 30 capsule; Refill: 3  2. Pure hypercholesterolemia Controlled Low-cholesterol diet - atorvastatin (LIPITOR) 10 MG tablet; Take 1 tablet (10 mg total) by mouth daily.  Dispense: 90 tablet; Refill: 1  3. Graves disease Controlled We will refill Tapazole after review of thyroid labs - TSH - T4, free - metoprolol succinate (TOPROL-XL) 25 MG 24 hr tablet; Take 1 tablet (25 mg total) by mouth daily.  Dispense: 90 tablet; Refill: 1  4. Carpal tunnel syndrome of right wrist Uncontrolled Short course of prednisone after which she will commence ibuprofen.  Gabapentin added to her regimen and advised to inform the clinic if symptoms do not resolve and she will need referral for cortisone injections. - predniSONE (DELTASONE) 20 MG tablet; Take 1 tablet (20 mg total) by mouth 2 (two) times daily with a meal.  Dispense: 10 tablet; Refill: 0 - ibuprofen (ADVIL) 600 MG tablet; Take 1 tablet (600 mg total) by mouth every 8 (eight) hours as needed.  Dispense: 60 tablet; Refill: 1   Health Care Maintenance: Due for annual eye exam, lack of medical coverage precludes this.  She will need to look into community resources available including [employer]  Meds ordered this encounter  Medications  . atorvastatin (LIPITOR) 10 MG tablet    Sig: Take 1 tablet (10 mg total) by mouth daily.    Dispense:  90 tablet    Refill:  1  . insulin NPH-regular Human (NOVOLIN 70/30) (70-30) 100 UNIT/ML injection  Sig: Inject 17 Units into the skin 2 (two) times daily with a meal.    Dispense:  30 mL    Refill:  6  . lisinopril (ZESTRIL) 2.5 MG tablet    Sig: Take 1 tablet (2.5 mg total) by mouth daily.    Dispense:  90 tablet    Refill:  1  . metFORMIN (GLUCOPHAGE-XR) 500 MG 24 hr tablet    Sig: Take 2 tablets  (1,000 mg total) by mouth 2 (two) times daily.    Dispense:  360 tablet    Refill:  1  . metoprolol succinate (TOPROL-XL) 25 MG 24 hr tablet    Sig: Take 1 tablet (25 mg total) by mouth daily.    Dispense:  90 tablet    Refill:  1  . predniSONE (DELTASONE) 20 MG tablet    Sig: Take 1 tablet (20 mg total) by mouth 2 (two) times daily with a meal.    Dispense:  10 tablet    Refill:  0  . ibuprofen (ADVIL) 600 MG tablet    Sig: Take 1 tablet (600 mg total) by mouth every 8 (eight) hours as needed.    Dispense:  60 tablet    Refill:  1  . gabapentin (NEURONTIN) 300 MG capsule    Sig: Take 1 capsule (300 mg total) by mouth at bedtime.    Dispense:  30 capsule    Refill:  3    Follow-up: Return in about 6 months (around 08/15/2019) for Chronic medical conditions.       Charlott Rakes, MD, FAAFP. [facility] and [facility], [place]   02/15/2019, 10:14 AM

## 2019-02-16 LAB — TSH: TSH: 9.74 u[IU]/mL — ABNORMAL HIGH (ref 0.450–4.500)

## 2019-02-16 LAB — CMP14+EGFR
ALT: 11 IU/L (ref 0–32)
AST: 12 IU/L (ref 0–40)
Albumin/Globulin Ratio: 1.8 (ref 1.2–2.2)
Albumin: 4.6 g/dL (ref 3.8–4.8)
Alkaline Phosphatase: 93 IU/L (ref 39–117)
BUN/Creatinine Ratio: 13 (ref 9–23)
BUN: 9 mg/dL (ref 6–24)
Bilirubin Total: 0.3 mg/dL (ref 0.0–1.2)
CO2: 22 mmol/L (ref 20–29)
Calcium: 9.7 mg/dL (ref 8.7–10.2)
Chloride: 103 mmol/L (ref 96–106)
Creatinine, Ser: 0.67 mg/dL (ref 0.57–1.00)
GFR calc Af Amer: 125 mL/min/{1.73_m2} (ref >59)
GFR calc non Af Amer: 108 mL/min/{1.73_m2} (ref >59)
Globulin, Total: 2.5 g/dL (ref 1.5–4.5)
Glucose: 135 mg/dL — ABNORMAL HIGH (ref 65–99)
Potassium: 4.7 mmol/L (ref 3.5–5.2)
Sodium: 139 mmol/L (ref 134–144)
Total Protein: 7.1 g/dL (ref 6.0–8.5)

## 2019-02-16 LAB — LIPID PANEL
Chol/HDL Ratio: 2.3 ratio (ref 0.0–4.4)
Cholesterol, Total: 144 mg/dL (ref 100–199)
HDL: 62 mg/dL (ref >39)
LDL Chol Calc (NIH): 72 mg/dL (ref 0–99)
Triglycerides: 46 mg/dL (ref 0–149)
VLDL Cholesterol Cal: 10 mg/dL (ref 5–40)

## 2019-02-16 LAB — T4, FREE: Free T4: 0.61 ng/dL — ABNORMAL LOW (ref 0.82–1.77)

## 2019-02-17 ENCOUNTER — Other Ambulatory Visit: Payer: Self-pay | Admitting: Family Medicine

## 2019-02-17 DIAGNOSIS — E0500 Thyrotoxicosis with diffuse goiter without thyrotoxic crisis or storm: Secondary | ICD-10-CM

## 2019-02-17 MED ORDER — METHIMAZOLE 5 MG PO TABS: 5.0000 mg | ORAL_TABLET | Freq: Two times a day (BID) | ORAL | 3 refills | Status: DC

## 2019-02-18 ENCOUNTER — Telehealth: Payer: Self-pay

## 2019-02-18 NOTE — Telephone Encounter (Signed)
-----   Message from Charlott Rakes, MD sent at 02/17/2019  3:18 PM EST ----- Cholesterol, kidney and liver functions are normal.  Thyroid lab is abnormal and I have reduced the dose of your thyroid medication from 3 times daily down to 2 times daily

## 2019-02-18 NOTE — Telephone Encounter (Signed)
Patient name and DOB has been verified Patient was informed of lab results. Patient had no questions.  

## 2019-07-07 ENCOUNTER — Other Ambulatory Visit: Payer: Self-pay | Admitting: Family Medicine

## 2019-07-07 DIAGNOSIS — E0500 Thyrotoxicosis with diffuse goiter without thyrotoxic crisis or storm: Secondary | ICD-10-CM

## 2019-08-29 ENCOUNTER — Other Ambulatory Visit: Payer: Self-pay | Admitting: Family Medicine

## 2019-08-29 DIAGNOSIS — Z794 Long term (current) use of insulin: Secondary | ICD-10-CM

## 2019-08-29 DIAGNOSIS — E0500 Thyrotoxicosis with diffuse goiter without thyrotoxic crisis or storm: Secondary | ICD-10-CM

## 2019-09-20 ENCOUNTER — Ambulatory Visit: Payer: Self-pay | Attending: Family Medicine | Admitting: Family Medicine

## 2019-09-20 ENCOUNTER — Other Ambulatory Visit: Payer: Self-pay | Admitting: Family Medicine

## 2019-09-20 ENCOUNTER — Other Ambulatory Visit: Payer: Self-pay

## 2019-09-20 ENCOUNTER — Encounter: Payer: Self-pay | Admitting: Family Medicine

## 2019-09-20 DIAGNOSIS — G5601 Carpal tunnel syndrome, right upper limb: Secondary | ICD-10-CM

## 2019-09-20 DIAGNOSIS — Z794 Long term (current) use of insulin: Secondary | ICD-10-CM

## 2019-09-20 DIAGNOSIS — E7800 Pure hypercholesterolemia, unspecified: Secondary | ICD-10-CM

## 2019-09-20 DIAGNOSIS — E0500 Thyrotoxicosis with diffuse goiter without thyrotoxic crisis or storm: Secondary | ICD-10-CM

## 2019-09-20 DIAGNOSIS — E1141 Type 2 diabetes mellitus with diabetic mononeuropathy: Secondary | ICD-10-CM

## 2019-09-20 MED ORDER — LISINOPRIL 2.5 MG PO TABS: 2.5000 mg | ORAL_TABLET | Freq: Every day | ORAL | 1 refills | Status: DC

## 2019-09-20 MED ORDER — ATORVASTATIN CALCIUM 10 MG PO TABS: 10.0000 mg | ORAL_TABLET | Freq: Every day | ORAL | 1 refills | Status: DC

## 2019-09-20 MED ORDER — HUMULIN 70/30 (70-30) 100 UNIT/ML ~~LOC~~ SUSP: SUBCUTANEOUS | 6 refills | Status: DC

## 2019-09-20 MED ORDER — METOPROLOL SUCCINATE ER 25 MG PO TB24: 25.0000 mg | ORAL_TABLET | Freq: Every day | ORAL | 1 refills | Status: DC

## 2019-09-20 MED ORDER — IBUPROFEN 600 MG PO TABS
600.0000 mg | ORAL_TABLET | Freq: Three times a day (TID) | ORAL | 3 refills | Status: DC | PRN
  Filled 2020-07-17 (×2): qty 60, 20d supply, fill #0

## 2019-09-20 MED ORDER — GABAPENTIN 300 MG PO CAPS: 300.0000 mg | ORAL_CAPSULE | Freq: Every day | ORAL | 1 refills | Status: DC

## 2019-09-20 MED ORDER — METFORMIN HCL ER 500 MG PO TB24: 1000.0000 mg | ORAL_TABLET | Freq: Two times a day (BID) | ORAL | 1 refills | Status: DC

## 2019-09-21 ENCOUNTER — Other Ambulatory Visit: Payer: Self-pay

## 2019-09-21 ENCOUNTER — Ambulatory Visit: Payer: Self-pay | Attending: Family Medicine

## 2019-09-21 DIAGNOSIS — E0500 Thyrotoxicosis with diffuse goiter without thyrotoxic crisis or storm: Secondary | ICD-10-CM

## 2019-09-21 DIAGNOSIS — E1141 Type 2 diabetes mellitus with diabetic mononeuropathy: Secondary | ICD-10-CM

## 2019-09-21 DIAGNOSIS — E7800 Pure hypercholesterolemia, unspecified: Secondary | ICD-10-CM

## 2019-09-21 NOTE — Progress Notes (Signed)
Virtual Visit via Telephone Note  I connected with [name], on 09/21/2019 at 5:08 AM by telephone due to the COVID-19 pandemic and verified that I am speaking with the correct person using two identifiers.   Consent: I discussed the limitations, risks, security and privacy concerns of performing an evaluation and management service by telephone and the availability of in person appointments. I also discussed with the patient that there may be a patient responsible charge related to this service. The patient expressed understanding and agreed to proceed.   Location of Patient: Home  Location of Provider: Clinic   Persons participating in Telemedicine visit: Amahia Windell Moment [place]-CMA Dr. Georgina Quint ID [phone] - Interpreter   History of Present Illness: Rachael Russo is a 44 year old female with Graves' disease, type 2 diabetes mellitus (A1c 6.9), hyperlipidemia seen today for follow-up visit.   Her R hand has been painful since her last visit in 02/2019 and she has to shake it for relief. At her last visit she was diagnosed with Carpal tunnel syndrome and Ibuprofen did provide some relief however she has run out of it. With regards to her diabetes mellitus her blood sugars have been controlled and she denies presence of hypoglycemia, numbness in extremities. She is compliant with her statin and also doing well on Tapazole for Graves' disease.  Last TSH was elevated at 9.740. She has no additional concerns today.  Past Medical History:  Diagnosis Date  . AMA (advanced maternal age) multigravida 35+ 05/09/2013   Offered genetic screening, discussed options, patient desires quad screen Clinic [facility]  Dating LMP consistent with 10 week scan  Genetic Screen  Quad:                  NIPS:  Anatomic [place]   GTT Type II DM  TDaP vaccine   Flu vaccine   GBS   Baby Food Breast  Contraception Condoms  Pediatrician [place]  Support Person Mother      .  AMA (advanced maternal age) multigravida 35+ 05/09/2013   Offered genetic screening, discussed options, patient desires quad screen Clinic [facility]  Dating LMP consistent with 10 week scan  Genetic Screen  Quad normal  Anatomic [place] Limited, will reevaluate on repeat scans  GTT Type II DM  TDaP vaccine   Flu vaccine   GBS   Baby Food Breast  Contraception Condoms  Pediatrician [place]  Support Person Mother      . Diabetes mellitus   . LGA (large for gestational age) fetus affecting mother, antepartum 11/17/2013   11/16/2013 EFW 3747gm >90%tile    No Known Allergies  Current Outpatient Medications on File Prior to Visit  Medication Sig Dispense Refill  . Blood Glucose Monitoring Suppl (TRUE METRIX METER) w/Device KIT 1 each by Does not apply route 3 (three) times daily. 1 kit 0  . glucose blood (TRUE METRIX BLOOD GLUCOSE TEST) test strip 1 each by Other route 3 (three) times daily. 100 each 2  . methimazole (TAPAZOLE) 5 MG tablet TAKE 1 TABLET (5 MG TOTAL) BY MOUTH 2 (TWO) TIMES DAILY. MUST HAVE OFFICE VISIT FOR REFILLS 60 tablet 0  . TRUEPLUS LANCETS 28G MISC 1 each by Does not apply route 2 (two) times daily. 100 each 12  . Prenatal Vit-Fe Fumarate-FA (PRENATAL MULTIVITAMIN) TABS tablet Take 1 tablet by mouth daily. (Patient not taking: Reported on 02/15/2019) 30 tablet 2   No current facility-administered medications on file prior to visit.  Observations/Objective: Awake, alert, oriented x3 Not in acute distress  Lab Results  Component Value Date   HGBA1C 6.9 02/15/2019   Lab Results  Component Value Date   TSH 9.740 (H) 02/15/2019    Lipid Panel     Component Value Date/Time   CHOL 144 02/15/2019 1030   TRIG 46 02/15/2019 1030   HDL 62 02/15/2019 1030   CHOLHDL 2.3 02/15/2019 1030   CHOLHDL 3.7 02/01/2016 1703   VLDL 21 02/01/2016 1703   LDLCALC 72 02/15/2019 1030   LABVLDL 10 02/15/2019 1030    Assessment and Plan: 1. Controlled type 2 diabetes mellitus with  diabetic mononeuropathy, with long-term current use of insulin (HCC) Controlled with A1c of 6.9; goal is less than 7.0 Continue current regimen Counseled on Diabetic diet, my plate method, 101 minutes of moderate intensity exercise/week Blood sugar logs with fasting goals of 80-120 mg/dl, random of less than 180 and in the event of sugars less than 60 mg/dl or greater than 400 mg/dl encouraged to notify the clinic. Advised on the need for annual eye exams, annual foot exams, Pneumonia vaccine. - CMP14+EGFR; Future - Microalbumin / creatinine urine ratio; Future - gabapentin (NEURONTIN) 300 MG capsule; Take 1 capsule (300 mg total) by mouth at bedtime.  Dispense: 90 capsule; Refill: 1 - insulin NPH-regular Human (HUMULIN 70/30) (70-30) 100 UNIT/ML injection; INJECT 17 UNITS INTO THE SKIN 2 (TWO) TIMES DAILY WITH A MEAL.  Dispense: 30 mL; Refill: 6 - lisinopril (ZESTRIL) 2.5 MG tablet; Take 1 tablet (2.5 mg total) by mouth daily.  Dispense: 90 tablet; Refill: 1 - metFORMIN (GLUCOPHAGE-XR) 500 MG 24 hr tablet; Take 2 tablets (1,000 mg total) by mouth 2 (two) times daily.  Dispense: 360 tablet; Refill: 1  2. Pure hypercholesterolemia Controlled Low-cholesterol diet - Lipid panel; Future - atorvastatin (LIPITOR) 10 MG tablet; Take 1 tablet (10 mg total) by mouth daily.  Dispense: 90 tablet; Refill: 1  3. Carpal tunnel syndrome of right wrist Uncontrolled due to running out of ibuprofen which I have refilled Advised that if symptoms persist she will need a referral to an orthopedic for cortisone injections - ibuprofen (ADVIL) 600 MG tablet; Take 1 tablet (600 mg total) by mouth every 8 (eight) hours as needed.  Dispense: 60 tablet; Refill: 3  4. Graves disease Last TSH was elevated We will send of thyroid panel and adjust methimazole dose accordingly - T4, free; Future - TSH; Future - metoprolol succinate (TOPROL-XL) 25 MG 24 hr tablet; Take 1 tablet (25 mg total) by mouth daily.  Dispense: 90  tablet; Refill: 1   Follow Up Instructions: 3 months for chronic disease management; fasting labs tomorrow   I discussed the assessment and treatment plan with the patient. The patient was provided an opportunity to ask questions and all were answered. The patient agreed with the plan and demonstrated an understanding of the instructions.   The patient was advised to call back or seek an in-person evaluation if the symptoms worsen or if the condition fails to improve as anticipated.     I provided 16 minutes total of non-face-to-face time during this encounter including median intraservice time, reviewing previous notes, investigations, ordering medications, medical decision making, coordinating care and patient verbalized understanding at the end of the visit.     Charlott Rakes, MD, FAAFP. [facility] and [place], [place]   09/21/2019, 5:08 AM

## 2019-09-22 LAB — LIPID PANEL
Chol/HDL Ratio: 2.5 ratio (ref 0.0–4.4)
Cholesterol, Total: 157 mg/dL (ref 100–199)
HDL: 64 mg/dL (ref >39)
LDL Chol Calc (NIH): 83 mg/dL (ref 0–99)
Triglycerides: 49 mg/dL (ref 0–149)
VLDL Cholesterol Cal: 10 mg/dL (ref 5–40)

## 2019-09-22 LAB — CMP14+EGFR
ALT: 17 IU/L (ref 0–32)
AST: 14 IU/L (ref 0–40)
Albumin/Globulin Ratio: 2.1 (ref 1.2–2.2)
Albumin: 5.2 g/dL — ABNORMAL HIGH (ref 3.8–4.8)
Alkaline Phosphatase: 120 IU/L (ref 48–121)
BUN/Creatinine Ratio: 13 (ref 9–23)
BUN: 9 mg/dL (ref 6–24)
Bilirubin Total: 0.3 mg/dL (ref 0.0–1.2)
CO2: 21 mmol/L (ref 20–29)
Calcium: 9.6 mg/dL (ref 8.7–10.2)
Chloride: 100 mmol/L (ref 96–106)
Creatinine, Ser: 0.7 mg/dL (ref 0.57–1.00)
GFR calc Af Amer: 123 mL/min/{1.73_m2} (ref >59)
GFR calc non Af Amer: 106 mL/min/{1.73_m2} (ref >59)
Globulin, Total: 2.5 g/dL (ref 1.5–4.5)
Glucose: 195 mg/dL — ABNORMAL HIGH (ref 65–99)
Potassium: 4.1 mmol/L (ref 3.5–5.2)
Sodium: 143 mmol/L (ref 134–144)
Total Protein: 7.7 g/dL (ref 6.0–8.5)

## 2019-09-22 LAB — TSH: TSH: 1.91 u[IU]/mL (ref 0.450–4.500)

## 2019-09-22 LAB — T4, FREE: Free T4: 1.12 ng/dL (ref 0.82–1.77)

## 2019-09-22 LAB — MICROALBUMIN / CREATININE URINE RATIO
Creatinine, Urine: 183.4 mg/dL
Microalb/Creat Ratio: 22 mg/g creat (ref 0–29)
Microalbumin, Urine: 40.5 ug/mL

## 2019-09-23 ENCOUNTER — Telehealth: Payer: Self-pay

## 2019-09-23 NOTE — Telephone Encounter (Signed)
Patient name and DOB has been verified Patient was informed of lab results. Patient had no questions.  

## 2019-09-23 NOTE — Telephone Encounter (Signed)
-----   Message from Hoy Register, MD sent at 09/23/2019 11:50 AM EDT ----- Please inform the patient that labs are normal. Thank you.

## 2019-10-03 ENCOUNTER — Other Ambulatory Visit: Payer: Self-pay | Admitting: Family Medicine

## 2019-10-03 DIAGNOSIS — E0500 Thyrotoxicosis with diffuse goiter without thyrotoxic crisis or storm: Secondary | ICD-10-CM

## 2019-12-22 ENCOUNTER — Ambulatory Visit: Payer: Self-pay | Attending: Family Medicine | Admitting: Family Medicine

## 2019-12-22 ENCOUNTER — Other Ambulatory Visit: Payer: Self-pay | Admitting: Pharmacist

## 2019-12-22 ENCOUNTER — Other Ambulatory Visit: Payer: Self-pay | Admitting: Family Medicine

## 2019-12-22 ENCOUNTER — Other Ambulatory Visit: Payer: Self-pay

## 2019-12-22 ENCOUNTER — Encounter: Payer: Self-pay | Admitting: Family Medicine

## 2019-12-22 VITALS — BP 109/68 | HR 81 | Ht 63.0 in | Wt 150.6 lb

## 2019-12-22 DIAGNOSIS — Z23 Encounter for immunization: Secondary | ICD-10-CM

## 2019-12-22 DIAGNOSIS — Z1159 Encounter for screening for other viral diseases: Secondary | ICD-10-CM

## 2019-12-22 DIAGNOSIS — E1165 Type 2 diabetes mellitus with hyperglycemia: Secondary | ICD-10-CM

## 2019-12-22 DIAGNOSIS — E7800 Pure hypercholesterolemia, unspecified: Secondary | ICD-10-CM

## 2019-12-22 DIAGNOSIS — E1141 Type 2 diabetes mellitus with diabetic mononeuropathy: Secondary | ICD-10-CM

## 2019-12-22 DIAGNOSIS — Z794 Long term (current) use of insulin: Secondary | ICD-10-CM

## 2019-12-22 DIAGNOSIS — E0500 Thyrotoxicosis with diffuse goiter without thyrotoxic crisis or storm: Secondary | ICD-10-CM

## 2019-12-22 LAB — GLUCOSE, POCT (MANUAL RESULT ENTRY): POC Glucose: 158 mg/dl — AB (ref 70–99)

## 2019-12-22 LAB — POCT GLYCOSYLATED HEMOGLOBIN (HGB A1C): HbA1c, POC (controlled diabetic range): 7.8 % — AB (ref 0.0–7.0)

## 2019-12-22 MED ORDER — TRUE METRIX BLOOD GLUCOSE TEST VI STRP: 1.0000 | ORAL_STRIP | Freq: Three times a day (TID) | 3 refills | Status: DC

## 2019-12-22 MED ORDER — TRUEPLUS LANCETS 28G MISC: 1.0000 | Freq: Two times a day (BID) | 6 refills | Status: DC

## 2019-12-22 MED ORDER — METFORMIN HCL ER 500 MG PO TB24: 1000.0000 mg | ORAL_TABLET | Freq: Two times a day (BID) | ORAL | 6 refills | Status: DC

## 2019-12-22 MED ORDER — METOPROLOL SUCCINATE ER 25 MG PO TB24: 25.0000 mg | ORAL_TABLET | Freq: Every day | ORAL | 6 refills | Status: DC

## 2019-12-22 MED ORDER — TRUE METRIX METER W/DEVICE KIT: 1.0000 | PACK | Freq: Three times a day (TID) | 0 refills | Status: AC

## 2019-12-22 MED ORDER — HUMULIN 70/30 (70-30) 100 UNIT/ML ~~LOC~~ SUSP: SUBCUTANEOUS | 6 refills | Status: DC

## 2019-12-22 MED ORDER — ATORVASTATIN CALCIUM 10 MG PO TABS: 10.0000 mg | ORAL_TABLET | Freq: Every day | ORAL | 6 refills | Status: DC

## 2019-12-22 MED ORDER — LISINOPRIL 2.5 MG PO TABS: 2.5000 mg | ORAL_TABLET | Freq: Every day | ORAL | 1 refills | Status: DC

## 2019-12-22 MED ORDER — METHIMAZOLE 5 MG PO TABS: 5.0000 mg | ORAL_TABLET | Freq: Two times a day (BID) | ORAL | 6 refills | Status: DC

## 2019-12-22 NOTE — Progress Notes (Signed)
Subjective:  Patient ID: Rachael Russo, female    DOB: Apr 02, 1976  Age: 44 y.o. MRN: [id number]  CC: Diabetes   HPI Rachael Russo is a 44 year old female with Graves' disease, type 2 diabetes mellitus (A1c 7.8), hyperlipidemia seen today for a follow-up visit.  Her A1c is 7.8 which has trended up from 6.9 previously.  She endorses compliance with insulin. Not checking her sugars due to a faulty Glucometer Denies presence of hypoglycemic symptoms, numbness in extremities, visual concerns and has not had a recent eye exam due to lack of medical coverage.  For her [name] she is currently on Tapazole and her last thyroid panel was normal in 09/2019. She denies acute concerns today. She is due for complete physical exam.  Past Medical History:  Diagnosis Date  . AMA (advanced maternal age) multigravida 35+ 05/09/2013   Offered genetic screening, discussed options, patient desires quad screen [facility] [place]  Dating LMP consistent with 10 week scan  Genetic Screen  Quad:                  NIPS:  Anatomic [place]   GTT Type II DM  TDaP vaccine   Flu vaccine   GBS   Baby Food Breast  Contraception Condoms  Pediatrician [place]  Support Person Mother      . AMA (advanced maternal age) multigravida 35+ 05/09/2013   Offered genetic screening, discussed options, patient desires quad screen Clinic [place]  Dating LMP consistent with 10 week scan  Genetic Screen  Quad normal  Anatomic [place], will reevaluate on repeat scans  GTT Type II DM  TDaP vaccine   Flu vaccine   GBS   Baby Food Breast  Contraception Condoms  Pediatrician [place]  Support Person Mother      . Diabetes mellitus   . LGA (large for gestational age) fetus affecting mother, antepartum 11/17/2013   11/16/2013 EFW 3747gm >90%tile     Past Surgical History:  Procedure Laterality Date  . CESAREAN SECTION    . CESAREAN SECTION N/A 12/03/2013   Procedure: CESAREAN SECTION;  Surgeon: [name], MD;   Location: [facility];  Service: Obstetrics;  Laterality: N/A;    Family History  Problem Relation Age of Onset  . Diabetes Mother     No Known Allergies  Outpatient Medications Prior to Visit  Medication Sig Dispense Refill  . ibuprofen (ADVIL) 600 MG tablet Take 1 tablet (600 mg total) by mouth every 8 (eight) hours as needed. 60 tablet 3  . TRUEPLUS LANCETS 28G MISC 1 each by Does not apply route 2 (two) times daily. 100 each 12  . Blood Glucose Monitoring Suppl (TRUE METRIX METER) w/Device KIT 1 each by Does not apply route 3 (three) times daily. 1 kit 0  . glucose blood (TRUE METRIX BLOOD GLUCOSE TEST) test strip 1 each by Other route 3 (three) times daily. 100 each 2  . insulin NPH-regular Human (HUMULIN 70/30) (70-30) 100 UNIT/ML injection INJECT 17 UNITS INTO THE SKIN 2 (TWO) TIMES DAILY WITH A MEAL. 30 mL 6  . lisinopril (ZESTRIL) 2.5 MG tablet Take 1 tablet (2.5 mg total) by mouth daily. 90 tablet 1  . metFORMIN (GLUCOPHAGE-XR) 500 MG 24 hr tablet Take 2 tablets (1,000 mg total) by mouth 2 (two) times daily. 360 tablet 1  . methimazole (TAPAZOLE) 5 MG tablet Take 1 tablet (5 mg total) by mouth 2 (two) times daily. 60 tablet 2  . metoprolol  succinate (TOPROL-XL) 25 MG 24 hr tablet Take 1 tablet (25 mg total) by mouth daily. 90 tablet 1  . gabapentin (NEURONTIN) 300 MG capsule Take 1 capsule (300 mg total) by mouth at bedtime. (Patient not taking: Reported on 12/22/2019) 90 capsule 1  . Prenatal Vit-Fe Fumarate-FA (PRENATAL MULTIVITAMIN) TABS tablet Take 1 tablet by mouth daily. (Patient not taking: Reported on 02/15/2019) 30 tablet 2  . atorvastatin (LIPITOR) 10 MG tablet Take 1 tablet (10 mg total) by mouth daily. (Patient not taking: Reported on 12/22/2019) 90 tablet 1   No facility-administered medications prior to visit.     ROS Review of Systems  Constitutional: Negative for activity change, appetite change and fatigue.  HENT: Negative for congestion, sinus pressure and sore  throat.   Eyes: Negative for visual disturbance.  Respiratory: Negative for cough, chest tightness, shortness of breath and wheezing.   Cardiovascular: Negative for chest pain and palpitations.  Gastrointestinal: Negative for abdominal distention, abdominal pain and constipation.  Endocrine: Negative for polydipsia.  Genitourinary: Negative for dysuria and frequency.  Musculoskeletal: Negative for arthralgias and back pain.  Skin: Negative for rash.  Neurological: Negative for tremors, light-headedness and numbness.  Hematological: Does not bruise/bleed easily.  Psychiatric/Behavioral: Negative for agitation and behavioral problems.    Objective:  BP 109/68   Pulse 81   Ht 5' 3"  (1.6 m)   Wt 150 lb 9.6 oz (68.3 kg)   SpO2 98%   BMI 26.68 kg/m   BP/Weight 12/22/2019 02/15/2019 01/10/2445  Systolic BP 286 381 771  Diastolic BP 68 69 72  Wt. (Lbs) 150.6 154 156.2  BMI 26.68 27.28 27.67      Physical Exam Constitutional:      Appearance: She is well-developed.  Neck:     Vascular: No JVD.  Cardiovascular:     Rate and Rhythm: Normal rate.     Heart sounds: Normal heart sounds. No murmur heard.   Pulmonary:     Effort: Pulmonary effort is normal.     Breath sounds: Normal breath sounds. No wheezing or rales.  Chest:     Chest wall: No tenderness.  Abdominal:     General: Bowel sounds are normal. There is no distension.     Palpations: Abdomen is soft. There is no mass.     Tenderness: There is no abdominal tenderness.  Musculoskeletal:        General: Normal range of motion.     Right lower leg: No edema.     Left lower leg: No edema.  Neurological:     Mental Status: She is alert and oriented to person, place, and time.  Psychiatric:        Mood and Affect: Mood normal.     CMP Latest Ref Rng & Units 09/21/2019 02/15/2019 07/22/2018  Glucose 65 - 99 mg/dL 195(H) 135(H) 109(H)  BUN 6 - 24 mg/dL 9 9 9   Creatinine 0.57 - 1.00 mg/dL 0.70 0.67 0.65  Sodium 134 - 144  mmol/L 143 139 142  Potassium 3.5 - 5.2 mmol/L 4.1 4.7 4.5  Chloride 96 - 106 mmol/L 100 103 102  CO2 20 - 29 mmol/L 21 22 22   Calcium 8.7 - 10.2 mg/dL 9.6 9.7 9.5  Total Protein 6.0 - 8.5 g/dL 7.7 7.1 6.9  Total Bilirubin 0.0 - 1.2 mg/dL 0.3 0.3 0.2  Alkaline Phos 48 - 121 IU/L 120 93 86  AST 0 - 40 IU/L 14 12 13   ALT 0 - 32 IU/L 17 11 11  Lipid Panel     Component Value Date/Time   CHOL 157 09/21/2019 0851   TRIG 49 09/21/2019 0851   HDL 64 09/21/2019 0851   CHOLHDL 2.5 09/21/2019 0851   CHOLHDL 3.7 02/01/2016 1703   VLDL 21 02/01/2016 1703   LDLCALC 83 09/21/2019 0851    CBC    Component Value Date/Time   WBC 9.2 06/12/2014 1035   RBC 4.53 06/12/2014 1035   HGB 13.4 06/12/2014 1035   HCT 38.2 06/12/2014 1035   PLT 277 06/12/2014 1035   MCV 84.3 06/12/2014 1035   MCH 29.6 06/12/2014 1035   MCHC 35.1 06/12/2014 1035   RDW 12.2 06/12/2014 1035   LYMPHSABS 2.5 04/27/2013 0840   MONOABS 0.6 04/27/2013 0840   EOSABS 0.0 04/27/2013 0840   BASOSABS 0.0 04/27/2013 0840    Lab Results  Component Value Date   HGBA1C 7.8 (A) 12/22/2019    Assessment & Plan:  1. 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 Discussed community resources for eye exams including the [facility]. - POCT glucose (manual entry) - POCT glycosylated hemoglobin (Hb A1C) - insulin NPH-regular Human (HUMULIN 70/30) (70-30) 100 UNIT/ML injection; INJECT 17 UNITS INTO THE SKIN 2 (TWO) TIMES DAILY WITH A MEAL.  Dispense: 30 mL; Refill: 6 - lisinopril (ZESTRIL) 2.5 MG tablet; Take 1 tablet (2.5 mg total) by mouth daily.  Dispense: 90 tablet; Refill: 1 - metFORMIN (GLUCOPHAGE-XR) 500 MG 24 hr  tablet; Take 2 tablets (1,000 mg total) by mouth 2 (two) times daily.  Dispense: 120 tablet; Refill: 6 - Blood Glucose Monitoring Suppl (TRUE METRIX METER) w/Device KIT; 1 each by Does not apply route 3 (three) times daily.  Dispense: 1 kit; Refill: 0  2. Pure hypercholesterolemia Controlled Low-cholesterol diet - atorvastatin (LIPITOR) 10 MG tablet; Take 1 tablet (10 mg total) by mouth daily.  Dispense: 30 tablet; Refill: 6  3. Graves disease Controlled - methimazole (TAPAZOLE) 5 MG tablet; Take 1 tablet (5 mg total) by mouth 2 (two) times daily.  Dispense: 60 tablet; Refill: 6 - metoprolol succinate (TOPROL-XL) 25 MG 24 hr tablet; Take 1 tablet (25 mg total) by mouth daily.  Dispense: 30 tablet; Refill: 6  4. Need for hepatitis C screening test - HCV RNA quant rflx ultra or genotyp([facility])  5. Controlled type 2 diabetes mellitus with hyperglycemia, with long-term current use of insulin ([place]) See #1 above - glucose blood (TRUE METRIX BLOOD GLUCOSE TEST) test strip; 1 each by Other route 3 (three) times daily.  Dispense: 100 each; Refill: 3  6. Need for immunization against influenza - Flu Vaccine QUAD 36+ mos IM   Health Care Maintenance: At next visit Meds ordered this encounter  Medications  . atorvastatin (LIPITOR) 10 MG tablet    Sig: Take 1 tablet (10 mg total) by mouth daily.    Dispense:  30 tablet    Refill:  6  . insulin NPH-regular Human (HUMULIN 70/30) (70-30) 100 UNIT/ML injection    Sig: INJECT 17 UNITS INTO THE SKIN 2 (TWO) TIMES DAILY WITH A MEAL.    Dispense:  30 mL    Refill:  6  . lisinopril (ZESTRIL) 2.5 MG tablet    Sig:  Take 1 tablet (2.5 mg total) by mouth daily.    Dispense:  90 tablet    Refill:  1  . metFORMIN (GLUCOPHAGE-XR) 500 MG 24 hr tablet    Sig: Take 2 tablets (1,000 mg total) by mouth 2 (two) times daily.    Dispense:  120 tablet    Refill:  6  . methimazole (TAPAZOLE) 5 MG tablet    Sig: Take 1 tablet (5 mg total) by mouth 2  (two) times daily.    Dispense:  60 tablet    Refill:  6  . metoprolol succinate (TOPROL-XL) 25 MG 24 hr tablet    Sig: Take 1 tablet (25 mg total) by mouth daily.    Dispense:  30 tablet    Refill:  6  . glucose blood (TRUE METRIX BLOOD GLUCOSE TEST) test strip    Sig: 1 each by Other route 3 (three) times daily.    Dispense:  100 each    Refill:  3  . Blood Glucose Monitoring Suppl (TRUE METRIX METER) w/Device KIT    Sig: 1 each by Does not apply route 3 (three) times daily.    Dispense:  1 kit    Refill:  0    Follow-up: Return in about 1 month (around 01/21/2020) for Complete physical exam.       Charlott Rakes, MD, FAAFP. [facility] and [facility], [place]   12/22/2019, 11:41 AM

## 2019-12-24 LAB — HCV RNA QUANT RFLX ULTRA OR GENOTYP: HCV Quant Baseline: NOT DETECTED IU/mL

## 2019-12-29 ENCOUNTER — Telehealth: Payer: Self-pay

## 2019-12-29 NOTE — Telephone Encounter (Signed)
Patient name and DOB has been verified Patient was informed of lab results. Patient had no questions.  

## 2019-12-29 NOTE — Telephone Encounter (Signed)
-----   Message from Hoy Register, MD sent at 12/26/2019  1:42 PM EDT ----- Please inform the patient that labs are normal. Thank you.

## 2020-01-25 ENCOUNTER — Other Ambulatory Visit: Payer: Self-pay

## 2020-01-25 ENCOUNTER — Ambulatory Visit: Payer: Self-pay | Attending: Family Medicine | Admitting: Family Medicine

## 2020-01-25 ENCOUNTER — Encounter: Payer: Self-pay | Admitting: Family Medicine

## 2020-01-25 VITALS — BP 121/76 | HR 84 | Ht 63.0 in | Wt 150.8 lb

## 2020-01-25 DIAGNOSIS — Z0000 Encounter for general adult medical examination without abnormal findings: Secondary | ICD-10-CM

## 2020-01-25 DIAGNOSIS — Z124 Encounter for screening for malignant neoplasm of cervix: Secondary | ICD-10-CM

## 2020-01-25 NOTE — Progress Notes (Signed)
Name: Rachael Russo   MRN: [id number]    DOB: Sep 04, 1975   Date:01/25/2020      Progress Note  Subjective  Chief Complaint  Chief Complaint  Patient presents with  . Annual Exam  . Gynecologic Exam    HPI  Rachael Russo is a 44 year old female who presents for annual physical exam and gynecologic exam. She has a history of Graves' disease, type 2 diabetes mellitus (A1c 7.8), hyperlipidemia. She has no concerns today.   She does not have regular eye or dental exams. She will try to make an appointment with [facility] for an [facility] exam. She has received both doses of covid vaccine. She has received her influenza vaccine. She hardly exercises and tries to eat healthy. She does not drink alcohol or use tobacco products. She is married.   Depression:  Depression screen [facility] 2/9 01/25/2020 09/20/2019 02/15/2019 11/24/2017 08/20/2017  Decreased Interest 0 0 0 0 0  Down, Depressed, Hopeless 0 0 0 0 0  PHQ - 2 Score 0 0 0 0 0  Altered sleeping 0 0 0 0 0  Tired, decreased energy 0 0 0 0 0  Change in appetite 0 0 0 0 0  Feeling bad or failure about yourself  0 0 0 0 0  Trouble concentrating 0 0 0 0 0  Moving slowly or fidgety/restless 0 0 0 0 0  Suicidal thoughts 0 0 0 0 0  PHQ-9 Score 0 0 0 0 0  Some recent data might be hidden   GAD 7 : Generalized Anxiety Score 01/25/2020 09/20/2019 02/15/2019 11/24/2017  Nervous, Anxious, on Edge 0 0 0 0  Control/stop worrying 0 0 0 0  Worry too much - different things 0 0 0 0  Trouble relaxing 0 0 0 0  Restless 0 0 0 0  Easily annoyed or irritable 0 0 0 0  Afraid - awful might happen 0 0 0 0  Total GAD 7 Score 0 0 0 0     Patient Active Problem List   Diagnosis Date Noted  . Hyperlipidemia 11/24/2017  . Vaginal discharge 09/26/2016  . Uncontrolled type 2 diabetes mellitus without complication, with long-term current use of insulin 07/26/2015  . Finger numbness 12/07/2014  . Poor dentition 10/27/2014  . Graves disease 06/12/2014  .  S/P tubal ligation 06/12/2014  . Language barrier, speaks Spanish only and requires interpreter 05/09/2013    Past Surgical History:  Procedure Laterality Date  . CESAREAN SECTION    . CESAREAN SECTION N/A 12/03/2013   Procedure: CESAREAN SECTION;  Surgeon: [name], MD;  Location: [facility];  Service: Obstetrics;  Laterality: N/A;    Family History  Problem Relation Age of Onset  . Diabetes Mother      Current Outpatient Medications:  .  atorvastatin (LIPITOR) 10 MG tablet, Take 1 tablet (10 mg total) by mouth daily., Disp: 30 tablet, Rfl: 6 .  Blood Glucose Monitoring Suppl (TRUE METRIX METER) w/Device KIT, 1 each by Does not apply route 3 (three) times daily., Disp: 1 kit, Rfl: 0 .  glucose blood (TRUE METRIX BLOOD GLUCOSE TEST) test strip, 1 each by Other route 3 (three) times daily., Disp: 100 each, Rfl: 3 .  ibuprofen (ADVIL) 600 MG tablet, Take 1 tablet (600 mg total) by mouth every 8 (eight) hours as needed., Disp: 60 tablet, Rfl: 3 .  insulin NPH-regular Human (HUMULIN 70/30) (70-30) 100 UNIT/ML injection, INJECT 17 UNITS INTO THE SKIN 2 (TWO) TIMES DAILY  WITH A MEAL., Disp: 30 mL, Rfl: 6 .  lisinopril (ZESTRIL) 2.5 MG tablet, Take 1 tablet (2.5 mg total) by mouth daily., Disp: 90 tablet, Rfl: 1 .  metFORMIN (GLUCOPHAGE-XR) 500 MG 24 hr tablet, Take 2 tablets (1,000 mg total) by mouth 2 (two) times daily., Disp: 120 tablet, Rfl: 6 .  methimazole (TAPAZOLE) 5 MG tablet, Take 1 tablet (5 mg total) by mouth 2 (two) times daily., Disp: 60 tablet, Rfl: 6 .  metoprolol succinate (TOPROL-XL) 25 MG 24 hr tablet, Take 1 tablet (25 mg total) by mouth daily., Disp: 30 tablet, Rfl: 6 .  TRUEplus Lancets 28G MISC, 1 each by Does not apply route 2 (two) times daily., Disp: 100 each, Rfl: 6 .  gabapentin (NEURONTIN) 300 MG capsule, Take 1 capsule (300 mg total) by mouth at bedtime. (Patient not taking: Reported on 12/22/2019), Disp: 90 capsule, Rfl: 1 .  Prenatal Vit-Fe Fumarate-FA (PRENATAL  MULTIVITAMIN) TABS tablet, Take 1 tablet by mouth daily. (Patient not taking: Reported on 02/15/2019), Disp: 30 tablet, Rfl: 2  No Known Allergies   Review of Systems  Constitutional: Negative for fever, malaise/fatigue and weight loss.  HENT: Negative for ear pain, hearing loss and tinnitus.   Eyes: Negative for blurred vision, double vision and pain.  Respiratory: Negative for cough, shortness of breath and wheezing.   Cardiovascular: Negative for chest pain, palpitations and leg swelling.  Gastrointestinal: Negative for abdominal pain, heartburn, nausea and vomiting.  Genitourinary: Negative for dysuria, frequency and urgency.  Musculoskeletal: Negative for falls, joint pain and myalgias.  Skin: Negative for rash.  Neurological: Negative for dizziness, tingling and headaches.  Endo/Heme/Allergies: Does not bruise/bleed easily.  Psychiatric/Behavioral: Negative for depression and suicidal ideas.     Objective  Vitals:   01/25/20 1001  BP: 121/76  Pulse: 84  SpO2: 100%  Weight: 150 lb 12.8 oz (68.4 kg)  Height: 5' 3"  (1.6 m)    Body mass index is 26.71 kg/m.  Physical Exam Exam conducted with a chaperone present.  Constitutional:      Appearance: Normal appearance.  HENT:     Head: Normocephalic and atraumatic.     Right Ear: Hearing, tympanic membrane and external ear normal. There is no impacted cerumen.     Left Ear: Hearing, tympanic membrane and external ear normal. There is no impacted cerumen.  Eyes:     General: Lids are normal.     Conjunctiva/sclera: Conjunctivae normal.  Neck:     Vascular: No JVD.  Cardiovascular:     Rate and Rhythm: Normal rate and regular rhythm.     Heart sounds: No murmur heard.   Pulmonary:     Effort: No respiratory distress.     Breath sounds: Normal breath sounds. No stridor. No wheezing or rhonchi.  Chest:     Chest wall: No mass, lacerations, swelling or tenderness.     Breasts:        Right: Normal. No swelling,  bleeding, inverted nipple or mass.        Left: Normal. No swelling, bleeding, inverted nipple or mass.  Abdominal:     General: Abdomen is flat.     Palpations: There is no mass.     Tenderness: There is no abdominal tenderness. There is no rebound.     Hernia: No hernia is present.  Genitourinary:    Exam position: Lithotomy position.     Comments: No rash, lesion, or tenderness present on right and left labia. No vaginal discharge present.  No CMT or  cervical discharge. No masses or adnexal tenderness during bimanual exam.  Musculoskeletal:     Right lower leg: No edema.     Left lower leg: No edema.  Lymphadenopathy:     Upper Body:     Right upper body: No supraclavicular, axillary or pectoral adenopathy.     Left upper body: No supraclavicular, axillary or pectoral adenopathy.  Skin:    General: Skin is warm and dry.  Neurological:     Mental Status: She is alert and oriented to person, place, and time.     Gait: Gait is intact.     Deep Tendon Reflexes:     Reflex Scores:      Patellar reflexes are 2+ on the right side and 2+ on the left side. Psychiatric:        Mood and Affect: Mood normal.        Behavior: Behavior normal.     Fall Risk: Fall Risk  01/25/2020 12/22/2019 09/20/2019 02/15/2019 07/21/2018  Falls in the past year? 0 0 0 0 0  Risk for fall due to : No Fall Risks - No Fall Risks - -   CMP Latest Ref Rng & Units 09/21/2019 02/15/2019 07/22/2018  Glucose 65 - 99 mg/dL 195(H) 135(H) 109(H)  BUN 6 - 24 mg/dL 9 9 9   Creatinine 0.57 - 1.00 mg/dL 0.70 0.67 0.65  Sodium 134 - 144 mmol/L 143 139 142  Potassium 3.5 - 5.2 mmol/L 4.1 4.7 4.5  Chloride 96 - 106 mmol/L 100 103 102  CO2 20 - 29 mmol/L 21 22 22   Calcium 8.7 - 10.2 mg/dL 9.6 9.7 9.5  Total Protein 6.0 - 8.5 g/dL 7.7 7.1 6.9  Total Bilirubin 0.0 - 1.2 mg/dL 0.3 0.3 0.2  Alkaline Phos 48 - 121 IU/L 120 93 86  AST 0 - 40 IU/L 14 12 13   ALT 0 - 32 IU/L 17 11 11    Lipid Panel     Component Value Date/Time    CHOL 157 09/21/2019 0851   TRIG 49 09/21/2019 0851   HDL 64 09/21/2019 0851   CHOLHDL 2.5 09/21/2019 0851   CHOLHDL 3.7 02/01/2016 1703   VLDL 21 02/01/2016 1703   LDLCALC 83 09/21/2019 0851   LABVLDL 10 09/21/2019 0851     Assessment & Plan 1. Annual physical exam Discussed the importance of lifestyle modifications, including 150 minutes of physical activity, low-carbohydrate and  low-cholesterol diet, and weight loss. She is up to date on her health maintenance. She will try to schedule an eye appointment with [facility]. She declines mammogram screening at this time.   2. Screening for cervical cancer Screening complete during visit. Will follow with results of the PAP - Cytology - PAP([place])   Return in about 3 months (around 04/26/2020) for Chronic disease management.   Evaluation and management procedures were performed by me with Student in attendance, note written by NP student under my supervision and collaboration. I have reviewed the note and I agree with the management and plan. Discussed guidelines with regard to breast cancer screening but she would like to hold off at this time.   Charlott Rakes, MD, FAAFP. [facility] and [place], [place]   01/25/2020, 11:43 AM

## 2020-01-25 NOTE — Patient Instructions (Signed)
Mantenimiento de la salud en las mujeres Health Maintenance, Female Adoptar un estilo de vida saludable y recibir atencin preventiva son importantes para promover la salud y el bienestar. Consulte al mdico sobre:  El esquema adecuado para hacerse pruebas y exmenes peridicos.  Cosas que puede hacer por su cuenta para prevenir enfermedades y mantenerse sana. Qu debo saber sobre la dieta, el peso y el ejercicio? Consuma una dieta saludable   Consuma una dieta que incluya muchas verduras, frutas, productos lcteos con bajo contenido de grasa y protenas magras.  No consuma muchos alimentos ricos en grasas slidas, azcares agregados o sodio. Mantenga un peso saludable El ndice de masa muscular (IMC) se utiliza para identificar problemas de peso. Proporciona una estimacin de la grasa corporal basndose en el peso y la altura. Su mdico puede ayudarle a determinar su IMC y a lograr o mantener un peso saludable. Haga ejercicio con regularidad Haga ejercicio con regularidad. Esta es una de las prcticas ms importantes que puede hacer por su salud. La mayora de los adultos deben seguir estas pautas:  Realizar, al menos, 150minutos de actividad fsica por semana. El ejercicio debe aumentar la frecuencia cardaca y hacerlo transpirar (ejercicio de intensidad moderada).  Hacer ejercicios de fortalecimiento por lo menos dos veces por semana. Agregue esto a su plan de ejercicio de intensidad moderada.  Pasar menos tiempo sentados. Incluso la actividad fsica ligera puede ser beneficiosa. Controle sus niveles de colesterol y lpidos en la sangre Comience a realizarse anlisis de lpidos y colesterol en la sangre a los 20aos y luego reptalos cada 5aos. Hgase controlar los niveles de colesterol con mayor frecuencia si:  Sus niveles de lpidos y colesterol son altos.  Es mayor de 40aos.  Presenta un alto riesgo de padecer enfermedades cardacas. Qu debo saber sobre las pruebas de  deteccin del cncer? Segn su historia clnica y sus antecedentes familiares, es posible que deba realizarse pruebas de deteccin del cncer en diferentes edades. Esto puede incluir pruebas de deteccin de lo siguiente:  Cncer de mama.  Cncer de cuello uterino.  Cncer colorrectal.  Cncer de piel.  Cncer de pulmn. Qu debo saber sobre la enfermedad cardaca, la diabetes y la hipertensin arterial? Presin arterial y enfermedad cardaca  La hipertensin arterial causa enfermedades cardacas y aumenta el riesgo de accidente cerebrovascular. Es ms probable que esto se manifieste en las personas que tienen lecturas de presin arterial alta, tienen ascendencia africana o tienen sobrepeso.  Hgase controlar la presin arterial: ? Cada 3 a 5 aos si tiene entre 18 y 39 aos. ? Todos los aos si es mayor de 40aos. Diabetes Realcese exmenes de deteccin de la diabetes con regularidad. Este anlisis revisa el nivel de azcar en la sangre en ayunas. Hgase las pruebas de deteccin:  Cada tresaos despus de los 40aos de edad si tiene un peso normal y un bajo riesgo de padecer diabetes.  Con ms frecuencia y a partir de una edad inferior si tiene sobrepeso o un alto riesgo de padecer diabetes. Qu debo saber sobre la prevencin de infecciones? Hepatitis B Si tiene un riesgo ms alto de contraer hepatitis B, debe someterse a un examen de deteccin de este virus. Hable con el mdico para averiguar si tiene riesgo de contraer la infeccin por hepatitis B. Hepatitis C Se recomienda el anlisis a:  Todos los que nacieron entre 1945 y 1965.  Todas las personas que tengan un riesgo de haber contrado hepatitis C. Enfermedades de transmisin sexual (ETS)  Hgase las   pruebas de deteccin de ITS, incluidas la gonorrea y la clamidia, si: ? Es sexualmente activa y es menor de 24aos. ? Es mayor de 24aos, y el mdico le informa que corre riesgo de tener este tipo de  infecciones. ? La actividad sexual ha cambiado desde que le hicieron la ltima prueba de deteccin y tiene un riesgo mayor de tener clamidia o gonorrea. Pregntele al mdico si usted tiene riesgo.  Pregntele al mdico si usted tiene un alto riesgo de contraer VIH. El mdico tambin puede recomendarle un medicamento recetado para ayudar a evitar la infeccin por el VIH. Si elige tomar medicamentos para prevenir el VIH, primero debe hacerse los anlisis de deteccin del VIH. Luego debe hacerse anlisis cada 3meses mientras est tomando los medicamentos. Embarazo  Si est por dejar de menstruar (fase premenopusica) y usted puede quedar embarazada, busque asesoramiento antes de quedar embarazada.  Tome de 400 a 800microgramos (mcg) de cido flico todos los das si queda embarazada.  Pida mtodos de control de la natalidad (anticonceptivos) si desea evitar un embarazo no deseado. Osteoporosis y menopausia La osteoporosis es una enfermedad en la que los huesos pierden los minerales y la fuerza por el avance de la edad. El resultado pueden ser fracturas en los huesos. Si tiene 65aos o ms, o si est en riesgo de sufrir osteoporosis y fracturas, pregunte a su mdico si debe:  Hacerse pruebas de deteccin de prdida sea.  Tomar un suplemento de calcio o de vitamina D para reducir el riesgo de fracturas.  Recibir terapia de reemplazo hormonal (TRH) para tratar los sntomas de la menopausia. Siga estas instrucciones en su casa: Estilo de vida  No consuma ningn producto que contenga nicotina o tabaco, como cigarrillos, cigarrillos electrnicos y tabaco de mascar. Si necesita ayuda para dejar de fumar, consulte al mdico.  No consuma drogas.  No comparta agujas.  Solicite ayuda a su mdico si necesita apoyo o informacin para abandonar las drogas. Consumo de alcohol  No beba alcohol si: ? Su mdico le indica no hacerlo. ? Est embarazada, puede estar embarazada o est tratando de quedar  embarazada.  Si bebe alcohol: ? Limite la cantidad que consume de 0 a 1 medida por da. ? Limite la ingesta si est amamantando.  Est atento a la cantidad de alcohol que hay en las bebidas que toma. En [place], una medida equivale a una botella de cerveza de 12oz (355ml), un vaso de vino de 5oz (148ml) o un vaso de una bebida alcohlica de alta graduacin de 1oz (44ml). Instrucciones generales  Realcese los estudios de rutina de la salud, dentales y de la vista.  Mantngase al da con las vacunas.  Infrmele a su mdico si: ? Se siente deprimida con frecuencia. ? Alguna vez ha sido vctima de maltrato o no se siente segura en su casa. Resumen  Adoptar un estilo de vida saludable y recibir atencin preventiva son importantes para promover la salud y el bienestar.  Siga las instrucciones del mdico acerca de una dieta saludable, el ejercicio y la realizacin de pruebas o exmenes para detectar enfermedades.  Siga las instrucciones del mdico con respecto al control del colesterol y la presin arterial. Esta informacin no tiene como fin reemplazar el consejo del mdico. Asegrese de hacerle al mdico cualquier pregunta que tenga. Document Revised: 04/21/2018 Document Reviewed: 04/21/2018 Elsevier Patient Education  2020 Elsevier Inc.  

## 2020-01-25 NOTE — Progress Notes (Signed)
Has had covid vaccine but does not have card here.

## 2020-01-31 LAB — CYTOLOGY - PAP
Adequacy: ABNORMAL
Comment: NEGATIVE

## 2020-02-03 ENCOUNTER — Telehealth: Payer: Self-pay

## 2020-02-03 NOTE — Telephone Encounter (Signed)
-----   Message from Hoy Register, MD sent at 02/01/2020  1:23 PM EDT ----- Lab could not read her specimen.  Recollection recommended.  Please schedule for repeat Pap

## 2020-02-03 NOTE — Telephone Encounter (Signed)
A voicemail was left informing patient to make another appointment for pap smear.

## 2020-02-17 ENCOUNTER — Other Ambulatory Visit: Payer: Self-pay

## 2020-02-17 ENCOUNTER — Ambulatory Visit: Payer: Self-pay | Attending: Family | Admitting: Family

## 2020-02-17 VITALS — BP 112/69 | HR 84 | Temp 98.6°F | Ht 63.0 in | Wt 152.0 lb

## 2020-02-17 DIAGNOSIS — Z789 Other specified health status: Secondary | ICD-10-CM

## 2020-02-17 DIAGNOSIS — Z124 Encounter for screening for malignant neoplasm of cervix: Secondary | ICD-10-CM

## 2020-02-17 DIAGNOSIS — Z1231 Encounter for screening mammogram for malignant neoplasm of breast: Secondary | ICD-10-CM

## 2020-02-17 DIAGNOSIS — Z113 Encounter for screening for infections with a predominantly sexual mode of transmission: Secondary | ICD-10-CM

## 2020-02-17 NOTE — Patient Instructions (Addendum)
PAP today. Will call with results.   PAP hoy. Llamar con resultados.   Prueba de Papanicolaou Pap Test Por qu me debo realizar esta prueba? La prueba de Papanicolaou, tambin denominada citologa vaginal, es una prueba de cribado para Engineer, manufacturing signos de:  Cncer de la vagina, del cuello uterino y del tero. El cuello uterino es la parte baja del tero que se abre hacia la vagina.  Infeccin.  Cambios que podran ser un signo de que se est desarrollando un cncer (cambios precancerosos). Las mujeres deben realizarse esta prueba con regularidad. En general, debe hacerse una prueba de Papanicolaou cada 3 aos hasta alcanzar la menopausia o hasta los 65 aos. Las [employer] 30 y 60 aos de edad pueden elegir realizarse la prueba de Papanicolaou al mismo tiempo que la prueba del VPH (virus del papiloma humano) cada 5 aos (en lugar de cada 3 aos). El mdico puede recomendarle que se realice pruebas de Papanicolaou con ms o menos frecuencia en funcin de sus afecciones mdicas y los resultados de la prueba de Papanicolaou anterior. Qu tipo de [place] se toma?  El mdico recolectar una muestra de clulas de la superficie del cuello uterino. Lo har utilizando un pequeo hisopo de algodn, una esptula de plstico o un cepillo. Esta muestra se recolecta durante un examen plvico, mientras usted est recostada boca arriba sobre la mesa de examen con los pies en los descansos para pies (estribos). En algunos casos, tambin pueden recolectarse fluidos (secreciones) del cuello uterino y la vagina. Cmo debo prepararme para esta prueba?  Tenga en cuenta en qu etapa del ciclo menstrual se encuentra. Es posible que se le pida que vuelva a Charity fundraiser la prueba si est Magazine features editor en que debe Futures trader.  Si el da en que debe realizarse la prueba tiene una infeccin vaginal aparente, deber volver a Nurse, learning disability prueba.  Siga las indicaciones del mdico acerca de lo  siguiente: ? Cambiar o suspender los medicamentos que toma habitualmente. Algunos medicamentos pueden [employer] de la prueba, como los digitlicos y Regulatory affairs officer. ? Evite las duchas vaginales o los baos de inmersin el da de la prueba o Medical laboratory scientific officer anterior. Informe al mdico acerca de lo siguiente:  Cualquier alergia que tenga.  Todos los [employer], incluidos vitaminas, hierbas, gotas oftlmicas, cremas y [address] de [address].  Cualquier enfermedad de la sangre que tenga.  Cirugas a las que se someti.  Cualquier afeccin mdica que tenga.  Si est embarazada o podra estarlo. Cmo se informan los resultados? Los [employer] de la prueba se informarn como anormales o normales. Puede producirse un resultado positivo falso. Este tipo de resultado es incorrecto porque indica que una enfermedad est presente cuando en realidad no lo est. Puede producirse un resultado negativo falso. Este tipo de resultado es incorrecto porque indica que una enfermedad no est presente cuando en realidad lo est. Qu significan los Sarah Ann? Un resultado normal en la prueba significa que no tiene signos de cncer de la vagina, del cuello uterino o del tero. Un resultado anormal puede significar que tiene:  Cncer. Una prueba de Papanicolaou por s sola no es suficiente para Consulting civil engineer. En este caso, se le realizarn ms pruebas.  Cambios precancerosos en la vagina, cuello uterino o tero.  Inflamacin del cuello uterino.  Enfermedades de transmisin sexual (ETS).  Infecciones por hongos.  Infecciones por parsitos. Hable con su mdico sobre lo que significan sus [place]. Preguntas para hacerle al mdico Consulte a su  mdico o pregunte en el departamento donde se realiza la prueba acerca de lo siguiente:  Cundo estarn disponibles mis resultados?  Cmo obtendr mis resultados?  Cules son mis opciones de tratamiento?  Qu otras  pruebas necesito?  Cules son los prximos pasos que debo seguir? Resumen  En general, las mujeres deben hacerse una prueba de Papanicolaou cada 3 aos Engineer, maintenance la menopausia o [employer] 65 aos de [place].  El mdico recolectar una muestra de clulas de la superficie del cuello uterino. Lo har utilizando un pequeo hisopo de algodn, una esptula de plstico o un cepillo.  En algunos casos, tambin pueden recolectarse fluidos (secreciones) del cuello uterino y la vagina. Esta informacin no tiene Theme park manager el consejo del mdico. Asegrese de hacerle al mdico cualquier pregunta que tenga. Document Revised: 03/10/2017 Document Reviewed: 03/10/2017 Elsevier Patient Education  2020 [employer].

## 2020-02-17 NOTE — Progress Notes (Signed)
Needs to repeat pap due to specimen not being enough.

## 2020-02-17 NOTE — Progress Notes (Signed)
Patient ID: Rachael Russo, female    DOB: [birth year]  MRN: [id number]  CC: Gynecologic Exam  Subjective: Rachael Russo is a 44 y.o. female with history of poor dentition, Graves disease, uncontrolled type 2 diabetes mellitus without complication with long-term current use of insulin, and hyperlipidemia who presents for repeat PAP smear.   1. PAP SMEAR: Age at menarche: 40 or 44 years old  LMP: September 2021 Menses duration, flow, frequency: 4 or 5 days, moderate flow, once monthly usually  Pregnancies: 4  Births: 3 Abortions: 0   Miscarriages: 1 Sexual concerns: denies   Sexually active: yes  Contraception use past, present: denies condoms and had tubal ligation Vaginal discharge: denies Vaginal lesions: denies  Pelvic pain: denies  Previous GYN procedures: tubal ligation, cesarean section  Last PAP: 09/26/2016 Results of last PAP: negative for lesions and malignancy Testing for STIs today? declines Testing for HIV today? declines Mammogram: never had one  Patient Active Problem List   Diagnosis Date Noted  . Hyperlipidemia 11/24/2017  . Vaginal discharge 09/26/2016  . Uncontrolled type 2 diabetes mellitus without complication, with long-term current use of insulin 07/26/2015  . Finger numbness 12/07/2014  . Poor dentition 10/27/2014  . Graves disease 06/12/2014  . S/P tubal ligation 06/12/2014  . Language barrier, speaks Spanish only and requires interpreter 05/09/2013     Current Outpatient Medications on File Prior to Visit  Medication Sig Dispense Refill  . atorvastatin (LIPITOR) 10 MG tablet Take 1 tablet (10 mg total) by mouth daily. 30 tablet 6  . Blood Glucose Monitoring Suppl (TRUE METRIX METER) w/Device KIT 1 each by Does not apply route 3 (three) times daily. 1 kit 0  . glucose blood (TRUE METRIX BLOOD GLUCOSE TEST) test strip 1 each by Other route 3 (three) times daily. 100 each 3  . ibuprofen (ADVIL) 600 MG tablet Take 1 tablet (600 mg  total) by mouth every 8 (eight) hours as needed. 60 tablet 3  . insulin NPH-regular Human (HUMULIN 70/30) (70-30) 100 UNIT/ML injection INJECT 17 UNITS INTO THE SKIN 2 (TWO) TIMES DAILY WITH A MEAL. 30 mL 6  . lisinopril (ZESTRIL) 2.5 MG tablet Take 1 tablet (2.5 mg total) by mouth daily. 90 tablet 1  . metFORMIN (GLUCOPHAGE-XR) 500 MG 24 hr tablet Take 2 tablets (1,000 mg total) by mouth 2 (two) times daily. 120 tablet 6  . methimazole (TAPAZOLE) 5 MG tablet Take 1 tablet (5 mg total) by mouth 2 (two) times daily. 60 tablet 6  . metoprolol succinate (TOPROL-XL) 25 MG 24 hr tablet Take 1 tablet (25 mg total) by mouth daily. 30 tablet 6  . TRUEplus Lancets 28G MISC 1 each by Does not apply route 2 (two) times daily. 100 each 6  . gabapentin (NEURONTIN) 300 MG capsule Take 1 capsule (300 mg total) by mouth at bedtime. (Patient not taking: Reported on 12/22/2019) 90 capsule 1  . Prenatal Vit-Fe Fumarate-FA (PRENATAL MULTIVITAMIN) TABS tablet Take 1 tablet by mouth daily. (Patient not taking: Reported on 02/15/2019) 30 tablet 2   No current facility-administered medications on file prior to visit.    No Known Allergies  Social History   Socioeconomic History  . Marital status: Married    Spouse name: Not on file  . Number of children: Not on file  . Years of education: Not on file  . Highest education level: Not on file  Occupational History  . Not on file  Tobacco Use  . Smoking status:  Never Smoker  . Smokeless tobacco: Never Used  Substance and Sexual Activity  . Alcohol use: No  . Drug use: No  . Sexual activity: Yes    Birth control/protection: Condom, Surgical  Other Topics Concern  . Not on file  Social History Narrative  . Not on file   Social Determinants of Health   Financial Resource Strain:   . Difficulty of Paying Living Expenses: Not on file  Food Insecurity:   . Worried About Charity fundraiser in the Last Year: Not on file  . Ran Out of Food in the Last Year: Not  on file  Transportation Needs:   . Lack of Transportation (Medical): Not on file  . Lack of Transportation (Non-Medical): Not on file  Physical Activity:   . Days of Exercise per Week: Not on file  . Minutes of Exercise per Session: Not on file  Stress:   . Feeling of Stress : Not on file  Social Connections:   . Frequency of Communication with Friends and Family: Not on file  . Frequency of Social Gatherings with Friends and Family: Not on file  . Attends Religious Services: Not on file  . Active Member of Clubs or Organizations: Not on file  . Attends Archivist Meetings: Not on file  . Marital Status: Not on file  Intimate Partner Violence:   . Fear of Current or Ex-Partner: Not on file  . Emotionally Abused: Not on file  . Physically Abused: Not on file  . Sexually Abused: Not on file    Family History  Problem Relation Age of Onset  . Diabetes Mother     Past Surgical History:  Procedure Laterality Date  . CESAREAN SECTION    . CESAREAN SECTION N/A 12/03/2013   Procedure: CESAREAN SECTION;  Surgeon: [name], MD;  Location: [facility];  Service: Obstetrics;  Laterality: N/A;    ROS: Review of Systems Negative except as stated above  PHYSICAL EXAM: BP 112/69   Pulse 84   Temp 98.6 F (37 C) (Oral)   Ht 5' 3"  (1.6 m)   Wt 152 lb (68.9 kg)   SpO2 99%   BMI 26.93 kg/m   Physical Exam General appearance - alert, well appearing, and in no distress and oriented to person, place, and time Mental status - alert, oriented to person, place, and time, normal mood, behavior, speech, dress, motor activity, and thought processes Neck - supple, no significant adenopathy Lymphatics - no palpable lymphadenopathy, no hepatosplenomegaly Chest - clear to auscultation, no wheezes, rales or rhonchi, symmetric air entry, no tachypnea, retractions or cyanosis Heart - normal rate, regular rhythm, normal S1, S2, no murmurs, rubs, clicks or gallops Breasts - patient declines  to have breast exam Pelvic - normal external genitalia, vulva, vagina, cervix, uterus and adnexa, VULVA: normal appearing vulva with no masses, tenderness or lesions, VAGINA: normal appearing vagina with normal color and discharge, no lesions, CERVIX: normal appearing cervix without discharge or lesions, UTERUS: uterus is normal size, shape, consistency and nontender, ADNEXA: normal adnexa in size, nontender and no masses, no palpable internal organs, exam chaperoned by Doloris Hall, CMA Neurological - alert, oriented, normal speech, no focal findings or movement disorder noted  ASSESSMENT AND PLAN: 1. Screening for cervical cancer: - Today patient reports for repeat PAP smear after unsatisfactory adequacy for evaluation on 01/25/2020. - PAP smear to screen for cervical cancer.  - Cytology - PAP([facility])  2. Routine screening for STI (sexually  transmitted infection): - Declined.  3. Encounter for screening mammogram for malignant neoplasm of breast: - Declined referral for mammogram.  4. Language barrier: - [employer] participated during today's visit. Interpreter Name: Fabio Bering, ID#: [id number]   Patient was given the opportunity to ask questions.  Patient verbalized understanding of the plan and was able to repeat key elements of the plan. Patient was given clear instructions to go to Emergency Department or return to medical center if symptoms don't improve, worsen, or new problems develop.The patient verbalized understanding.   No orders of the defined types were placed in this encounter.    Requested Prescriptions    No prescriptions requested or ordered in this encounter    No follow-ups on file.  Camillia Herter, NP

## 2020-02-21 LAB — CYTOLOGY - PAP
Comment: NEGATIVE
High risk HPV: NEGATIVE

## 2020-02-22 ENCOUNTER — Encounter: Payer: Self-pay | Admitting: Family Medicine

## 2020-02-23 NOTE — Progress Notes (Signed)
Please call patient with update.   PAP smear has an abnormal lesion which means this may lead to cervical cancer if not properly monitored and managed. No immediate action is needed at this time. However, a repeat PAP smear is recommended in 1 year. Follow-up with primary physician as scheduled.

## 2020-02-24 ENCOUNTER — Telehealth: Payer: Self-pay

## 2020-02-24 NOTE — Telephone Encounter (Signed)
-----   Message from Rema Fendt, NP sent at 02/23/2020  9:13 AM EST ----- Please call patient with update.   PAP smear has an abnormal lesion which means this may lead to cervical cancer if not properly monitored and managed. No immediate action is needed at this time. However, a repeat PAP smear is recommended in 1 year. Follow-up with primary physician as  scheduled.

## 2020-02-24 NOTE — Telephone Encounter (Signed)
Patient name and DOB has been verified Patient was informed of lab results. Patient had no questions.  

## 2020-04-26 ENCOUNTER — Other Ambulatory Visit: Payer: Self-pay

## 2020-04-26 ENCOUNTER — Ambulatory Visit: Payer: Self-pay | Attending: Family Medicine | Admitting: Family Medicine

## 2020-04-26 ENCOUNTER — Other Ambulatory Visit: Payer: Self-pay | Admitting: Family Medicine

## 2020-04-26 DIAGNOSIS — E0500 Thyrotoxicosis with diffuse goiter without thyrotoxic crisis or storm: Secondary | ICD-10-CM

## 2020-04-26 DIAGNOSIS — E1141 Type 2 diabetes mellitus with diabetic mononeuropathy: Secondary | ICD-10-CM

## 2020-04-26 DIAGNOSIS — M65332 Trigger finger, left middle finger: Secondary | ICD-10-CM

## 2020-04-26 DIAGNOSIS — Z794 Long term (current) use of insulin: Secondary | ICD-10-CM

## 2020-04-26 DIAGNOSIS — E7800 Pure hypercholesterolemia, unspecified: Secondary | ICD-10-CM

## 2020-04-26 MED ORDER — PREDNISONE 20 MG PO TABS: 20.0000 mg | ORAL_TABLET | Freq: Every day | ORAL | 0 refills | Status: DC

## 2020-04-26 MED ORDER — METFORMIN HCL ER 500 MG PO TB24
1000.0000 mg | ORAL_TABLET | Freq: Two times a day (BID) | ORAL | 6 refills | Status: DC
  Filled 2020-08-10: qty 120, 30d supply, fill #0
  Filled 2020-09-12: qty 120, 30d supply, fill #1
  Filled 2020-10-12: qty 120, 30d supply, fill #2
  Filled 2020-11-12: qty 120, 30d supply, fill #3
  Filled 2020-12-18: qty 120, 30d supply, fill #4
  Filled 2021-01-14: qty 120, 30d supply, fill #5

## 2020-04-26 MED ORDER — LISINOPRIL 2.5 MG PO TABS
2.5000 mg | ORAL_TABLET | Freq: Every day | ORAL | 1 refills | Status: DC
  Filled 2020-08-10: qty 30, 30d supply, fill #0
  Filled 2020-09-12: qty 30, 30d supply, fill #1
  Filled 2020-10-12: qty 30, 30d supply, fill #2

## 2020-04-26 MED ORDER — ATORVASTATIN CALCIUM 10 MG PO TABS
10.0000 mg | ORAL_TABLET | Freq: Every day | ORAL | 6 refills | Status: DC
  Filled 2020-08-10: qty 30, 30d supply, fill #0
  Filled 2020-09-12: qty 30, 30d supply, fill #1
  Filled 2020-10-12: qty 30, 30d supply, fill #2
  Filled 2020-11-12: qty 30, 30d supply, fill #3
  Filled 2020-12-18 (×4): qty 30, 30d supply, fill #4
  Filled 2021-01-14 – 2021-01-15 (×2): qty 30, 30d supply, fill #5

## 2020-04-26 MED ORDER — HUMULIN 70/30 (70-30) 100 UNIT/ML ~~LOC~~ SUSP: SUBCUTANEOUS | 6 refills | Status: DC

## 2020-04-26 MED ORDER — METOPROLOL SUCCINATE ER 25 MG PO TB24
25.0000 mg | ORAL_TABLET | Freq: Every day | ORAL | 6 refills | Status: DC
  Filled 2020-08-10: qty 30, 30d supply, fill #0
  Filled 2020-09-12: qty 30, 30d supply, fill #1
  Filled 2020-10-12: qty 30, 30d supply, fill #2
  Filled 2020-11-12: qty 30, 30d supply, fill #3
  Filled 2020-12-18: qty 30, 30d supply, fill #4
  Filled 2021-01-14: qty 30, 30d supply, fill #5

## 2020-04-26 NOTE — Progress Notes (Signed)
Having pain in finger on left hand, states that she can not bend the finger.

## 2020-04-26 NOTE — Progress Notes (Signed)
Virtual Visit via Telephone Note  I connected with Rachael Russo, on 04/26/2020 at 9:25 AM by telephone due to the COVID-19 pandemic and verified that I am speaking with the correct person using two identifiers.   Consent: I discussed the limitations, risks, security and privacy concerns of performing an evaluation and management service by telephone and the availability of in person appointments. I also discussed with the patient that there may be a patient responsible charge related to this service. The patient expressed understanding and agreed to proceed.   Location of Patient: Home  Location of Provider: Home   Persons participating in Telemedicine visit: Rachael Russo ID # [id number] Rachael Russo, Interpreter Dr. Margarita Rana     History of Present Illness: Rachael Russo is a 45 year old female with a history of Graves Disease,Type 2 Diabetes Mellitus presents(A1c 7.8) seen for a follow up visit.  Her Left middle finger has been difficult to bend for the last 2 months and she denies any history of trauma. Bending is limited and she feels the finger is stiff but at the moment finger is normal. Symptoms are worse at night.  She has been compliant with her insulin and antihypertensives. Also doing well on her Statin.  Denies additional concerns today.  Past Medical History:  Diagnosis Date  . AMA (advanced maternal age) multigravida 35+ 05/09/2013   Offered genetic screening, discussed options, patient desires quad screen Clinic [facility]  Dating LMP consistent with 10 week scan  Genetic Screen  Quad:                  NIPS:  Anatomic [place]   GTT Type II DM  TDaP vaccine   Flu vaccine   GBS   Baby Food Breast  Contraception Condoms  Pediatrician [place]  Support Person Mother      . AMA (advanced maternal age) multigravida 35+ 05/09/2013   Offered genetic screening, discussed options, patient desires quad screen Clinic [facility]  Dating LMP consistent  with 10 week scan  Genetic Screen  Quad normal  Anatomic [place] Limited, will reevaluate on repeat scans  GTT Type II DM  TDaP vaccine   Flu vaccine   GBS   Baby Food Breast  Contraception Condoms  Pediatrician [place]  Support Person Mother      . Diabetes mellitus   . LGA (large for gestational age) fetus affecting mother, antepartum 11/17/2013   11/16/2013 EFW 3747gm >90%tile    No Known Allergies  Current Outpatient Medications on File Prior to Visit  Medication Sig Dispense Refill  . atorvastatin (LIPITOR) 10 MG tablet Take 1 tablet (10 mg total) by mouth daily. 30 tablet 6  . Blood Glucose Monitoring Suppl (TRUE METRIX METER) w/Device KIT 1 each by Does not apply route 3 (three) times daily. 1 kit 0  . glucose blood (TRUE METRIX BLOOD GLUCOSE TEST) test strip 1 each by Other route 3 (three) times daily. 100 each 3  . ibuprofen (ADVIL) 600 MG tablet Take 1 tablet (600 mg total) by mouth every 8 (eight) hours as needed. 60 tablet 3  . insulin NPH-regular Human (HUMULIN 70/30) (70-30) 100 UNIT/ML injection INJECT 17 UNITS INTO THE SKIN 2 (TWO) TIMES DAILY WITH A MEAL. 30 mL 6  . lisinopril (ZESTRIL) 2.5 MG tablet Take 1 tablet (2.5 mg total) by mouth daily. 90 tablet 1  . metFORMIN (GLUCOPHAGE-XR) 500 MG 24 hr tablet Take 2 tablets (1,000 mg total) by mouth 2 (two)  times daily. 120 tablet 6  . methimazole (TAPAZOLE) 5 MG tablet Take 1 tablet (5 mg total) by mouth 2 (two) times daily. 60 tablet 6  . metoprolol succinate (TOPROL-XL) 25 MG 24 hr tablet Take 1 tablet (25 mg total) by mouth daily. 30 tablet 6  . TRUEplus Lancets 28G MISC 1 each by Does not apply route 2 (two) times daily. 100 each 6  . gabapentin (NEURONTIN) 300 MG capsule Take 1 capsule (300 mg total) by mouth at bedtime. (Patient not taking: No sig reported) 90 capsule 1  . Prenatal Vit-Fe Fumarate-FA (PRENATAL MULTIVITAMIN) TABS tablet Take 1 tablet by mouth daily. (Patient not taking: No sig reported) 30 tablet 2   No  current facility-administered medications on file prior to visit.    Observations/Objective: Awake, alert, oriented x3 Not in acute distress  Lab Results  Component Value Date   HGBA1C 7.8 (A) 12/22/2019    Assessment and Plan: 1. Graves disease Will send off thyroid labs and adjust regimen accordingly. - metoprolol succinate (TOPROL-XL) 25 MG 24 hr tablet; Take 1 tablet (25 mg total) by mouth daily.  Dispense: 30 tablet; Refill: 6 - T4, free - TSH  2. 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 Advised on the need for annual eye exams, annual foot exams, Pneumonia vaccine. - metFORMIN (GLUCOPHAGE-XR) 500 MG 24 hr tablet; Take 2 tablets (1,000 mg total) by mouth 2 (two) times daily.  Dispense: 120 tablet; Refill: 6 - lisinopril (ZESTRIL) 2.5 MG tablet; Take 1 tablet (2.5 mg total) by mouth daily.  Dispense: 90 tablet; Refill: 1 - insulin NPH-regular Human (HUMULIN 70/30) (70-30) 100 UNIT/ML injection; INJECT 17 UNITS INTO THE SKIN 2 (TWO) TIMES DAILY WITH A MEAL.  Dispense: 30 mL; Refill: 6 - CMP14+EGFR - Lipid panel - Hemoglobin A1c  3. Pure hypercholesterolemia Controlled Low cholesterol diet - atorvastatin (LIPITOR) 10 MG tablet; Take 1 tablet (10 mg total) by mouth daily.  Dispense: 30 tablet; Refill: 6  4. Trigger middle finger of left hand Advised that if symptoms do not improve in 2 weeks she needs to call the Clinic and she will be referred to Ortho for Cortisone injection. - predniSONE (DELTASONE) 20 MG tablet; Take 1 tablet (20 mg total) by mouth  daily with breakfast.  Dispense: 5 tablet; Refill: 0   Follow Up Instructions: 3 months   I discussed the assessment and treatment plan with the patient. The patient was provided an opportunity to ask questions and all were answered. The patient agreed with the plan and demonstrated an understanding of the instructions.   The patient was advised to call back or seek an in-person evaluation if the symptoms worsen or if the condition fails to improve as anticipated.     I provided 13 minutes total of non-face-to-face time during this encounter including median intraservice time, reviewing previous notes, investigations, ordering medications, medical decision making, coordinating care and patient verbalized understanding at the end of the visit.     Charlott Rakes, MD, FAAFP. [facility] [place], [place]   04/26/2020, 9:25 AM

## 2020-04-27 ENCOUNTER — Other Ambulatory Visit: Payer: Self-pay | Admitting: Family Medicine

## 2020-04-27 DIAGNOSIS — E0500 Thyrotoxicosis with diffuse goiter without thyrotoxic crisis or storm: Secondary | ICD-10-CM

## 2020-04-27 LAB — LIPID PANEL
Chol/HDL Ratio: 2.4 ratio (ref 0.0–4.4)
Cholesterol, Total: 165 mg/dL (ref 100–199)
HDL: 69 mg/dL (ref >39)
LDL Chol Calc (NIH): 87 mg/dL (ref 0–99)
Triglycerides: 44 mg/dL (ref 0–149)
VLDL Cholesterol Cal: 9 mg/dL (ref 5–40)

## 2020-04-27 LAB — CMP14+EGFR
ALT: 17 IU/L (ref 0–32)
AST: 16 IU/L (ref 0–40)
Albumin/Globulin Ratio: 2.2 (ref 1.2–2.2)
Albumin: 5.1 g/dL — ABNORMAL HIGH (ref 3.8–4.8)
Alkaline Phosphatase: 104 IU/L (ref 44–121)
BUN/Creatinine Ratio: 14 (ref 9–23)
BUN: 10 mg/dL (ref 6–24)
Bilirubin Total: 0.3 mg/dL (ref 0.0–1.2)
CO2: 21 mmol/L (ref 20–29)
Calcium: 9.9 mg/dL (ref 8.7–10.2)
Chloride: 103 mmol/L (ref 96–106)
Creatinine, Ser: 0.74 mg/dL (ref 0.57–1.00)
GFR calc Af Amer: 114 mL/min/{1.73_m2} (ref >59)
GFR calc non Af Amer: 99 mL/min/{1.73_m2} (ref >59)
Globulin, Total: 2.3 g/dL (ref 1.5–4.5)
Glucose: 135 mg/dL — ABNORMAL HIGH (ref 65–99)
Potassium: 4.7 mmol/L (ref 3.5–5.2)
Sodium: 142 mmol/L (ref 134–144)
Total Protein: 7.4 g/dL (ref 6.0–8.5)

## 2020-04-27 LAB — T4, FREE: Free T4: 1.06 ng/dL (ref 0.82–1.77)

## 2020-04-27 LAB — HEMOGLOBIN A1C
Est. average glucose Bld gHb Est-mCnc: 163 mg/dL
Hgb A1c MFr Bld: 7.3 % — ABNORMAL HIGH (ref 4.8–5.6)

## 2020-04-27 LAB — TSH: TSH: 2.81 u[IU]/mL (ref 0.450–4.500)

## 2020-04-27 MED ORDER — METHIMAZOLE 5 MG PO TABS
5.0000 mg | ORAL_TABLET | Freq: Two times a day (BID) | ORAL | 6 refills | Status: DC
  Filled 2020-08-10: qty 60, 30d supply, fill #0
  Filled 2020-09-12: qty 60, 30d supply, fill #1
  Filled 2020-10-12: qty 60, 30d supply, fill #2
  Filled 2020-11-12: qty 60, 30d supply, fill #3
  Filled 2020-12-18: qty 60, 30d supply, fill #4
  Filled 2021-01-14: qty 60, 30d supply, fill #5

## 2020-06-01 ENCOUNTER — Other Ambulatory Visit: Payer: Self-pay

## 2020-06-01 ENCOUNTER — Ambulatory Visit: Payer: Self-pay | Attending: Family Medicine

## 2020-07-16 ENCOUNTER — Other Ambulatory Visit: Payer: Self-pay

## 2020-07-16 ENCOUNTER — Encounter: Payer: Self-pay | Admitting: Nurse Practitioner

## 2020-07-16 ENCOUNTER — Ambulatory Visit: Payer: Self-pay | Attending: Nurse Practitioner | Admitting: Nurse Practitioner

## 2020-07-16 VITALS — BP 145/77 | HR 76 | Resp 18 | Ht 62.0 in | Wt 150.0 lb

## 2020-07-16 DIAGNOSIS — Z794 Long term (current) use of insulin: Secondary | ICD-10-CM

## 2020-07-16 DIAGNOSIS — E0500 Thyrotoxicosis with diffuse goiter without thyrotoxic crisis or storm: Secondary | ICD-10-CM

## 2020-07-16 DIAGNOSIS — E1141 Type 2 diabetes mellitus with diabetic mononeuropathy: Secondary | ICD-10-CM

## 2020-07-16 DIAGNOSIS — M65332 Trigger finger, left middle finger: Secondary | ICD-10-CM

## 2020-07-16 LAB — GLUCOSE, POCT (MANUAL RESULT ENTRY): POC Glucose: 208 mg/dl — AB (ref 70–99)

## 2020-07-16 MED ORDER — GABAPENTIN 300 MG PO CAPS
300.0000 mg | ORAL_CAPSULE | Freq: Every day | ORAL | 1 refills | Status: DC
  Filled 2020-07-16: qty 30, 30d supply, fill #0
  Filled 2020-08-20: qty 30, 30d supply, fill #1
  Filled 2020-09-24: qty 30, 30d supply, fill #2
  Filled 2020-11-30: qty 30, 30d supply, fill #3
  Filled 2021-01-03: qty 30, 30d supply, fill #4
  Filled 2021-01-28: qty 30, 30d supply, fill #5

## 2020-07-16 NOTE — Progress Notes (Signed)
Assessment & Plan:  Annemarie was seen today for trigger finger.  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 -     Cancel: Hemoglobin A1c -     Hemoglobin A1c; Future Lab Results  Component Value Date   HGBA1C 7.3 (H) 04/26/2020    Graves disease -     Cancel: T4, Free -     Cancel: TSH -     T4, Free; Future -     TSH; Future Lab Results  Component Value Date   TSH 2.810 04/26/2020     Patient has been counseled on age-appropriate routine health concerns for screening and prevention. These are reviewed and up-to-date. Referrals have been placed accordingly. Immunizations are up-to-date or declined.    Subjective:   Chief Complaint  Patient presents with  . TRIGGER FINGER   HPI Rachael Russo 45 y.o. female presents to office today for follow up to trigger finger of middle left hand with no improvement after trying prednisone.  She has a past medical history of Graves' disease and type 2 diabetes mellitus VRI was used to communicate directly with patient for the entire encounter including providing detailed patient instructions.   Trigger Finger She had a telephone visit with her primary care on April 26, 2020 with complaints of difficulty bending the left middle finger over the past few months.  She denied any trauma or injury at that time.  She was prescribed prednisone which today she reports was ineffective and improving mobility of the finger.  There is pain, stiffness and locking at times which is worse at night.   She presents today for evaluation and at this time will need to be referred to the  hand specialist.  I did refill her gabapentin to help with some of the pain she is experiencing.     Review of Systems  Constitutional: Negative for fever, malaise/fatigue and weight loss.  HENT: Negative.  Negative for nosebleeds.   Eyes: Negative.  Negative for blurred vision, double vision and photophobia.  Respiratory: Negative.  Negative for cough and shortness of breath.   Cardiovascular: Negative.  Negative for chest pain, palpitations and leg swelling.  Gastrointestinal: Negative.  Negative for heartburn, nausea and vomiting.  Musculoskeletal: Positive for joint pain. Negative for myalgias.       See HPI  Neurological: Negative.  Negative for dizziness, focal weakness, seizures and headaches.  Psychiatric/Behavioral: Negative.  Negative for suicidal ideas.    Past Medical History:  Diagnosis Date  . AMA (advanced maternal age) multigravida 35+ 05/09/2013   Offered genetic screening, discussed options, patient desires quad screen Clinic [facility]  Dating LMP consistent with 10 week scan  Genetic Screen  Quad:                  NIPS:  Anatomic [place]   GTT Type II DM  TDaP vaccine   Flu vaccine   GBS   Baby Food Breast  Contraception Condoms  Pediatrician [place]  Support Person Mother      . AMA (advanced maternal age)  multigravida 35+ 05/09/2013   Offered genetic screening, discussed options, patient desires quad screen Clinic [facility]  Dating LMP consistent with 10 week scan  Genetic Screen  Quad normal  Anatomic US Limited, will reevaluate on repeat scans  GTT Type II DM  TDaP vaccine   Flu vaccine   GBS   Baby Food Breast  Contraception Condoms  Pediatrician [name]  Support Person Mother      . Diabetes mellitus   . LGA (large for gestational age) fetus affecting mother, antepartum 11/17/2013   11/16/2013 EFW 3747gm >90%tile     Past Surgical History:  Procedure Laterality Date  . CESAREAN SECTION    . CESAREAN SECTION N/A 12/03/2013   Procedure: CESAREAN SECTION;   Surgeon: Myra C Dove, MD;  Location: [facility];  Service: Obstetrics;  Laterality: N/A;    Family History  Problem Relation Age of Onset  . Diabetes Mother     Social History Reviewed with no changes to be made today.   Outpatient Medications Prior to Visit  Medication Sig Dispense Refill  . atorvastatin (LIPITOR) 10 MG tablet Take 1 tablet (10 mg total) by mouth daily. 30 tablet 6  . Blood Glucose Monitoring Suppl (TRUE METRIX METER) w/Device KIT 1 each by Does not apply route 3 (three) times daily. 1 kit 0  . glucose blood (TRUE METRIX BLOOD GLUCOSE TEST) test strip 1 each by Other route 3 (three) times daily. 100 each 3  . ibuprofen (ADVIL) 600 MG tablet Take 1 tablet (600 mg total) by mouth every 8 (eight) hours as needed. 60 tablet 3  . insulin NPH-regular Human (HUMULIN 70/30) (70-30) 100 UNIT/ML injection INJECT 17 UNITS INTO THE SKIN 2 (TWO) TIMES DAILY WITH A MEAL. 30 mL 6  . lisinopril (ZESTRIL) 2.5 MG tablet Take 1 tablet (2.5 mg total) by mouth daily. 90 tablet 1  . metFORMIN (GLUCOPHAGE-XR) 500 MG 24 hr tablet Take 2 tablets (1,000 mg total) by mouth 2 (two) times daily. 120 tablet 6  . methimazole (TAPAZOLE) 5 MG tablet Take 1 tablet (5 mg total) by mouth 2 (two) times daily. 60 tablet 6  . metoprolol succinate (TOPROL-XL) 25 MG 24 hr tablet Take 1 tablet (25 mg total) by mouth daily. 30 tablet 6  . predniSONE (DELTASONE) 20 MG tablet Take 1 tablet (20 mg total) by mouth daily with breakfast. 5 tablet 0  . Prenatal Vit-Fe Fumarate-FA (PRENATAL MULTIVITAMIN) TABS tablet Take 1 tablet by mouth daily. 30 tablet 2  . TRUEplus Lancets 28G MISC 1 each by Does not apply route 2 (two) times daily. 100 each 6  . gabapentin (NEURONTIN) 300 MG capsule Take 1 capsule (300 mg total) by mouth at bedtime. 90 capsule 1   No facility-administered medications prior to visit.    No Known Allergies     Objective:    BP (!) 145/77   Pulse 76   Resp 18   Ht 5' 2" (1.575 m)   Wt 150 lb (68  kg)   SpO2 99%   BMI 27.44 kg/m  Wt Readings from Last 3 Encounters:  07/16/20 150 lb (68 kg)  02/17/20 152 lb (68.9 kg)  01/25/20 150 lb 12.8 oz (68.4 kg)    Physical Exam Vitals and nursing note reviewed.  Constitutional:      Appearance: She is well-developed.  HENT:     Head: Normocephalic and atraumatic.  Cardiovascular:     Rate and Rhythm: Normal rate and regular rhythm.       Heart sounds: Normal heart sounds. No murmur heard. No friction rub. No gallop.   Pulmonary:     Effort: Pulmonary effort is normal. No tachypnea or respiratory distress.     Breath sounds: Normal breath sounds. No decreased breath sounds, wheezing, rhonchi or rales.  Chest:     Chest wall: No tenderness.  Abdominal:     General: Bowel sounds are normal.     Palpations: Abdomen is soft.  Musculoskeletal:     Right hand: Normal.     Left hand: Tenderness (left middle finger) present. Decreased range of motion (left middle finger).     Cervical back: Normal range of motion.  Skin:    General: Skin is warm and dry.  Neurological:     Mental Status: She is alert and oriented to person, place, and time.     Coordination: Coordination normal.  Psychiatric:        Behavior: Behavior normal. Behavior is cooperative.        Thought Content: Thought content normal.        Judgment: Judgment normal.          Patient has been counseled extensively about nutrition and exercise as well as the importance of adherence with medications and regular follow-up. The patient was given clear instructions to go to ER or return to medical center if symptoms don't improve, worsen or new problems develop. The patient verbalized understanding.   Follow-up: Return for labs in 2 weeks. See Dr. Newlin at the end of July .   Zelda W Fleming, FNP-BC [facility] [place], [place] [phone]   07/16/2020, 12:26 PM 

## 2020-07-17 ENCOUNTER — Other Ambulatory Visit: Payer: Self-pay

## 2020-07-24 ENCOUNTER — Other Ambulatory Visit: Payer: Self-pay

## 2020-07-24 ENCOUNTER — Ambulatory Visit (INDEPENDENT_AMBULATORY_CARE_PROVIDER_SITE_OTHER): Payer: Self-pay | Admitting: Family Medicine

## 2020-07-24 ENCOUNTER — Encounter: Payer: Self-pay | Admitting: Family Medicine

## 2020-07-24 DIAGNOSIS — M65332 Trigger finger, left middle finger: Secondary | ICD-10-CM

## 2020-07-24 DIAGNOSIS — R200 Anesthesia of skin: Secondary | ICD-10-CM

## 2020-07-24 DIAGNOSIS — R202 Paresthesia of skin: Secondary | ICD-10-CM

## 2020-07-24 MED FILL — Insulin NPH Isophane & Regular Human Inj 100 Unit/ML (70-30): SUBCUTANEOUS | 28 days supply | Qty: 10 | Fill #0 | Status: AC

## 2020-07-24 NOTE — Progress Notes (Signed)
Office Visit Note   Patient: Rachael Russo           Date of Birth: March 19, 1976           MRN: [id number] Visit Date: 07/24/2020 Requested by: Claiborne Rigg, NP [address] [place],  [place] [address] PCP: Hoy Register, MD  Subjective: Chief Complaint  Patient presents with  . Other    Trigger fingers both hands: middle finger on the left x 5 months and ring finger on the right x 1 year. Right hand dominant.    HPI: She is here with right hand fourth finger numbness and left third finger triggering.  Interpreter was present today.  Symptoms have been present for several months, no injury.  She has diabetes and thyroid disease, and does a lot of work with her hands at home.  She has tried a wrist brace on the right at night with no relief of numbness.  She has tried prednisone for both hands but that does not seem to be helping either.  She has never had these problems before.                ROS:   All other systems were reviewed and are negative.  Objective: Vital Signs: There were no vitals taken for this visit.  Physical Exam:  General:  Alert and oriented, in no acute distress. Pulm:  Breathing unlabored. Psy:  Normal mood, congruent affect. Skin: No rash Right hand: She has positive Tinel's at the carpal tunnel, negative Phalen's test.  No thenar atrophy.  Good intrinsic strength.  No triggering of the fourth finger, no tenderness at the A1 pulley. Left hand: She has a tender nodule at the third finger A1 pulley with triggering in flexion.    Imaging: No results found.  Assessment & Plan: 1.  Right fourth finger numbness, suspicious for carpal tunnel syndrome. -We discussed various options and she wants to try an injection.  If this does not help, then nerve conduction studies.  2.  Left third trigger finger -We discussed options and she wants an injection.  If no relief, then either hand therapy or surgical consult.     Procedures: Right wrist  carpal tunnel injection: After sterile prep with Betadine, injected 1 cc 0.25% bupivacaine and 20 mg Depo-Medrol into the carpal tunnel. Left third trigger finger injection: After sterile prep with Betadine, injected 1 cc 0.25% bupivacaine and 20 mg Depo-Medrol into the region of the A1 pulley.       PMFS History: Patient Active Problem List   Diagnosis Date Noted  . Hyperlipidemia 11/24/2017  . Vaginal discharge 09/26/2016  . Uncontrolled type 2 diabetes mellitus without complication, with long-term current use of insulin 07/26/2015  . Finger numbness 12/07/2014  . Poor dentition 10/27/2014  . Graves disease 06/12/2014  . S/P tubal ligation 06/12/2014  . Language barrier, speaks Spanish only and requires interpreter 05/09/2013   Past Medical History:  Diagnosis Date  . AMA (advanced maternal age) multigravida 35+ 05/09/2013   Offered genetic screening, discussed options, patient desires quad screen Clinic [facility]  Dating LMP consistent with 10 week scan  Genetic Screen  Quad:                  NIPS:  Anatomic [place]   GTT Type II DM  TDaP vaccine   Flu vaccine   GBS   Baby Food Breast  Contraception Condoms  Pediatrician [name]  Support Person Mother      .  AMA (advanced maternal age) multigravida 35+ 05/09/2013   Offered genetic screening, discussed options, patient desires quad screen Clinic [facility]  Dating LMP consistent with 10 week scan  Genetic Screen  Quad normal  Anatomic [place] Limited, will reevaluate on repeat scans  GTT Type II DM  TDaP vaccine   Flu vaccine   GBS   Baby Food Breast  Contraception Condoms  Pediatrician [name]  Support Person Mother      . Diabetes mellitus   . LGA (large for gestational age) fetus affecting mother, antepartum 11/17/2013   11/16/2013 EFW 3747gm >90%tile     Family History  Problem Relation Age of Onset  . Diabetes Mother     Past Surgical History:  Procedure Laterality Date  . CESAREAN SECTION    . CESAREAN SECTION N/A 12/03/2013    Procedure: CESAREAN SECTION;  Surgeon: Allie Bossier, MD;  Location: [facility];  Service: Obstetrics;  Laterality: N/A;   Social History   Occupational History  . Not on file  Tobacco Use  . Smoking status: Never Smoker  . Smokeless tobacco: Never Used  Substance and Sexual Activity  . Alcohol use: No  . Drug use: No  . Sexual activity: Yes    Birth control/protection: Condom, Surgical

## 2020-07-30 ENCOUNTER — Ambulatory Visit: Payer: Self-pay | Attending: Family Medicine

## 2020-07-30 ENCOUNTER — Other Ambulatory Visit: Payer: Self-pay

## 2020-07-30 DIAGNOSIS — E1141 Type 2 diabetes mellitus with diabetic mononeuropathy: Secondary | ICD-10-CM

## 2020-07-30 DIAGNOSIS — E0500 Thyrotoxicosis with diffuse goiter without thyrotoxic crisis or storm: Secondary | ICD-10-CM

## 2020-07-30 DIAGNOSIS — Z794 Long term (current) use of insulin: Secondary | ICD-10-CM

## 2020-07-31 LAB — HEMOGLOBIN A1C
Est. average glucose Bld gHb Est-mCnc: 209 mg/dL
Hgb A1c MFr Bld: 8.9 % — ABNORMAL HIGH (ref 4.8–5.6)

## 2020-07-31 LAB — TSH: TSH: 5.5 u[IU]/mL — ABNORMAL HIGH (ref 0.450–4.500)

## 2020-07-31 LAB — T4, FREE: Free T4: 1.1 ng/dL (ref 0.82–1.77)

## 2020-08-02 ENCOUNTER — Other Ambulatory Visit: Payer: Self-pay | Admitting: Nurse Practitioner

## 2020-08-02 MED ORDER — INSULIN NPH ISOPHANE & REGULAR (70-30) 100 UNIT/ML ~~LOC~~ SUSP
20.0000 [IU] | Freq: Two times a day (BID) | SUBCUTANEOUS | 1 refills | Status: DC
  Filled 2020-08-02: qty 36, 90d supply, fill #0
  Filled 2020-08-20: qty 10, 25d supply, fill #0
  Filled 2020-08-20: qty 36, 90d supply, fill #0
  Filled 2020-09-24: qty 10, 25d supply, fill #1
  Filled 2020-11-12: qty 10, 25d supply, fill #2
  Filled 2021-01-03: qty 10, 25d supply, fill #3
  Filled 2021-01-28: qty 10, 25d supply, fill #4

## 2020-08-03 ENCOUNTER — Other Ambulatory Visit: Payer: Self-pay

## 2020-08-10 ENCOUNTER — Other Ambulatory Visit: Payer: Self-pay

## 2020-08-20 ENCOUNTER — Other Ambulatory Visit: Payer: Self-pay

## 2020-09-12 ENCOUNTER — Other Ambulatory Visit: Payer: Self-pay

## 2020-09-24 ENCOUNTER — Other Ambulatory Visit: Payer: Self-pay

## 2020-10-12 ENCOUNTER — Other Ambulatory Visit: Payer: Self-pay

## 2020-11-05 ENCOUNTER — Ambulatory Visit: Payer: Self-pay | Attending: Family Medicine | Admitting: Family Medicine

## 2020-11-05 ENCOUNTER — Encounter: Payer: Self-pay | Admitting: Family Medicine

## 2020-11-05 ENCOUNTER — Other Ambulatory Visit: Payer: Self-pay

## 2020-11-05 VITALS — BP 114/69 | HR 75 | Ht 63.0 in | Wt 150.0 lb

## 2020-11-05 DIAGNOSIS — E0500 Thyrotoxicosis with diffuse goiter without thyrotoxic crisis or storm: Secondary | ICD-10-CM

## 2020-11-05 DIAGNOSIS — E1141 Type 2 diabetes mellitus with diabetic mononeuropathy: Secondary | ICD-10-CM

## 2020-11-05 DIAGNOSIS — E7800 Pure hypercholesterolemia, unspecified: Secondary | ICD-10-CM

## 2020-11-05 DIAGNOSIS — Z794 Long term (current) use of insulin: Secondary | ICD-10-CM

## 2020-11-05 LAB — POCT GLYCOSYLATED HEMOGLOBIN (HGB A1C): HbA1c, POC (controlled diabetic range): 7.5 % — AB (ref 0.0–7.0)

## 2020-11-05 LAB — GLUCOSE, POCT (MANUAL RESULT ENTRY): POC Glucose: 142 mg/dl — AB (ref 70–99)

## 2020-11-05 MED ORDER — DAPAGLIFLOZIN PROPANEDIOL 5 MG PO TABS
5.0000 mg | ORAL_TABLET | Freq: Every day | ORAL | 6 refills | Status: DC
  Filled 2020-11-05: qty 30, 30d supply, fill #0
  Filled 2020-11-30: qty 30, 30d supply, fill #1
  Filled 2021-01-03: qty 30, 30d supply, fill #2
  Filled 2021-01-28: qty 30, 30d supply, fill #3
  Filled 2021-02-26: qty 30, 30d supply, fill #4
  Filled 2021-04-11: qty 30, 30d supply, fill #5
  Filled 2021-05-08: qty 30, 30d supply, fill #0
  Filled 2021-05-08: qty 30, 30d supply, fill #6

## 2020-11-05 NOTE — Patient Instructions (Signed)
Dapagliflozin tablets Qu es este medicamento? La DAPAGLIFLOZINA controla los niveles de azcar en la sangre en personas con diabetes. Se [place] con cambios en el estilo de vida tales como dieta y ejercicio fsico. Tambin se [place] para tratar la insuficiencia cardiaca y la enfermedad renal. Puede disminuir el riesgo de tratamiento para la insuficiencia cardiacaen un hospital o empeoramiento de la enfermedad renal. Este medicamento puede ser utilizado para otros usos; si tiene Designer, industrial/product preguntaconsulte con su proveedor de atencin mdica o con su farmacutico. MARCAS COMUNES: Particia Nearing le debo informar a mi profesional de la salud antes de tomar estemedicamento? Necesitan saber si usted presenta alguno de los siguientes problemas osituaciones: deshidratacin cetoacidosis diabtica dieta baja en sal comer menos debido a una enfermedad, ciruga, dieta o cualquier otro motivo realizacin de cirugas antecedentes de pancreatitis o problemas del pncreas antecedentes de infeccin por cndida en el pene o la vagina si bebe alcohol con frecuencia infeccin en la vejiga, los riones o el tracto urinario enfermedad renal presin sangunea baja est en dilisis problemas para orinar diabetes tipo 1 varn sin Nurse, adult o inusual a la dapagliflozina, a otros medicamentos, alimentos, colorantes o conservantes si est embarazada obuscando quedar embarazada si est amamantando a un beb Cmo debo utilizar este medicamento? Tome este medicamento por va oral con agua. selo segn las instrucciones en la etiqueta a la [employer]. Puede tomarlo con o sin alimentos. Si el Social worker, tmelo con alimentos. Siga usndolo a menos que su proveedor de Adult nurse. Su farmacutico le dar una Gua del medicamento especial (MedGuide, nombre en ingls) con cada receta y en cada ocasin que la vuelva a surtir. Asegrese deleer esta  informacin cada vez cuidadosamente. Hable con su proveedor de atencin [employer] uso de este medicamento ennios. Puede requerir atencin especial. Sobredosis: Pngase en contacto inmediatamente con un centro toxicolgico o unasala de urgencia si usted cree que haya tomado demasiado medicamento. ATENCIN: [employer] es solo para usted. No comparta este medicamento connadie. Qu sucede si me olvido de una dosis? Si olvida una dosis, adminstrela lo antes posible. Si es casi la hora de la prxima dosis, administre solo esa dosis. No se administre dosis adicionales odobles. Qu puede interactuar con este medicamento? No se anticipan interacciones. Puede ser que esta lista no menciona todas las posibles interacciones. Informe a su profesional de [employer] de [employer] Inc productos a base de hierbas, medicamentos de [place] o suplementos nutritivos que est tomando. Si usted fuma, consume bebidas alcohlicas o si utiliza drogas ilegales, indqueselo tambin a su profesional de [employer]. Algunas sustancias pueden interactuar consu medicamento. A qu debo estar atento al usar PPL Corporation? Visite a su proveedor de atencin mdica para que revise regularmente su evolucin. Informe a su proveedor de atencin mdica si sus sntomas nocomienzan a mejorar o si empeoran. Este medicamento puede causar una afeccin grave en la que hay demasiado cido en la sangre. Si tiene nuseas, vmitos, dolor de estmago, cansancio inusual o problemas respiratorios, deje de tomar [employer] y llame a su mdico de inmediato. Si es posible, use una tira reactiva de cetonas para determinar sitiene cetonas en la orina. Consulte con su proveedor de atencin mdica si tiene diarrea grave, nuseas y vmitos, o sudoracin intensa. La prdida de demasiado lquido Administrator que sea peligroso usar [employer]. Se monitorizar una prueba llamada Hemoglobina A1C (A1C). Es un anlisis de sangre sencillo.  Mide su control del nivel de azcar en la sangre durante losltimos 2 a 3 meses. Se le realizar esta prueba cada 3 a 6 meses. Aprenda a revisar su nivel de azcar en la sangre. Conozca los sntomas delnivel bajo y alto de International aid/development worker en la sangre y cmo controlarlos. Siempre lleve con usted una fuente rpida de azcar por si tiene sntomas de nivel bajo de azcar [employer]. Algunos ejemplos incluyen caramelos duros de azcar o tabletas de glucosa. Asegrese de que otras personas sepan que usted se puede ahogar si come o bebe cuando presenta sntomas graves de [place] bajo de azcar en la sangre, como convulsiones o prdida del conocimiento. Debe obtenerayuda mdica de inmediato. Informe a su proveedor de atencin mdica si tiene niveles altos de Banker. Es posible que deba cambiar la dosis de su medicamento. Si est enfermo o hace ms ejercicio que lo habitual, es posible que necesite cambiarla dosis de su medicamento. No saltee comidas. Pregunte a su proveedor de atencin mdica si debe evitar el alcohol. Muchos productos de venta libre para la tos y Warehouse manager o alcohol. Estos pueden afectar los niveles de Banker. Use un brazalete o una cadena de identificacin mdica. Lleve consigo una tarjeta que describa su afeccin. Incluya en la tarjeta una lista de losmedicamentos y las dosis que [place]. Qu efectos secundarios puedo tener al [employer] este medicamento? Efectos secundarios que debe informar a su mdico o a su profesional de lasalud tan pronto como sea posible: Therapist, art (erupcin cutnea, comezn/picazn o urticaria, hinchazn de la cara, los labios o la lengua) problemas para respirar mareos sensacin de desmayo o aturdimiento, cadas infeccin genital (fiebre; sensibilidad, enrojecimiento o hinchazn en los genitales o en el rea desde los genitales hasta la parte de atrs del recto) lesin renal (dificultad para orinar o cambios en la cantidad de  orina) niveles bajos de International aid/development worker en la sangre (ansiedad; confusin; mareos; aumento del apetito; debilidad o cansancio inusuales; aumento de la sudoracin; temblores; piel fra y sudorosa; irritabilidad; Engineer, mining de cabeza; visin borrosa; frecuencia cardiaca rpida; prdida del conocimiento) debilidad muscular nuseas, vmitos, dolor o Programme researcher, broadcasting/film/video inusual nuevo dolor o sensibilidad, cambio en el color de la piel, llagas o lceras, o infeccin en las piernas o los pies secreciones, comezn/picazn o dolor en el pene cansancio inusual flujo vaginal inusual, comezn/picazn u olor infeccin de las vas urinarias (fiebre; escalofros; sensacin de ardor al Geographical information systems officer; necesidad urgente de orinar ms seguido; sangreen la orina; dolor de espalda) Efectos secundarios que generalmente no requieren Psychologist, prison and probation services (infrmelos asu mdico o a Producer, television/film/video de la salud si persisten o si son molestos): aumento leve en la frecuencia urinaria sed Puede ser que esta lista no menciona todos los posibles efectos secundarios. Comunquese a su mdico por asesoramiento mdico [employer]lett[employer]ard. Usted puede informar los efectos secundarios a la FDA por telfono al1-800-FDA-1088. Dnde debo guardar mi medicina? Mantenga fuera del alcance de nios y Neurosurgeon. Guarde a San[employer]SCI, entre 20 y 25 grados Celsius (68 y 53 grados Fahrenheit). Deseche todo el medicamento que no haya utilizado despus de lafecha de vencimiento. Para desechar el medicamento que ya no necesite o que est vencido: Lleve el medicamento a un programa de recuperacin de medicamentos. Consulte con su farmacia o con una entidad reguladora para encontrar un lugar donde llevarlo. Si no puede devolver el medicamento, consulte la etiqueta o el prospecto para ver si debe desecharlo en la basura o arrojarlo  por el sanitario. Si no est seguro, consulte con su proveedor de [employer] Sciences Corporation. Si es seguro colocarlo en la basura, saque el medicamento  del recipiente. Mezcle el medicamento con piedras sanitarias para gatos, tierra, posos (residuos) de caf u otro desperdicio. Coloque la [employer] bolsa o recipiente que [place]. Deseche en la basura. ATENCIN: Este folleto es un resumen. Puede ser que no cubra toda la posible informacin. Si usted tiene preguntas acerca de esta medicina, consulte con sumdico, su farmacutico o su profesional de Radiographer, therapeutic.  2022 Elsevier/Gold Standard (2020-05-03 00:00:00)

## 2020-11-05 NOTE — Progress Notes (Signed)
Subjective:  Patient ID: Rachael Russo, female    DOB: 07-Apr-1976  Age: 45 y.o. MRN: [id number]  CC: Diabetes   HPI Rachael Russo is a 44 year old female with a history of type 2 diabetes mellitus (A1c 7.5), hypertension, Graves' disease who presents today for chronic disease management.  Interval History: She endorses compliance with her diabetic medications with no complaints of hypoglycemia.  She has no visual concerns, numbness in extremities. Doing well on her statin. She is tolerant of Tapazole for her Graves' disease and has been compliant with her antihypertensive. She has no additional concerns today.  Past Medical History:  Diagnosis Date   AMA (advanced maternal age) multigravida 35+ 05/09/2013   Offered genetic screening, discussed options, patient desires quad screen [facility]  Dating LMP consistent with 10 week scan  Genetic Screen  Quad:                  NIPS:  Anatomic [place]   GTT Type II DM  TDaP vaccine   Flu vaccine   GBS   Baby Food Breast  Contraception Condoms  Pediatrician [name]  Support Person Mother       AMA (advanced maternal age) multigravida 35+ 05/09/2013   Offered genetic screening, discussed options, patient desires quad screen [facility]  Dating LMP consistent with 10 week scan  Genetic Screen  Quad normal  Anatomic [place], will reevaluate on repeat scans  GTT Type II DM  TDaP vaccine   Flu vaccine   GBS   Baby Food Breast  Contraception Condoms  Pediatrician [name]  Support Person Mother       Diabetes mellitus    LGA (large for gestational age) fetus affecting mother, antepartum 11/17/2013   11/16/2013 EFW 3747gm >90%tile     Past Surgical History:  Procedure Laterality Date   CESAREAN SECTION     CESAREAN SECTION N/A 12/03/2013   Procedure: CESAREAN SECTION;  Surgeon: [name], MD;  Location: [facility];  Service: Obstetrics;  Laterality: N/A;    Family History  Problem Relation Age of Onset   Diabetes  Mother     No Known Allergies  Outpatient Medications Prior to Visit  Medication Sig Dispense Refill   atorvastatin (LIPITOR) 10 MG tablet Take 1 tablet (10 mg total) by mouth daily. 30 tablet 6   Blood Glucose Monitoring Suppl (TRUE METRIX METER) w/Device KIT 1 each by Does not apply route 3 (three) times daily. 1 kit 0   gabapentin (NEURONTIN) 300 MG capsule Take 1 capsule (300 mg total) by mouth at bedtime. 90 capsule 1   glucose blood (TRUE METRIX BLOOD GLUCOSE TEST) test strip 1 each by Other route 3 (three) times daily. 100 each 3   ibuprofen (ADVIL) 600 MG tablet Take 1 tablet (600 mg total) by mouth every 8 (eight) hours as needed. 60 tablet 3   lisinopril (ZESTRIL) 2.5 MG tablet Take 1 tablet (2.5 mg total) by mouth daily. 90 tablet 1   metFORMIN (GLUCOPHAGE-XR) 500 MG 24 hr tablet Take 2 tablets (1,000 mg total) by mouth 2 (two) times daily. 120 tablet 6   methimazole (TAPAZOLE) 5 MG tablet Take 1 tablet (5 mg total) by mouth 2 (two) times daily. 60 tablet 6   metoprolol succinate (TOPROL-XL) 25 MG 24 hr tablet Take 1 tablet (25 mg total) by mouth daily. 30 tablet 6   Prenatal Vit-Fe Fumarate-FA (PRENATAL MULTIVITAMIN) TABS tablet Take 1 tablet by mouth daily. [place]  tablet 2   TRUEplus Lancets 28G MISC 1 each by Does not apply route 2 (two) times daily. 100 each 6   insulin NPH-regular Human (70-30) 100 UNIT/ML injection Inject 20 Units into the skin 2 (two) times daily with a meal. 36 mL 1   No facility-administered medications prior to visit.     ROS Review of Systems  Constitutional:  Negative for activity change, appetite change and fatigue.  HENT:  Negative for congestion, sinus pressure and sore throat.   Eyes:  Negative for visual disturbance.  Respiratory:  Negative for cough, chest tightness, shortness of breath and wheezing.   Cardiovascular:  Negative for chest pain and palpitations.  Gastrointestinal:  Negative for abdominal distention, abdominal pain and  constipation.  Endocrine: Negative for polydipsia.  Genitourinary:  Negative for dysuria and frequency.  Musculoskeletal:  Negative for arthralgias and back pain.  Skin:  Negative for rash.  Neurological:  Negative for tremors, light-headedness and numbness.  Hematological:  Does not bruise/bleed easily.  Psychiatric/Behavioral:  Negative for agitation and behavioral problems.    Objective:  BP 114/69   Pulse 75   Ht 5' 3"  (1.6 m)   Wt 150 lb (68 kg)   SpO2 100%   BMI 26.57 kg/m   BP/Weight 11/05/2020 07/16/2020 24/07/6284  Systolic BP 381 771 165  Diastolic BP 69 77 69  Wt. (Lbs) 150 150 152  BMI 26.57 27.44 26.93      Physical Exam Constitutional:      Appearance: She is well-developed.  Cardiovascular:     Rate and Rhythm: Normal rate.     Heart sounds: Normal heart sounds. No murmur heard. Pulmonary:     Effort: Pulmonary effort is normal.     Breath sounds: Normal breath sounds. No wheezing or rales.  Chest:     Chest wall: No tenderness.  Abdominal:     General: Bowel sounds are normal. There is no distension.     Palpations: Abdomen is soft. There is no mass.     Tenderness: There is no abdominal tenderness.  Musculoskeletal:        General: Normal range of motion.     Right lower leg: No edema.     Left lower leg: No edema.  Neurological:     Mental Status: She is alert and oriented to person, place, and time.  Psychiatric:        Mood and Affect: Mood normal.    CMP Latest Ref Rng & Units 04/26/2020 09/21/2019 02/15/2019  Glucose 65 - 99 mg/dL 135(H) 195(H) 135(H)  BUN 6 - 24 mg/dL 10 9 9   Creatinine 0.57 - 1.00 mg/dL 0.74 0.70 0.67  Sodium 134 - 144 mmol/L 142 143 139  Potassium 3.5 - 5.2 mmol/L 4.7 4.1 4.7  Chloride 96 - 106 mmol/L 103 100 103  CO2 20 - 29 mmol/L 21 21 22   Calcium 8.7 - 10.2 mg/dL 9.9 9.6 9.7  Total Protein 6.0 - 8.5 g/dL 7.4 7.7 7.1  Total Bilirubin 0.0 - 1.2 mg/dL 0.3 0.3 0.3  Alkaline Phos 44 - 121 IU/L 104 120 93  AST 0 - 40 IU/L  16 14 12   ALT 0 - 32 IU/L 17 17 11     Lipid Panel     Component Value Date/Time   CHOL 165 04/26/2020 1002   TRIG 44 04/26/2020 1002   HDL 69 04/26/2020 1002   CHOLHDL 2.4 04/26/2020 1002   CHOLHDL 3.7 02/01/2016 1703   VLDL 21 02/01/2016 1703  LDLCALC 87 04/26/2020 1002    CBC    Component Value Date/Time   WBC 9.2 06/12/2014 1035   RBC 4.53 06/12/2014 1035   HGB 13.4 06/12/2014 1035   HCT 38.2 06/12/2014 1035   PLT 277 06/12/2014 1035   MCV 84.3 06/12/2014 1035   MCH 29.6 06/12/2014 1035   MCHC 35.1 06/12/2014 1035   RDW 12.2 06/12/2014 1035   LYMPHSABS 2.5 04/27/2013 0840   MONOABS 0.6 04/27/2013 0840   EOSABS 0.0 04/27/2013 0840   BASOSABS 0.0 04/27/2013 0840    Lab Results  Component Value Date   HGBA1C 7.5 (A) 11/05/2020    Lab Results  Component Value Date   TSH 5.500 (H) 07/30/2020    Assessment & Plan:  1. 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 Advised on the need for annual eye exams, annual foot exams, Pneumonia vaccine. - POCT glucose (manual entry) - POCT glycosylated hemoglobin (Hb A1C) - dapagliflozin propanediol (FARXIGA) 5 MG TABS tablet; Take 1 tablet (5 mg total) by mouth daily before breakfast.  Dispense: 30 tablet; Refill: 6  2. Graves disease Last TSH was slightly elevated but prior to that was normal Continue current dose and will recheck thyroid level at next visit  3. Pure hypercholesterolemia Controlled Low-cholesterol diet    Meds ordered this encounter  Medications   dapagliflozin propanediol  (FARXIGA) 5 MG TABS tablet    Sig: Take 1 tablet (5 mg total) by mouth daily before breakfast.    Dispense:  30 tablet    Refill:  6    Follow-up: Return in about 3 months (around 02/05/2021) for Medical condition.       Charlott Rakes, MD, FAAFP. [facility], [place]   11/05/2020, 5:13 PM

## 2020-11-12 ENCOUNTER — Other Ambulatory Visit: Payer: Self-pay

## 2020-11-12 ENCOUNTER — Other Ambulatory Visit: Payer: Self-pay | Admitting: Family Medicine

## 2020-11-12 DIAGNOSIS — Z794 Long term (current) use of insulin: Secondary | ICD-10-CM

## 2020-11-12 DIAGNOSIS — E1141 Type 2 diabetes mellitus with diabetic mononeuropathy: Secondary | ICD-10-CM

## 2020-11-12 MED ORDER — LISINOPRIL 2.5 MG PO TABS
2.5000 mg | ORAL_TABLET | Freq: Every day | ORAL | 0 refills | Status: DC
  Filled 2020-11-12: qty 30, 30d supply, fill #0
  Filled 2020-12-18: qty 30, 30d supply, fill #1
  Filled 2021-01-14: qty 30, 30d supply, fill #2

## 2020-11-12 NOTE — Telephone Encounter (Signed)
Requested Prescriptions  Pending Prescriptions Disp Refills  . lisinopril (ZESTRIL) 2.5 MG tablet 90 tablet 0    Sig: Take 1 tablet (2.5 mg total) by mouth daily.     Cardiovascular:  ACE Inhibitors Failed - 11/12/2020  8:56 AM      Failed - Cr in normal range and within 180 days    Creat  Date Value Ref Range Status  02/01/2016 0.75 0.50 - 1.10 mg/dL Final   Creatinine, Ser  Date Value Ref Range Status  04/26/2020 0.74 0.57 - 1.00 mg/dL Final   Creatinine, POC  Date Value Ref Range Status  12/29/2016 50 mg/dL Final   Creatinine, Urine  Date Value Ref Range Status  10/30/2015 41 20 - 320 mg/dL Final         Failed - K in normal range and within 180 days    Potassium  Date Value Ref Range Status  04/26/2020 4.7 3.5 - 5.2 mmol/L Final         Passed - Patient is not pregnant      Passed - Last BP in normal range    BP Readings from Last 1 Encounters:  11/05/20 114/69         Passed - Valid encounter within last 6 months    Recent Outpatient Visits          1 week ago 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, with long-term current use of insulin (HCC)   [facility] [place], Odette Horns, MD   3 months ago Trigger middle finger of left hand   [facility] [place], Shea Stakes, NP   6 months ago Trigger middle finger of left hand   [facility] [place], Odette Horns, MD   8 months ago Screening for cervical cancer   [facility] [place], [place], NP   9 months ago Annual physical exam   [facility] Hoy Register, MD      Future Appointments            In 2 months Hoy Register, MD [facility]

## 2020-11-30 ENCOUNTER — Other Ambulatory Visit: Payer: Self-pay

## 2020-12-12 ENCOUNTER — Other Ambulatory Visit: Payer: Self-pay | Admitting: Family Medicine

## 2020-12-12 DIAGNOSIS — Z1231 Encounter for screening mammogram for malignant neoplasm of breast: Secondary | ICD-10-CM

## 2020-12-18 ENCOUNTER — Other Ambulatory Visit: Payer: Self-pay

## 2020-12-18 ENCOUNTER — Other Ambulatory Visit: Payer: Self-pay | Admitting: Family Medicine

## 2020-12-18 DIAGNOSIS — Z1231 Encounter for screening mammogram for malignant neoplasm of breast: Secondary | ICD-10-CM

## 2021-01-03 ENCOUNTER — Other Ambulatory Visit: Payer: Self-pay

## 2021-01-12 LAB — FECAL OCCULT BLOOD, IMMUNOCHEMICAL: IFOBT: NEGATIVE

## 2021-01-15 ENCOUNTER — Other Ambulatory Visit: Payer: Self-pay

## 2021-01-15 ENCOUNTER — Ambulatory Visit
Admission: RE | Admit: 2021-01-15 | Discharge: 2021-01-15 | Disposition: A | Discharge status: Home / Self Care | Payer: No Typology Code available for payment source | Source: Ambulatory Visit | Attending: Family Medicine | Admitting: Family Medicine

## 2021-01-15 DIAGNOSIS — Z1231 Encounter for screening mammogram for malignant neoplasm of breast: Secondary | ICD-10-CM

## 2021-01-28 ENCOUNTER — Other Ambulatory Visit: Payer: Self-pay

## 2021-02-05 ENCOUNTER — Ambulatory Visit: Payer: Self-pay | Attending: Family Medicine | Admitting: Family Medicine

## 2021-02-05 ENCOUNTER — Encounter: Payer: Self-pay | Admitting: Family Medicine

## 2021-02-05 ENCOUNTER — Other Ambulatory Visit: Payer: Self-pay

## 2021-02-05 VITALS — BP 117/77 | HR 69 | Ht 63.0 in | Wt 144.0 lb

## 2021-02-05 DIAGNOSIS — E7800 Pure hypercholesterolemia, unspecified: Secondary | ICD-10-CM

## 2021-02-05 DIAGNOSIS — E0500 Thyrotoxicosis with diffuse goiter without thyrotoxic crisis or storm: Secondary | ICD-10-CM

## 2021-02-05 DIAGNOSIS — E1141 Type 2 diabetes mellitus with diabetic mononeuropathy: Secondary | ICD-10-CM

## 2021-02-05 DIAGNOSIS — Z23 Encounter for immunization: Secondary | ICD-10-CM

## 2021-02-05 DIAGNOSIS — Z794 Long term (current) use of insulin: Secondary | ICD-10-CM

## 2021-02-05 DIAGNOSIS — Z1211 Encounter for screening for malignant neoplasm of colon: Secondary | ICD-10-CM

## 2021-02-05 LAB — POCT GLYCOSYLATED HEMOGLOBIN (HGB A1C): HbA1c, POC (controlled diabetic range): 7.1 % — AB (ref 0.0–7.0)

## 2021-02-05 LAB — GLUCOSE, POCT (MANUAL RESULT ENTRY): POC Glucose: 143 mg/dl — AB (ref 70–99)

## 2021-02-05 MED ORDER — ATORVASTATIN CALCIUM 10 MG PO TABS
10.0000 mg | ORAL_TABLET | Freq: Every day | ORAL | 6 refills | Status: DC
  Filled 2021-02-05 – 2021-02-15 (×2): qty 30, 30d supply, fill #0
  Filled 2021-03-13: qty 30, 30d supply, fill #1
  Filled 2021-04-11: qty 30, 30d supply, fill #2
  Filled 2021-05-08: qty 30, 30d supply, fill #0
  Filled 2021-05-08: qty 30, 30d supply, fill #3
  Filled 2021-05-08: qty 30, 30d supply, fill #0
  Filled 2021-08-01: qty 30, 30d supply, fill #1
  Filled 2021-09-02: qty 30, 30d supply, fill #2
  Filled 2021-10-01: qty 30, 30d supply, fill #3

## 2021-02-05 MED ORDER — GABAPENTIN 300 MG PO CAPS
300.0000 mg | ORAL_CAPSULE | Freq: Every day | ORAL | 1 refills | Status: DC
  Filled 2021-02-05: qty 90, 90d supply, fill #0
  Filled 2021-02-26: qty 30, 30d supply, fill #0
  Filled 2021-04-11: qty 30, 30d supply, fill #1
  Filled 2021-08-01: qty 30, 30d supply, fill #0
  Filled 2021-09-02: qty 30, 30d supply, fill #1
  Filled 2021-10-01: qty 30, 30d supply, fill #2
  Filled 2021-11-01: qty 30, 30d supply, fill #3

## 2021-02-05 MED ORDER — METOPROLOL SUCCINATE ER 25 MG PO TB24
25.0000 mg | ORAL_TABLET | Freq: Every day | ORAL | 6 refills | Status: DC
  Filled 2021-02-05 – 2021-02-15 (×2): qty 30, 30d supply, fill #0
  Filled 2021-03-13: qty 30, 30d supply, fill #1
  Filled 2021-04-11: qty 30, 30d supply, fill #2
  Filled 2021-05-08: qty 30, 30d supply, fill #3
  Filled 2021-05-08: qty 30, 30d supply, fill #0
  Filled 2021-06-10: qty 30, 30d supply, fill #1
  Filled 2021-08-01: qty 30, 30d supply, fill #2
  Filled 2021-09-02: qty 30, 30d supply, fill #3

## 2021-02-05 MED ORDER — INSULIN NPH ISOPHANE & REGULAR (70-30) 100 UNIT/ML ~~LOC~~ SUSP
18.0000 [IU] | Freq: Two times a day (BID) | SUBCUTANEOUS | 6 refills | Status: DC
  Filled 2021-02-05: qty 36, 100d supply, fill #0
  Filled 2021-02-26: qty 10, 28d supply, fill #0
  Filled 2021-05-08: qty 10, 28d supply, fill #1
  Filled 2021-05-08: qty 10, 28d supply, fill #0
  Filled 2021-08-01: qty 10, 28d supply, fill #1
  Filled 2021-09-02: qty 10, 28d supply, fill #2
  Filled 2021-10-01: qty 10, 28d supply, fill #3
  Filled 2021-12-04: qty 10, 28d supply, fill #4

## 2021-02-05 MED ORDER — LISINOPRIL 2.5 MG PO TABS
2.5000 mg | ORAL_TABLET | Freq: Every day | ORAL | 1 refills | Status: DC
  Filled 2021-02-05: qty 90, 90d supply, fill #0
  Filled 2021-02-15: qty 30, 30d supply, fill #0
  Filled 2021-03-13: qty 30, 30d supply, fill #1
  Filled 2021-04-11: qty 30, 30d supply, fill #2
  Filled 2021-05-08: qty 30, 30d supply, fill #0
  Filled 2021-05-08: qty 30, 30d supply, fill #3
  Filled 2021-06-10: qty 30, 30d supply, fill #1
  Filled 2021-08-01: qty 30, 30d supply, fill #2

## 2021-02-05 MED ORDER — METFORMIN HCL ER 500 MG PO TB24
1000.0000 mg | ORAL_TABLET | Freq: Two times a day (BID) | ORAL | 6 refills | Status: DC
  Filled 2021-02-05 – 2021-02-15 (×2): qty 120, 30d supply, fill #0
  Filled 2021-03-13: qty 120, 30d supply, fill #1
  Filled 2021-04-11: qty 120, 30d supply, fill #2
  Filled 2021-05-08: qty 120, 30d supply, fill #3
  Filled 2021-05-08: qty 120, 30d supply, fill #0
  Filled 2021-06-10: qty 120, 30d supply, fill #1
  Filled 2021-08-01: qty 120, 30d supply, fill #2
  Filled 2021-09-02: qty 120, 30d supply, fill #3

## 2021-02-05 NOTE — Patient Instructions (Signed)
La diabetes mellitus y el cuidado de los pies ?Diabetes Mellitus and Foot Care ?El cuidado de los pies es un aspecto importante de la salud, especialmente si tiene diabetes. La diabetes puede generar problemas debido a que el flujo sangu?neo (circulaci?n) es deficiente en las piernas y los pies, y esto puede hacer que la piel: ?Se torne m?s fina y seca. ?Se resquebraje m?s f?cilmente. ?Cicatrice m?s lentamente. ?Se descame y agriete. ?Tambi?n pueden estar da?ados los nervios (neuropat?a) de las piernas y de los pies, lo que provoca una disminuci?n de la sensibilidad. En consecuencia, es posible que no advierta heridas peque?as en los pies que pueden causar problemas m?s graves. Identificar y tratar cualquier complicaci?n lo antes posible es la mejor manera de evitar futuros problemas de pie. ?C?mo cuidar los pies ?Higiene de los pies ? ?L?vese los pies todos los d?as con agua tibia y un jab?n suave. No use agua caliente. Luego s?quese los pies y entre los dedos dando palmaditas, hasta que est?n completamente secos. No remoje los pies, ya que esto puede resecar la piel. ?C?rtese las u?as de los pies en l?nea recta. No escarbe debajo de las u?as o alrededor de las cut?culas. Lime los bordes de las u?as con una lima o esmeril. ?Aplique una loci?n hidratante o vaselina en la piel de los pies y en las u?as secas y quebradizas. Use una loci?n que no contenga alcohol ni fragancias. No aplique loci?n entre los dedos. ?Zapatos y calcetines ?Use calcetines de algod?n o medias limpias todos los d?as. Aseg?rese de que no le ajusten demasiado. No use calcetines que le lleguen a las rodillas, ya que podr?an disminuir el flujo de sangre a las piernas. ?Use zapatos de cuero que le queden bien y que sean acolchados. Revise siempre los zapatos antes de ponerlos para asegurarse de que no haya objetos en su interior. ?Para amoldar los zapatos, c?lcelos solo algunas horas por d?a. Esto evitar? lesiones en los pies. ?Heridas, rasgu?os,  durezas y callosidades ? ?Controle sus pies diariamente para observar si hay ampollas, cortes, moretones, llagas o enrojecimiento. Si no puede ver la planta del pie, use un espejo o p?dale ayuda a otra persona. ?No corte las durezas o callosidades, ni trate de quitarlas con medicamentos. ?Si algo le ha raspado, cortado o lastimado la piel de los pies, mantenga la piel de esa zona limpia y seca. Puede higienizar estas zonas con agua y un jab?n suave. No limpie la zona con agua oxigenada, alcohol ni yodo. ?Si tiene una herida, un rasgu?o, una dureza o una callosidad en el pie, rev?sela varias veces al d?a para asegurarse de que se est? curando y no se infecte. Est? atento a los siguientes signos: ?Enrojecimiento, hinchaz?n o dolor. ?L?quido o sangre. ?Calor. ?Pus o mal olor. ?Consejos generales ?No se cruce de piernas. Esto puede disminuir el flujo de sangre a los pies. ?No use bolsas de agua caliente ni almohadillas t?rmicas en los pies. Podr?an causar quemaduras. Si ha perdido la sensibilidad en los pies o las piernas, no sabr? lo que le est? sucediendo hasta que sea demasiado tarde. ?Proteja sus pies del calor y del fr?o con calzado, en la playa o sobre el pavimento caliente. ?Programe una cita para un examen completo de los pies por lo menos una vez al a?o (anualmente) o con m?s frecuencia si tiene problemas en los pies. Informe todos los cortes, llagas o moretones a su m?dico inmediatamente. ?D?nde buscar m?s informaci?n ?American Diabetes Association (Asociaci?n Estadounidense de la Diabetes): www.diabetes.org ?Association   of Diabetes Care & Education Specialists (Asociaci?n de Especialistas en Atenci?n y Educaci?n sobre la Diabetes): www.diabeteseducator.org ?Comun?quese con un m?dico si: ?Tiene una afecci?n que aumenta su riesgo de tener infecciones y tiene cortes, llagas o moretones en los pies. ?Tiene una lesi?n que no se cura. ?Tiene una zona irritada en las piernas o los pies. ?Siente una sensaci?n de  ardor u hormigueo en las piernas o los pies. ?Siente dolor o calambres en las piernas o los pies. ?Las piernas o los pies est?n adormecidos. ?Siente los pies siempre fr?os. ?Siente dolor alrededor de alguna u?a del pie. ?Solicite ayuda de inmediato si: ?Tiene una herida, un rasgu?o, una dureza o una callosidad en el pie y: ?Tiene dolor, hinchaz?n o enrojecimiento que empeora. ?Le sale l?quido o sangre de la herida, el rasgu?o, la dureza o la callosidad. ?La herida, el rasgu?o, la dureza o la callosidad est? caliente al tacto. ?Le sale pus o mal olor de la herida, el rasgu?o, la dureza o la callosidad. ?Tiene fiebre. ?Tiene una l?nea roja que sube por la pierna. ?Resumen ?Examine a diario sus pies en busca de cortes, moretones, enrojecimiento, ampollas o llagas. ?Aplique una loci?n hidratante o vaselina en la piel de los pies y en las u?as secas y quebradizas. ?Use zapatos de cuero que le queden bien y que sean acolchados. ?Si tiene problemas en los pies, inf?rmele al m?dico de inmediato sobre los cortes, las llagas o los moretones. ?Programe una cita para un examen completo de los pies por lo menos una vez al a?o (anualmente) o con m?s frecuencia si tiene problemas en los pies. ?Esta informaci?n no tiene como fin reemplazar el consejo del m?dico. Aseg?rese de hacerle al m?dico cualquier pregunta que tenga. ?Document Revised: 12/07/2019 Document Reviewed: 12/07/2019 ?Elsevier Patient Education ? 2022 Elsevier Inc. ? ?

## 2021-02-05 NOTE — Progress Notes (Signed)
Subjective:  Patient ID: Rachael Russo, female    DOB: [birth year]  Age: 45 y.o. MRN: [id number]  CC: Diabetes   HPI Rachael Russo is a 45 y.o. year old female with a history of type 2 diabetes mellitus (A1c 7.1), hypertension, Graves' Russo who presents today for chronic Russo management.  Interval History: A1c is 7.1 down from 7.5 previously Blood sugars have been in the low 100s with no complaints of hypoglycemia.  She does state that she reduces her insulin from 20 units to 18 units when her blood sugars are low.  Her neuropathy is controlled on gabapentin.  Also on a statin She is not up to date on annual eye exams.  With regards to her Rachael Russo her last TSH from 07/2020 was elevated. Her hypertension is controlled on her current regimen. Denies additional concerns today.  Past Medical History:  Diagnosis Date   AMA (advanced maternal age) multigravida 35+ 05/09/2013   Offered genetic screening, discussed options, patient desires [facility] screen [facility]  Dating LMP consistent with 10 week scan  Genetic Screen  Quad:                  NIPS:  Anatomic [place]   GTT Type II DM  TDaP vaccine   Flu vaccine   GBS   Baby Food Breast  Contraception Condoms  Pediatrician [name]  Support Person Mother       AMA (advanced maternal age) multigravida 35+ 05/09/2013   Offered genetic screening, discussed options, patient desires [facility] screen [facility]  Dating LMP consistent with 10 week scan  Genetic Screen  Quad normal  Anatomic [place] Limited, will reevaluate on repeat scans  GTT Type II DM  TDaP vaccine   Flu vaccine   GBS   Baby Food Breast  Contraception Condoms  Pediatrician [name]  Support Person Mother       Diabetes mellitus    LGA (large for gestational age) fetus affecting mother, antepartum 11/17/2013   11/16/2013 EFW 3747gm >90%tile     Past Surgical History:  Procedure Laterality Date   CESAREAN SECTION     CESAREAN SECTION N/A 12/03/2013    Procedure: CESAREAN SECTION;  Surgeon: [name], MD;  Location: [facility];  Service: Obstetrics;  Laterality: N/A;    Family History  Problem Relation Age of Onset   Diabetes Mother    Breast cancer Neg Hx     No Known Allergies  Outpatient Medications Prior to Visit  Medication Sig Dispense Refill   Blood Glucose Monitoring Suppl (TRUE METRIX METER) w/Device KIT 1 each by Does not apply route 3 (three) times daily. 1 kit 0   dapagliflozin propanediol (FARXIGA) 5 MG TABS tablet Take 1 tablet (5 mg total) by mouth daily before breakfast. 30 tablet 6   glucose blood (TRUE METRIX BLOOD GLUCOSE TEST) test strip 1 each by Other route 3 (three) times daily. 100 each 3   ibuprofen (ADVIL) 600 MG tablet Take 1 tablet (600 mg total) by mouth every 8 (eight) hours as needed. 60 tablet 3   methimazole (TAPAZOLE) 5 MG tablet Take 1 tablet (5 mg total) by mouth 2 (two) times daily. 60 tablet 6   Prenatal Vit-Fe Fumarate-FA (PRENATAL MULTIVITAMIN) TABS tablet Take 1 tablet by mouth daily. 30 tablet 2   TRUEplus Lancets 28G MISC 1 each by Does not apply route 2 (two) times daily. 100 each 6   atorvastatin (LIPITOR) 10 MG tablet Take  1 tablet (10 mg total) by mouth daily. 30 tablet 6   gabapentin (NEURONTIN) 300 MG capsule Take 1 capsule (300 mg total) by mouth at bedtime. 90 capsule 1   insulin NPH-regular Human (70-30) 100 UNIT/ML injection Inject 20 Units into the skin 2 (two) times daily with a meal. 36 mL 1   lisinopril (ZESTRIL) 2.5 MG tablet Take 1 tablet (2.5 mg total) by mouth daily. 90 tablet 0   metFORMIN (GLUCOPHAGE-XR) 500 MG 24 hr tablet Take 2 tablets (1,000 mg total) by mouth 2 (two) times daily. 120 tablet 6   metoprolol succinate (TOPROL-XL) 25 MG 24 hr tablet Take 1 tablet (25 mg total) by mouth daily. 30 tablet 6   No facility-administered medications prior to visit.     ROS Review of Systems  Constitutional:  Negative for activity change, appetite change and fatigue.  HENT:   Negative for congestion, sinus pressure and sore throat.   Eyes:  Negative for visual disturbance.  Respiratory:  Negative for cough, chest tightness, shortness of breath and wheezing.   Cardiovascular:  Negative for chest pain and palpitations.  Gastrointestinal:  Negative for abdominal distention, abdominal pain and constipation.  Endocrine: Negative for polydipsia.  Genitourinary:  Negative for dysuria and frequency.  Musculoskeletal:  Negative for arthralgias and back pain.  Skin:  Negative for rash.  Neurological:  Negative for tremors, light-headedness and numbness.  Hematological:  Does not bruise/bleed easily.  Psychiatric/Behavioral:  Negative for agitation and behavioral problems.    Objective:  BP 117/77   Pulse 69   Ht 5' 3"  (1.6 m)   Wt 144 lb (65.3 kg)   SpO2 99%   BMI 25.51 kg/m   BP/Weight 02/05/2021 9/41/7408 04/17/4816  Systolic BP 563 149 702  Diastolic BP 77 69 77  Wt. (Lbs) 144 150 150  BMI 25.51 26.57 27.44      Physical Exam Constitutional:      Appearance: She is well-developed.  Cardiovascular:     Rate and Rhythm: Normal rate.     Heart sounds: Normal heart sounds. No murmur heard. Pulmonary:     Effort: Pulmonary effort is normal.     Breath sounds: Normal breath sounds. No wheezing or rales.  Chest:     Chest wall: No tenderness.  Abdominal:     General: Bowel sounds are normal. There is no distension.     Palpations: Abdomen is soft. There is no mass.     Tenderness: There is no abdominal tenderness.  Musculoskeletal:        General: Normal range of motion.     Right lower leg: No edema.     Left lower leg: No edema.  Neurological:     Mental Status: She is alert and oriented to person, place, and time.  Psychiatric:        Mood and Affect: Mood normal.    CMP Latest Ref Rng & Units 04/26/2020 09/21/2019 02/15/2019  Glucose 65 - 99 mg/dL 135(H) 195(H) 135(H)  BUN 6 - 24 mg/dL 10 9 9   Creatinine 0.57 - 1.00 mg/dL 0.74 0.70 0.67  Sodium  134 - 144 mmol/L 142 143 139  Potassium 3.5 - 5.2 mmol/L 4.7 4.1 4.7  Chloride 96 - 106 mmol/L 103 100 103  CO2 20 - 29 mmol/L 21 21 22   Calcium 8.7 - 10.2 mg/dL 9.9 9.6 9.7  Total Protein 6.0 - 8.5 g/dL 7.4 7.7 7.1  Total Bilirubin 0.0 - 1.2 mg/dL 0.3 0.3 0.3  Alkaline Phos  44 - 121 IU/L 104 120 93  AST 0 - 40 IU/L 16 14 12   ALT 0 - 32 IU/L 17 17 11     Lipid Panel     Component Value Date/Time   CHOL 165 04/26/2020 1002   TRIG 44 04/26/2020 1002   HDL 69 04/26/2020 1002   CHOLHDL 2.4 04/26/2020 1002   CHOLHDL 3.7 02/01/2016 1703   VLDL 21 02/01/2016 1703   LDLCALC 87 04/26/2020 1002    CBC    Component Value Date/Time   WBC 9.2 06/12/2014 1035   RBC 4.53 06/12/2014 1035   HGB 13.4 06/12/2014 1035   HCT 38.2 06/12/2014 1035   PLT 277 06/12/2014 1035   MCV 84.3 06/12/2014 1035   MCH 29.6 06/12/2014 1035   MCHC 35.1 06/12/2014 1035   RDW 12.2 06/12/2014 1035   LYMPHSABS 2.5 04/27/2013 0840   MONOABS 0.6 04/27/2013 0840   EOSABS 0.0 04/27/2013 0840   BASOSABS 0.0 04/27/2013 0840    Lab Results  Component Value Date   HGBA1C 7.1 (A) 02/05/2021    Lab Results  Component Value Date   TSH 5.500 (H) 07/30/2020    Assessment & Plan:  1. 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 Advised on the need for annual eye exams, annual foot exams, Pneumonia vaccine. - POCT glucose (manual entry) - POCT glycosylated hemoglobin (Hb A1C) - LP+Non-HDL Cholesterol - CMP14+EGFR - gabapentin  (NEURONTIN) 300 MG capsule; Take 1 capsule (300 mg total) by mouth at bedtime.  Dispense: 90 capsule; Refill: 1 - lisinopril (ZESTRIL) 2.5 MG tablet; Take 1 tablet (2.5 mg total) by mouth daily.  Dispense: 90 tablet; Refill: 1 - metFORMIN (GLUCOPHAGE-XR) 500 MG 24 hr tablet; Take 2 tablets (1,000 mg total) by mouth 2 (two) times daily.  Dispense: 120 tablet; Refill: 6  2. Screening for colon cancer - Fecal occult blood, imunochemical([facility])  3. Graves Russo Last TSH was elevated in 07/2020 Check thyroid labs and adjust regimen accordingly - TSH - T4, free - metoprolol succinate (TOPROL-XL) 25 MG 24 hr tablet; Take 1 tablet (25 mg total) by mouth daily.  Dispense: 30 tablet; Refill: 6  4. Pure hypercholesterolemia Controlled Low-cholesterol diet - atorvastatin (LIPITOR) 10 MG tablet; Take 1 tablet (10 mg total) by mouth daily.  Dispense: 30 tablet; Refill: 6  5. Need for immunization against influenza - Flu Vaccine QUAD 4moIM (Fluarix, Fluzone & Alfiuria Quad PF)   Health Care Maintenance: counseled to check [place] she lacks medical coverage for referral to Ophthalmology.  Meds ordered this encounter  Medications   insulin NPH-regular Human (70-30) 100 UNIT/ML injection    Sig: Inject 18 Units into the skin 2 (two) times daily with a meal.    Dispense:  36 mL    Refill:  6   atorvastatin (LIPITOR) 10 MG tablet    Sig: Take 1 tablet (10 mg total) by mouth daily.    Dispense:  30 tablet    Refill:  6   gabapentin (  NEURONTIN) 300 MG capsule    Sig: Take 1 capsule (300 mg total) by mouth at bedtime.    Dispense:  90 capsule    Refill:  1   lisinopril (ZESTRIL) 2.5 MG tablet    Sig: Take 1 tablet (2.5 mg total) by mouth daily.    Dispense:  90 tablet    Refill:  1   metFORMIN (GLUCOPHAGE-XR) 500 MG 24 hr tablet    Sig: Take 2 tablets (1,000 mg total) by mouth 2 (two) times daily.    Dispense:  120 tablet    Refill:  6   metoprolol succinate  (TOPROL-XL) 25 MG 24 hr tablet    Sig: Take 1 tablet (25 mg total) by mouth daily.    Dispense:  30 tablet    Refill:  6     Follow-up: Return in about 6 months (around 08/06/2021) for Chronic medical condition.       Charlott Rakes, MD, FAAFP. [facility] [place], [place]   02/05/2021, 12:54 PM

## 2021-02-06 ENCOUNTER — Other Ambulatory Visit: Payer: Self-pay | Admitting: Family Medicine

## 2021-02-06 DIAGNOSIS — E0500 Thyrotoxicosis with diffuse goiter without thyrotoxic crisis or storm: Secondary | ICD-10-CM

## 2021-02-06 LAB — LP+NON-HDL CHOLESTEROL
Cholesterol, Total: 151 mg/dL (ref 100–199)
HDL: 64 mg/dL (ref >39)
LDL Chol Calc (NIH): 77 mg/dL (ref 0–99)
Total Non-HDL-Chol (LDL+VLDL): 87 mg/dL (ref 0–129)
Triglycerides: 46 mg/dL (ref 0–149)
VLDL Cholesterol Cal: 10 mg/dL (ref 5–40)

## 2021-02-06 LAB — CMP14+EGFR
ALT: 15 IU/L (ref 0–32)
AST: 14 IU/L (ref 0–40)
Albumin/Globulin Ratio: 2 (ref 1.2–2.2)
Albumin: 4.8 g/dL (ref 3.8–4.8)
Alkaline Phosphatase: 96 IU/L (ref 44–121)
BUN/Creatinine Ratio: 14 (ref 9–23)
BUN: 10 mg/dL (ref 6–24)
Bilirubin Total: 0.3 mg/dL (ref 0.0–1.2)
CO2: 22 mmol/L (ref 20–29)
Calcium: 9.5 mg/dL (ref 8.7–10.2)
Chloride: 104 mmol/L (ref 96–106)
Creatinine, Ser: 0.69 mg/dL (ref 0.57–1.00)
Globulin, Total: 2.4 g/dL (ref 1.5–4.5)
Glucose: 137 mg/dL — ABNORMAL HIGH (ref 70–99)
Potassium: 4.7 mmol/L (ref 3.5–5.2)
Sodium: 142 mmol/L (ref 134–144)
Total Protein: 7.2 g/dL (ref 6.0–8.5)
eGFR: 109 mL/min/{1.73_m2} (ref >59)

## 2021-02-06 LAB — T4, FREE: Free T4: 1.08 ng/dL (ref 0.82–1.77)

## 2021-02-06 LAB — TSH: TSH: 1.88 u[IU]/mL (ref 0.450–4.500)

## 2021-02-06 MED ORDER — METHIMAZOLE 5 MG PO TABS
5.0000 mg | ORAL_TABLET | Freq: Two times a day (BID) | ORAL | 6 refills | Status: DC
  Filled 2021-02-06 – 2021-02-15 (×2): qty 60, 30d supply, fill #0
  Filled 2021-03-13: qty 60, 30d supply, fill #1
  Filled 2021-04-11: qty 60, 30d supply, fill #2
  Filled 2021-05-08: qty 60, 30d supply, fill #3
  Filled 2021-05-08: qty 60, 30d supply, fill #0
  Filled 2021-06-10: qty 60, 30d supply, fill #1
  Filled 2021-08-01: qty 60, 30d supply, fill #2

## 2021-02-07 ENCOUNTER — Other Ambulatory Visit: Payer: Self-pay

## 2021-02-08 LAB — FECAL OCCULT BLOOD, IMMUNOCHEMICAL: Fecal Occult Bld: NEGATIVE

## 2021-02-14 ENCOUNTER — Other Ambulatory Visit: Payer: Self-pay

## 2021-02-14 ENCOUNTER — Telehealth: Payer: Self-pay

## 2021-02-14 NOTE — Telephone Encounter (Signed)
-----   Message from Hoy Register, MD sent at 02/08/2021  7:35 AM EDT ----- Please inform the patient that labs are normal. Thank you.

## 2021-02-14 NOTE — Telephone Encounter (Signed)
Patient name and DOB has been verified Patient was informed of lab results. Patient had no questions.  

## 2021-02-14 NOTE — Telephone Encounter (Signed)
-----   Message from Hoy Register, MD sent at 02/06/2021 11:12 PM EDT ----- Please inform her that all her labs are normal. Continue current dose of Methimazole

## 2021-02-15 ENCOUNTER — Other Ambulatory Visit: Payer: Self-pay

## 2021-02-26 ENCOUNTER — Other Ambulatory Visit: Payer: Self-pay

## 2021-03-13 ENCOUNTER — Other Ambulatory Visit: Payer: Self-pay | Admitting: Family Medicine

## 2021-03-13 ENCOUNTER — Other Ambulatory Visit: Payer: Self-pay

## 2021-03-13 DIAGNOSIS — G5601 Carpal tunnel syndrome, right upper limb: Secondary | ICD-10-CM

## 2021-03-14 ENCOUNTER — Other Ambulatory Visit: Payer: Self-pay

## 2021-03-14 MED ORDER — IBUPROFEN 600 MG PO TABS
600.0000 mg | ORAL_TABLET | Freq: Three times a day (TID) | ORAL | 2 refills | Status: DC | PRN
  Filled 2021-03-14: qty 60, 20d supply, fill #0

## 2021-03-14 NOTE — Telephone Encounter (Signed)
Requested medications are due for refill today.  yes  Requested medications are on the active medications list.  yes  Last refill. 09/20/2019  Future visit scheduled.   yes  Notes to clinic.  Failed protocol d/t expired labs.

## 2021-03-15 ENCOUNTER — Other Ambulatory Visit: Payer: Self-pay

## 2021-03-29 ENCOUNTER — Other Ambulatory Visit: Payer: Self-pay

## 2021-04-11 ENCOUNTER — Other Ambulatory Visit: Payer: Self-pay

## 2021-04-12 ENCOUNTER — Other Ambulatory Visit: Payer: Self-pay

## 2021-05-08 ENCOUNTER — Other Ambulatory Visit: Payer: Self-pay

## 2021-06-10 ENCOUNTER — Other Ambulatory Visit: Payer: Self-pay | Admitting: Family Medicine

## 2021-06-10 ENCOUNTER — Other Ambulatory Visit: Payer: Self-pay

## 2021-06-10 DIAGNOSIS — E1141 Type 2 diabetes mellitus with diabetic mononeuropathy: Secondary | ICD-10-CM

## 2021-06-10 DIAGNOSIS — Z794 Long term (current) use of insulin: Secondary | ICD-10-CM

## 2021-06-10 MED ORDER — DAPAGLIFLOZIN PROPANEDIOL 5 MG PO TABS
5.0000 mg | ORAL_TABLET | Freq: Every day | ORAL | 1 refills | Status: DC
  Filled 2021-06-10: qty 30, 30d supply, fill #0
  Filled 2021-08-01: qty 14, 14d supply, fill #1
  Filled 2021-08-01: qty 30, 30d supply, fill #1

## 2021-06-11 ENCOUNTER — Other Ambulatory Visit: Payer: Self-pay

## 2021-08-01 ENCOUNTER — Other Ambulatory Visit: Payer: Self-pay

## 2021-08-07 ENCOUNTER — Encounter: Payer: Self-pay | Admitting: Family Medicine

## 2021-08-07 ENCOUNTER — Other Ambulatory Visit: Payer: Self-pay

## 2021-08-07 ENCOUNTER — Ambulatory Visit: Payer: Self-pay | Attending: Family Medicine | Admitting: Family Medicine

## 2021-08-07 VITALS — BP 122/77 | HR 74 | Ht 63.0 in | Wt 144.6 lb

## 2021-08-07 DIAGNOSIS — E0500 Thyrotoxicosis with diffuse goiter without thyrotoxic crisis or storm: Secondary | ICD-10-CM

## 2021-08-07 DIAGNOSIS — E1141 Type 2 diabetes mellitus with diabetic mononeuropathy: Secondary | ICD-10-CM

## 2021-08-07 DIAGNOSIS — N898 Other specified noninflammatory disorders of vagina: Secondary | ICD-10-CM

## 2021-08-07 DIAGNOSIS — Z794 Long term (current) use of insulin: Secondary | ICD-10-CM

## 2021-08-07 DIAGNOSIS — E7800 Pure hypercholesterolemia, unspecified: Secondary | ICD-10-CM

## 2021-08-07 DIAGNOSIS — Z1211 Encounter for screening for malignant neoplasm of colon: Secondary | ICD-10-CM

## 2021-08-07 LAB — POCT GLYCOSYLATED HEMOGLOBIN (HGB A1C): HbA1c, POC (controlled diabetic range): 7.3 % — AB (ref 0.0–7.0)

## 2021-08-07 LAB — GLUCOSE, POCT (MANUAL RESULT ENTRY): POC Glucose: 143 mg/dl — AB (ref 70–99)

## 2021-08-07 MED ORDER — FLUCONAZOLE 150 MG PO TABS
150.0000 mg | ORAL_TABLET | Freq: Once | ORAL | 0 refills | Status: AC
  Filled 2021-08-07: qty 1, 1d supply, fill #0

## 2021-08-07 MED ORDER — DAPAGLIFLOZIN PROPANEDIOL 5 MG PO TABS
5.0000 mg | ORAL_TABLET | Freq: Every day | ORAL | 1 refills | Status: DC
  Filled 2021-08-07 – 2021-09-02 (×2): qty 90, 90d supply, fill #0

## 2021-08-07 MED ORDER — LISINOPRIL 2.5 MG PO TABS
2.5000 mg | ORAL_TABLET | Freq: Every day | ORAL | 1 refills | Status: DC
  Filled 2021-08-07: qty 90, 90d supply, fill #0
  Filled 2021-09-02: qty 30, 30d supply, fill #0
  Filled 2021-10-01: qty 30, 30d supply, fill #1
  Filled 2021-11-01: qty 30, 30d supply, fill #2
  Filled 2021-12-04: qty 30, 30d supply, fill #3
  Filled 2022-01-03: qty 30, 30d supply, fill #4
  Filled 2022-01-31 (×2): qty 30, 30d supply, fill #5

## 2021-08-07 NOTE — Progress Notes (Signed)
? ?Subjective:  ?Patient ID: Rachael Russo, female    DOB: 08/11/1975  Age: 46 y.o. MRN: [id number] ? ?CC: Diabetes ? ? ?HPI ?Rachael Russo is a 46 y.o. year old female with a history of type 2 diabetes mellitus (A1c 7.3), hypertension, Graves' disease who presents today for chronic disease management ? ?Interval History: ?She complains of 3 day h/o vaginal itching but no discharge, no urinary symptoms. OTC creams have been ineffective. ?She endorses presence of burning in her vagina during intercourse and vaginal itching is worse with intercourse as well. ? ?Blood sugars at home have been 101-155. She exercises regularly.  Not up-to-date on annual eye exams.  Neuropathy is controlled on gabapentin and she denies hypoglycemic adverse effects. ?Denies additional concerns. ?Past Medical History:  ?Diagnosis Date  ? AMA (advanced maternal age) multigravida 35+ 05/09/2013  ? Offered genetic screening, discussed options, patient desires quad screen Clinic [facility]  Dating LMP consistent with 10 week scan  Genetic Screen  Quad:                  NIPS:  Anatomic [place]   GTT Type II DM  TDaP vaccine   Flu vaccine   GBS   Baby Food Breast  Contraception Condoms  Pediatrician [facility]  Support Person Mother      ? AMA (advanced maternal age) multigravida 35+ 05/09/2013  ? Offered genetic screening, discussed options, patient desires quad screen Clinic [facility]  Dating LMP consistent with 10 week scan  Genetic Screen  Quad normal  Anatomic [place] Limited, will reevaluate on repeat scans  GTT Type II DM  TDaP vaccine   Flu vaccine   GBS   Baby Food Breast  Contraception Condoms  Pediatrician [facility]  Support Person Mother      ? Diabetes mellitus   ? LGA (large for gestational age) fetus affecting mother, antepartum 11/17/2013  ? 11/16/2013 EFW 3747gm >90%tile   ? ? ?Past Surgical History:  ?Procedure Laterality Date  ? CESAREAN SECTION    ? CESAREAN SECTION N/A 12/03/2013  ? Procedure: CESAREAN SECTION;   Surgeon: [name], MD;  Location: [facility];  Service: Obstetrics;  Laterality: N/A;  ? ? ?Family History  ?Problem Relation Age of Onset  ? Diabetes Mother   ? Breast cancer Neg Hx   ? ? ?Social History  ? ?Socioeconomic History  ? Marital status: Married  ?  Spouse name: Not on file  ? Number of children: Not on file  ? Years of education: Not on file  ? Highest education level: Not on file  ?Occupational History  ? Not on file  ?Tobacco Use  ? Smoking status: Never  ? Smokeless tobacco: Never  ?Substance and Sexual Activity  ? Alcohol use: No  ? Drug use: No  ? Sexual activity: Yes  ?  Birth control/protection: Condom, Surgical  ?Other Topics Concern  ? Not on file  ?Social History Narrative  ? Not on file  ? ?Social Determinants of Health  ? ?Financial Resource Strain: Not on file  ?Food Insecurity: Not on file  ?Transportation Needs: Not on file  ?Physical Activity: Not on file  ?Stress: Not on file  ?Social Connections: Not on file  ? ? ?No Known Allergies ? ?Outpatient Medications Prior to Visit  ?Medication Sig Dispense Refill  ? atorvastatin (LIPITOR) 10 MG tablet Take 1 tablet (10 mg total) by mouth daily. 30 tablet 6  ? Blood Glucose Monitoring Suppl (TRUE METRIX  METER) w/Device KIT 1 each by Does not apply route 3 (three) times daily. 1 kit 0  ? gabapentin (NEURONTIN) 300 MG capsule Take 1 capsule (300 mg total) by mouth at bedtime. 90 capsule 1  ? glucose blood (TRUE METRIX BLOOD GLUCOSE TEST) test strip 1 each by Other route 3 (three) times daily. 100 each 3  ? ibuprofen (ADVIL) 600 MG tablet Take 1 tablet (600 mg total) by mouth every 8 (eight) hours as needed. 60 tablet 2  ? insulin NPH-regular Human (70-30) 100 UNIT/ML injection Inject 18 Units into the skin 2 (two) times daily with a meal. 36 mL 6  ? metFORMIN (GLUCOPHAGE-XR) 500 MG 24 hr tablet Take 2 tablets (1,000 mg total) by mouth 2 (two) times daily. 120 tablet 6  ? methimazole (TAPAZOLE) 5 MG tablet Take 1 tablet (5 mg total) by mouth 2  (two) times daily. 60 tablet 6  ? metoprolol succinate (TOPROL-XL) 25 MG 24 hr tablet Take 1 tablet (25 mg total) by mouth daily. 30 tablet 6  ? Prenatal Vit-Fe Fumarate-FA (PRENATAL MULTIVITAMIN) TABS tablet Take 1 tablet by mouth daily. 30 tablet 2  ? TRUEplus Lancets 28G MISC 1 each by Does not apply route 2 (two) times daily. 100 each 6  ? dapagliflozin propanediol (FARXIGA) 5 MG TABS tablet Take 1 tablet (5 mg total) by mouth daily before breakfast. 30 tablet 1  ? lisinopril (ZESTRIL) 2.5 MG tablet Take 1 tablet (2.5 mg total) by mouth daily. 90 tablet 1  ? ?No facility-administered medications prior to visit.  ? ? ? ?ROS ?Review of Systems  ?Constitutional:  Negative for activity change, appetite change and fatigue.  ?HENT:  Negative for congestion, sinus pressure and sore throat.   ?Eyes:  Negative for visual disturbance.  ?Respiratory:  Negative for cough, chest tightness, shortness of breath and wheezing.   ?Cardiovascular:  Negative for chest pain and palpitations.  ?Gastrointestinal:  Negative for abdominal distention, abdominal pain and constipation.  ?Endocrine: Negative for polydipsia.  ?Genitourinary:  Negative for dysuria and frequency.  ?Musculoskeletal:  Negative for arthralgias and back pain.  ?Skin:  Negative for rash.  ?Neurological:  Negative for tremors, light-headedness and numbness.  ?Hematological:  Does not bruise/bleed easily.  ?Psychiatric/Behavioral:  Negative for agitation and behavioral problems.   ? ?Objective:  ?BP 122/77   Pulse 74   Ht _0  (1.6 m)   Wt 144 lb 9.6 oz (65.6 kg)   SpO2 100%   BMI 25.61 kg/m?  ? ? ?  08/07/2021  ?  9:01 AM 02/05/2021  ?  8:47 AM 11/05/2020  ?  9:47 AM  ?BP/Weight  ?Systolic BP 370 488 891  ?Diastolic BP 77 77 69  ?Wt. (Lbs) 144.6 144 150  ?BMI 25.61 kg/m2 25.51 kg/m2 26.57 kg/m2  ? ? ? ? ?Physical Exam ?Constitutional:   ?   Appearance: She is well-developed.  ?Cardiovascular:  ?   Rate and Rhythm: Normal rate.  ?   Heart sounds: Normal heart  sounds. No murmur heard. ?Pulmonary:  ?   Effort: Pulmonary effort is normal.  ?   Breath sounds: Normal breath sounds. No wheezing or rales.  ?Chest:  ?   Chest wall: No tenderness.  ?Abdominal:  ?   General: Bowel sounds are normal. There is no distension.  ?   Palpations: Abdomen is soft. There is no mass.  ?   Tenderness: There is no abdominal tenderness.  ?Musculoskeletal:     ?   General: Normal  range of motion.  ?   Right lower leg: No edema.  ?   Left lower leg: No edema.  ?Neurological:  ?   Mental Status: She is alert and oriented to person, place, and time.  ?Psychiatric:     ?   Mood and Affect: Mood normal.  ? ? ? ?  Latest Ref Rng & Units 02/05/2021  ?  9:22 AM 04/26/2020  ? 10:02 AM 09/21/2019  ?  8:51 AM  ?CMP  ?Glucose 70 - 99 mg/dL 137   135   195    ?BUN 6 - 24 mg/dL _0 ?Creatinine 0.57 - 1.00 mg/dL 0.69   0.74   0.70    ?Sodium 134 - 144 mmol/L 142   142   143    ?Potassium 3.5 - 5.2 mmol/L 4.7   4.7   4.1    ?Chloride 96 - 106 mmol/L 104   103   100    ?CO2 20 - 29 mmol/L _1 ?Calcium 8.7 - 10.2 mg/dL 9.5   9.9   9.6    ?Total Protein 6.0 - 8.5 g/dL 7.2   7.4   7.7    ?Total Bilirubin 0.0 - 1.2 mg/dL 0.3   0.3   0.3    ?Alkaline Phos 44 - 121 IU/L 96   104   120    ?AST 0 - 40 IU/L _2 ?ALT 0 - 32 IU/L _3 ? ? ?Lipid Panel  ?   ?Component Value Date/Time  ? CHOL 151 02/05/2021 0922  ? TRIG 46 02/05/2021 0922  ? HDL 64 02/05/2021 0922  ? CHOLHDL 2.4 04/26/2020 1002  ? CHOLHDL 3.7 02/01/2016 1703  ? VLDL 21 02/01/2016 1703  ? LDLCALC 77 02/05/2021 0922  ? ? ?CBC ?   ?Component Value Date/Time  ? WBC 9.2 06/12/2014 1035  ? RBC 4.53 06/12/2014 1035  ? HGB 13.4 06/12/2014 1035  ? HCT 38.2 06/12/2014 1035  ? PLT 277 06/12/2014 1035  ? MCV 84.3 06/12/2014 1035  ? MCH 29.6 06/12/2014 1035  ? MCHC 35.1 06/12/2014 1035  ? RDW 12.2 06/12/2014 1035  ? LYMPHSABS 2.5 04/27/2013 0840  ? MONOABS 0.6 04/27/2013 0840  ? EOSABS 0.0 04/27/2013 0840  ? BASOSABS 0.0  04/27/2013 0840  ? ? ?Lab Results  ?Component Value Date  ? HGBA1C 7.3 (A) 08/07/2021  ? ? ?Lab Results  ?Component Value Date  ? TSH 1.880 02/05/2021  ? ? ?Assessment & Plan:  ?1. Controlled type 2 diabetes mellitus with dia

## 2021-08-07 NOTE — Progress Notes (Signed)
Vaginal itching

## 2021-08-08 ENCOUNTER — Other Ambulatory Visit: Payer: Self-pay

## 2021-08-08 ENCOUNTER — Other Ambulatory Visit: Payer: Self-pay | Admitting: Family Medicine

## 2021-08-08 DIAGNOSIS — E0500 Thyrotoxicosis with diffuse goiter without thyrotoxic crisis or storm: Secondary | ICD-10-CM

## 2021-08-08 LAB — TSH: TSH: 2.17 u[IU]/mL (ref 0.450–4.500)

## 2021-08-08 LAB — T4, FREE: Free T4: 0.94 ng/dL (ref 0.82–1.77)

## 2021-08-08 LAB — HEMOGLOBIN A1C
Est. average glucose Bld gHb Est-mCnc: 163 mg/dL
Hgb A1c MFr Bld: 7.3 % — ABNORMAL HIGH (ref 4.8–5.6)

## 2021-08-08 MED ORDER — METHIMAZOLE 5 MG PO TABS
5.0000 mg | ORAL_TABLET | Freq: Two times a day (BID) | ORAL | 6 refills | Status: DC
  Filled 2021-08-08 – 2021-09-02 (×2): qty 60, 30d supply, fill #0
  Filled 2021-10-01: qty 60, 30d supply, fill #1
  Filled 2021-11-01: qty 60, 30d supply, fill #2
  Filled 2021-12-04: qty 60, 30d supply, fill #3
  Filled 2022-01-03: qty 60, 30d supply, fill #4
  Filled 2022-01-31: qty 60, 30d supply, fill #5

## 2021-08-19 ENCOUNTER — Other Ambulatory Visit: Payer: Self-pay

## 2021-09-02 ENCOUNTER — Other Ambulatory Visit: Payer: Self-pay

## 2021-09-03 ENCOUNTER — Other Ambulatory Visit: Payer: Self-pay

## 2021-09-11 ENCOUNTER — Other Ambulatory Visit: Payer: Self-pay

## 2021-10-01 ENCOUNTER — Other Ambulatory Visit: Payer: Self-pay | Admitting: Family Medicine

## 2021-10-01 ENCOUNTER — Other Ambulatory Visit: Payer: Self-pay

## 2021-10-01 DIAGNOSIS — Z794 Long term (current) use of insulin: Secondary | ICD-10-CM

## 2021-10-01 DIAGNOSIS — E0500 Thyrotoxicosis with diffuse goiter without thyrotoxic crisis or storm: Secondary | ICD-10-CM

## 2021-10-01 MED ORDER — METFORMIN HCL ER 500 MG PO TB24
1000.0000 mg | ORAL_TABLET | Freq: Two times a day (BID) | ORAL | 3 refills | Status: DC
  Filled 2021-10-01: qty 120, 30d supply, fill #0
  Filled 2021-11-01: qty 120, 30d supply, fill #1
  Filled 2021-12-04: qty 120, 30d supply, fill #2
  Filled 2022-01-03: qty 120, 30d supply, fill #3

## 2021-10-01 MED ORDER — METOPROLOL SUCCINATE ER 25 MG PO TB24
25.0000 mg | ORAL_TABLET | Freq: Every day | ORAL | 3 refills | Status: DC
  Filled 2021-10-01: qty 30, 30d supply, fill #0
  Filled 2021-11-01: qty 30, 30d supply, fill #1
  Filled 2021-12-04: qty 30, 30d supply, fill #2
  Filled 2022-01-03: qty 30, 30d supply, fill #3

## 2021-10-02 ENCOUNTER — Other Ambulatory Visit: Payer: Self-pay

## 2021-11-01 ENCOUNTER — Other Ambulatory Visit: Payer: Self-pay

## 2021-11-01 ENCOUNTER — Other Ambulatory Visit: Payer: Self-pay | Admitting: Family Medicine

## 2021-11-01 DIAGNOSIS — E7800 Pure hypercholesterolemia, unspecified: Secondary | ICD-10-CM

## 2021-11-01 MED ORDER — ATORVASTATIN CALCIUM 10 MG PO TABS
10.0000 mg | ORAL_TABLET | Freq: Every day | ORAL | 2 refills | Status: DC
  Filled 2021-11-01: qty 30, 30d supply, fill #0
  Filled 2021-12-04: qty 30, 30d supply, fill #1
  Filled 2022-01-03: qty 30, 30d supply, fill #2

## 2021-12-04 ENCOUNTER — Other Ambulatory Visit: Payer: Self-pay

## 2021-12-04 ENCOUNTER — Other Ambulatory Visit: Payer: Self-pay | Admitting: Family Medicine

## 2021-12-04 DIAGNOSIS — Z794 Long term (current) use of insulin: Secondary | ICD-10-CM

## 2021-12-04 MED ORDER — GABAPENTIN 300 MG PO CAPS
300.0000 mg | ORAL_CAPSULE | Freq: Every day | ORAL | 1 refills | Status: DC
  Filled 2021-12-04: qty 90, 90d supply, fill #0

## 2022-01-03 ENCOUNTER — Other Ambulatory Visit: Payer: Self-pay

## 2022-01-08 ENCOUNTER — Other Ambulatory Visit: Payer: Self-pay

## 2022-01-31 ENCOUNTER — Other Ambulatory Visit: Payer: Self-pay | Admitting: Family Medicine

## 2022-01-31 ENCOUNTER — Other Ambulatory Visit: Payer: Self-pay

## 2022-01-31 DIAGNOSIS — E0500 Thyrotoxicosis with diffuse goiter without thyrotoxic crisis or storm: Secondary | ICD-10-CM

## 2022-01-31 DIAGNOSIS — E7800 Pure hypercholesterolemia, unspecified: Secondary | ICD-10-CM

## 2022-01-31 DIAGNOSIS — Z794 Long term (current) use of insulin: Secondary | ICD-10-CM

## 2022-01-31 MED ORDER — METOPROLOL SUCCINATE ER 25 MG PO TB24
25.0000 mg | ORAL_TABLET | Freq: Every day | ORAL | 0 refills | Status: DC
  Filled 2022-01-31: qty 30, 30d supply, fill #0

## 2022-01-31 MED ORDER — METFORMIN HCL ER 500 MG PO TB24
1000.0000 mg | ORAL_TABLET | Freq: Two times a day (BID) | ORAL | 0 refills | Status: DC
  Filled 2022-01-31: qty 120, 30d supply, fill #0

## 2022-01-31 MED ORDER — ATORVASTATIN CALCIUM 10 MG PO TABS
10.0000 mg | ORAL_TABLET | Freq: Every day | ORAL | 0 refills | Status: DC
  Filled 2022-01-31: qty 30, 30d supply, fill #0

## 2022-02-06 ENCOUNTER — Ambulatory Visit: Payer: No Typology Code available for payment source | Admitting: Family Medicine

## 2022-02-13 ENCOUNTER — Encounter: Payer: Self-pay | Admitting: Physician Assistant

## 2022-02-13 ENCOUNTER — Ambulatory Visit: Payer: No Typology Code available for payment source | Attending: Family Medicine | Admitting: Physician Assistant

## 2022-02-13 ENCOUNTER — Other Ambulatory Visit: Payer: Self-pay

## 2022-02-13 VITALS — BP 117/74 | HR 76 | Ht 62.0 in | Wt 140.0 lb

## 2022-02-13 DIAGNOSIS — Z23 Encounter for immunization: Secondary | ICD-10-CM

## 2022-02-13 DIAGNOSIS — Z789 Other specified health status: Secondary | ICD-10-CM

## 2022-02-13 DIAGNOSIS — E1165 Type 2 diabetes mellitus with hyperglycemia: Secondary | ICD-10-CM

## 2022-02-13 DIAGNOSIS — E7800 Pure hypercholesterolemia, unspecified: Secondary | ICD-10-CM

## 2022-02-13 DIAGNOSIS — E0500 Thyrotoxicosis with diffuse goiter without thyrotoxic crisis or storm: Secondary | ICD-10-CM

## 2022-02-13 DIAGNOSIS — Z794 Long term (current) use of insulin: Secondary | ICD-10-CM

## 2022-02-13 DIAGNOSIS — Z603 Acculturation difficulty: Secondary | ICD-10-CM

## 2022-02-13 LAB — POCT GLYCOSYLATED HEMOGLOBIN (HGB A1C): HbA1c, POC (controlled diabetic range): 7.6 % — AB (ref 0.0–7.0)

## 2022-02-13 MED ORDER — LISINOPRIL 2.5 MG PO TABS
2.5000 mg | ORAL_TABLET | Freq: Every day | ORAL | 1 refills | Status: AC
  Filled 2022-02-13 – 2022-03-03 (×2): qty 90, 90d supply, fill #0

## 2022-02-13 MED ORDER — METHIMAZOLE 5 MG PO TABS
5.0000 mg | ORAL_TABLET | Freq: Two times a day (BID) | ORAL | 6 refills | Status: DC
  Filled 2022-02-13 – 2022-03-03 (×2): qty 60, 30d supply, fill #0
  Filled 2022-04-02 (×2): qty 60, 30d supply, fill #1
  Filled 2022-05-09: qty 60, 30d supply, fill #2

## 2022-02-13 MED ORDER — ATORVASTATIN CALCIUM 10 MG PO TABS
10.0000 mg | ORAL_TABLET | Freq: Every day | ORAL | 1 refills | Status: AC
  Filled 2022-02-13 – 2022-03-03 (×2): qty 90, 90d supply, fill #0
  Filled 2022-04-02 – 2022-05-28 (×2): qty 90, 90d supply, fill #1

## 2022-02-13 MED ORDER — METFORMIN HCL ER 500 MG PO TB24
1000.0000 mg | ORAL_TABLET | Freq: Two times a day (BID) | ORAL | 1 refills | Status: AC
  Filled 2022-02-13 – 2022-03-03 (×2): qty 360, 90d supply, fill #0
  Filled 2022-05-28: qty 360, 90d supply, fill #1

## 2022-02-13 MED ORDER — DAPAGLIFLOZIN PROPANEDIOL 5 MG PO TABS
5.0000 mg | ORAL_TABLET | Freq: Every day | ORAL | 1 refills | Status: DC
  Filled 2022-02-13: qty 90, 90d supply, fill #0

## 2022-02-13 MED ORDER — METOPROLOL SUCCINATE ER 25 MG PO TB24
25.0000 mg | ORAL_TABLET | Freq: Every day | ORAL | 1 refills | Status: AC
  Filled 2022-02-13 – 2022-03-03 (×2): qty 90, 90d supply, fill #0
  Filled 2022-04-02 – 2022-05-28 (×2): qty 90, 90d supply, fill #1

## 2022-02-13 MED ORDER — GABAPENTIN 300 MG PO CAPS
300.0000 mg | ORAL_CAPSULE | Freq: Every day | ORAL | 1 refills | Status: AC
  Filled 2022-02-13: qty 90, 90d supply, fill #0

## 2022-02-13 MED ORDER — INSULIN NPH ISOPHANE & REGULAR (70-30) 100 UNIT/ML ~~LOC~~ SUSP
19.0000 [IU] | Freq: Two times a day (BID) | SUBCUTANEOUS | 3 refills | Status: DC
  Filled 2022-02-13 – 2022-02-24 (×2): qty 10, 26d supply, fill #0
  Filled 2022-04-02 (×2): qty 10, 26d supply, fill #1

## 2022-02-13 NOTE — Progress Notes (Signed)
Patient ID: Rachael Russo, female   DOB: [birth year], 46 y.o.   MRN: [id number]   Rachael Russo, is a 46 y.o. female  [id number]  [id number]  DOB - 1975-06-10  Chief Complaint  Patient presents with   Diabetes   Medication Refill        Subjective:  Chief Complaint and HPI: Rachael Russo is a 46 y.o. female here today for med RF.  No new issues or concerns. Compliant with meds.  No polydipsia/polyuria/vision changes.     ROS:   Constitutional:  No f/c, No night sweats, No unexplained weight loss. EENT:  No vision changes, No blurry vision, No hearing changes. No mouth, throat, or ear problems.  Respiratory: No cough, No SOB Cardiac: No CP, no palpitations GI:  No abd pain, No N/V/D. GU: No Urinary s/sx Musculoskeletal: No joint pain Neuro: No headache, no dizziness, no motor weakness.  Skin: No rash Endocrine:  No polydipsia. No polyuria.  Psych: Denies SI/HI  ALLERGIES: No Known Allergies  PAST MEDICAL HISTORY: Past Medical History:  Diagnosis Date   AMA (advanced maternal age) multigravida 35+ 05/09/2013   Offered genetic screening, discussed options, patient desires quad screen Clinic [place]  Dating LMP consistent with 10 week scan  Genetic Screen  Quad:                  NIPS:  Anatomic [place]   GTT Type II DM  TDaP vaccine   Flu vaccine   GBS   Baby Food Breast  Contraception Condoms  Pediatrician [name]  Support Person Mother       AMA (advanced maternal age) multigravida 35+ 05/09/2013   Offered genetic screening, discussed options, patient desires quad screen Clinic [place]  Dating LMP consistent with 10 week scan  Genetic Screen  Quad normal  Anatomic [place] Limited, will reevaluate on repeat scans  GTT Type II DM  TDaP vaccine   Flu vaccine   GBS   Baby Food Breast  Contraception Condoms  Pediatrician [name]  Support Person Mother       Diabetes mellitus    LGA (large for gestational age) fetus affecting mother, antepartum  11/17/2013   11/16/2013 EFW 3747gm >90%tile     MEDICATIONS AT HOME: Prior to Admission medications   Medication Sig Start Date End Date Taking? Authorizing Provider  Blood Glucose Monitoring Suppl (TRUE METRIX METER) w/Device KIT 1 each by Does not apply route 3 (three) times daily. 12/22/19  Yes Newlin, Enobong, MD  glucose blood (TRUE METRIX BLOOD GLUCOSE TEST) test strip 1 each by Other route 3 (three) times daily. 12/22/19  Yes Charlott Rakes, MD  ibuprofen (ADVIL) 600 MG tablet Take 1 tablet (600 mg total) by mouth every 8 (eight) hours as needed. 03/14/21  Yes Charlott Rakes, MD  Prenatal Vit-Fe Fumarate-FA (PRENATAL MULTIVITAMIN) TABS tablet Take 1 tablet by mouth daily. 06/12/14  Yes Funches, Josalyn, MD  TRUEplus Lancets 28G MISC 1 each by Does not apply route 2 (two) times daily. 12/22/19  Yes Charlott Rakes, MD  atorvastatin (LIPITOR) 10 MG tablet Take 1 tablet (10 mg total) by mouth daily. 02/13/22   Argentina Donovan, PA-C  dapagliflozin propanediol (FARXIGA) 5 MG TABS tablet Take 1 tablet (5 mg total) by mouth daily before breakfast. 02/13/22   Argentina Donovan, PA-C  gabapentin (NEURONTIN) 300 MG capsule Take 1 capsule (300 mg total) by mouth at bedtime. 02/13/22   Argentina Donovan, PA-C  insulin NPH-regular Human (  70-30) 100 UNIT/ML injection Inject 19 Units into the skin 2 (two) times daily with a meal. 02/13/22 05/14/22  Kiondre Grenz, Dionne Bucy, PA-C  lisinopril (ZESTRIL) 2.5 MG tablet Take 1 tablet (2.5 mg total) by mouth daily. 02/13/22   Argentina Donovan, PA-C  metFORMIN (GLUCOPHAGE-XR) 500 MG 24 hr tablet Take 2 tablets (1,000 mg total) by mouth 2 (two) times daily. 02/13/22   Argentina Donovan, PA-C  methimazole (TAPAZOLE) 5 MG tablet Take 1 tablet (5 mg total) by mouth 2 (two) times daily. 02/13/22   Argentina Donovan, PA-C  metoprolol succinate (TOPROL-XL) 25 MG 24 hr tablet Take 1 tablet (25 mg total) by mouth daily. 02/13/22   Argentina Donovan, PA-C     Objective:  EXAM:   Vitals:    02/13/22 0931  BP: 117/74  Pulse: 76  SpO2: 99%  Weight: 140 lb (63.5 kg)  Height: _0  (1.575 m)    General appearance : A&OX3. NAD. Non-toxic-appearing HEENT: Atraumatic and Normocephalic.  PERRLA. EOM intact.  TM clear B. Mouth-MMM, post pharynx WNL w/o erythema, No PND. Neck: supple, no JVD. No cervical lymphadenopathy. No thyromegaly Chest/Lungs:  Breathing-non-labored, Good air entry bilaterally, breath sounds normal without rales, rhonchi, or wheezing  CVS: S1 S2 regular, no murmurs, gallops, rubs  Extremities: B/L Lower Ext shows no edema, both legs are warm to touch with = pulse throughout Neurology:  CN II-XII grossly intact, Non focal.   Psych:  TP linear. J/I WNL. Normal speech. Appropriate eye contact and affect.  Skin:  No Rash    Assessment & Plan   1. Controlled type 2 diabetes mellitus with hyperglycemia, unspecified whether long term insulin use (HCC) Uncontrolled-increased dose from 17 units to 19 units bid and encouraged diabetic diet - HgB A1c - metFORMIN (GLUCOPHAGE-XR) 500 MG 24 hr tablet; Take 2 tablets (1,000 mg total) by mouth 2 (two) times daily.  Dispense: 360 tablet; Refill: 1 - lisinopril (ZESTRIL) 2.5 MG tablet; Take 1 tablet (2.5 mg total) by mouth daily.  Dispense: 90 tablet; Refill: 1 - dapagliflozin propanediol (FARXIGA) 5 MG TABS tablet; Take 1 tablet (5 mg total) by mouth daily before breakfast.  Dispense: 90 tablet; Refill: 1 - gabapentin (NEURONTIN) 300 MG capsule; Take 1 capsule (300 mg total) by mouth at bedtime.  Dispense: 90 capsule; Refill: 1 - insulin NPH-regular Human (70-30) 100 UNIT/ML injection; Inject 19 Units into the skin 2 (two) times daily with a meal.  Dispense: 30 mL; Refill: 3 - Comprehensive metabolic panel - Lipid panel  2. Pure hypercholesterolemia - atorvastatin (LIPITOR) 10 MG tablet; Take 1 tablet (10 mg total) by mouth daily.  Dispense: 90 tablet; Refill: 1 - Comprehensive metabolic panel  3. Graves disease -  metoprolol succinate (TOPROL-XL) 25 MG 24 hr tablet; Take 1 tablet (25 mg total) by mouth daily.  Dispense: 90 tablet; Refill: 1 - methimazole (TAPAZOLE) 5 MG tablet; Take 1 tablet (5 mg total) by mouth 2 (two) times daily.  Dispense: 60 tablet; Refill: 6 - Thyroid Panel With TSH  4. Language barrier AMN interpreters "Monica" used and additional time performing visit was required.  5. Need for immunization against influenza - Flu Vaccine QUAD 38moIM (Fluarix, Fluzone & Alfiuria Quad PF)    Patient have been counseled extensively about nutrition and exercise  Return in about 3 months (around 05/16/2022) for PCP for chronic conditions.  The patient was given clear instructions to go to ER or return to medical center if symptoms don't improve, worsen  or new problems develop. The patient verbalized understanding. The patient was told to call to get lab results if they haven't heard anything in the next week.     Freeman Caldron, PA-C [facility] and [facility], [place]   02/13/2022, 9:47 AM

## 2022-02-14 LAB — COMPREHENSIVE METABOLIC PANEL WITH GFR
ALT: 16 IU/L (ref 0–32)
AST: 15 IU/L (ref 0–40)
Albumin/Globulin Ratio: 2.2 (ref 1.2–2.2)
Albumin: 4.9 g/dL (ref 3.9–4.9)
Alkaline Phosphatase: 101 IU/L (ref 44–121)
BUN/Creatinine Ratio: 19 (ref 9–23)
BUN: 13 mg/dL (ref 6–24)
Bilirubin Total: 0.2 mg/dL (ref 0.0–1.2)
CO2: 22 mmol/L (ref 20–29)
Calcium: 9.6 mg/dL (ref 8.7–10.2)
Chloride: 103 mmol/L (ref 96–106)
Creatinine, Ser: 0.67 mg/dL (ref 0.57–1.00)
Globulin, Total: 2.2 g/dL (ref 1.5–4.5)
Glucose: 129 mg/dL — ABNORMAL HIGH (ref 70–99)
Potassium: 4.9 mmol/L (ref 3.5–5.2)
Sodium: 140 mmol/L (ref 134–144)
Total Protein: 7.1 g/dL (ref 6.0–8.5)
eGFR: 109 mL/min/1.73

## 2022-02-14 LAB — LIPID PANEL
Chol/HDL Ratio: 2.4 ratio (ref 0.0–4.4)
Cholesterol, Total: 159 mg/dL (ref 100–199)
HDL: 65 mg/dL
LDL Chol Calc (NIH): 84 mg/dL (ref 0–99)
Triglycerides: 45 mg/dL (ref 0–149)
VLDL Cholesterol Cal: 10 mg/dL (ref 5–40)

## 2022-02-14 LAB — THYROID PANEL WITH TSH
Free Thyroxine Index: 2.5 (ref 1.2–4.9)
T3 Uptake Ratio: 27 % (ref 24–39)
T4, Total: 9.3 ug/dL (ref 4.5–12.0)
TSH: 0.409 u[IU]/mL — ABNORMAL LOW (ref 0.450–4.500)

## 2022-02-17 ENCOUNTER — Telehealth: Payer: Self-pay | Admitting: *Deleted

## 2022-02-17 NOTE — Telephone Encounter (Signed)
Noted  

## 2022-02-17 NOTE — Telephone Encounter (Signed)
Patient called for lab results and informed: Thyroid is stable on current doses.  Blood count is normal.  Liver, kidney, and electrolyte levels are normal. Cholesterol is good.  Continue current medications and work on low sugar, lower carbohydrate diet.

## 2022-02-21 ENCOUNTER — Other Ambulatory Visit: Payer: Self-pay

## 2022-02-24 ENCOUNTER — Other Ambulatory Visit: Payer: Self-pay

## 2022-03-03 ENCOUNTER — Other Ambulatory Visit: Payer: Self-pay | Admitting: Family Medicine

## 2022-03-03 ENCOUNTER — Other Ambulatory Visit: Payer: Self-pay

## 2022-03-03 DIAGNOSIS — G5601 Carpal tunnel syndrome, right upper limb: Secondary | ICD-10-CM

## 2022-03-03 MED ORDER — IBUPROFEN 600 MG PO TABS
600.0000 mg | ORAL_TABLET | Freq: Three times a day (TID) | ORAL | 2 refills | Status: AC | PRN
  Filled 2022-03-03 – 2022-05-28 (×2): qty 60, 20d supply, fill #0

## 2022-03-07 ENCOUNTER — Other Ambulatory Visit: Payer: Self-pay

## 2022-03-10 ENCOUNTER — Other Ambulatory Visit: Payer: Self-pay

## 2022-04-02 ENCOUNTER — Other Ambulatory Visit: Payer: Self-pay

## 2022-04-16 ENCOUNTER — Other Ambulatory Visit: Payer: Self-pay

## 2022-04-21 ENCOUNTER — Other Ambulatory Visit: Payer: Self-pay

## 2022-05-09 ENCOUNTER — Other Ambulatory Visit: Payer: Self-pay

## 2022-05-21 ENCOUNTER — Encounter: Payer: Self-pay | Admitting: Family Medicine

## 2022-05-21 ENCOUNTER — Ambulatory Visit: Payer: Self-pay | Attending: Family Medicine | Admitting: Family Medicine

## 2022-05-21 ENCOUNTER — Other Ambulatory Visit: Payer: Self-pay

## 2022-05-21 VITALS — BP 139/78 | HR 80 | Temp 98.4°F | Wt 143.0 lb

## 2022-05-21 DIAGNOSIS — Z794 Long term (current) use of insulin: Secondary | ICD-10-CM

## 2022-05-21 DIAGNOSIS — E0500 Thyrotoxicosis with diffuse goiter without thyrotoxic crisis or storm: Secondary | ICD-10-CM

## 2022-05-21 DIAGNOSIS — Z1211 Encounter for screening for malignant neoplasm of colon: Secondary | ICD-10-CM

## 2022-05-21 DIAGNOSIS — B3731 Acute candidiasis of vulva and vagina: Secondary | ICD-10-CM

## 2022-05-21 DIAGNOSIS — E1169 Type 2 diabetes mellitus with other specified complication: Secondary | ICD-10-CM

## 2022-05-21 DIAGNOSIS — E1165 Type 2 diabetes mellitus with hyperglycemia: Secondary | ICD-10-CM

## 2022-05-21 LAB — GLUCOSE, POCT (MANUAL RESULT ENTRY): POC Glucose: 171 mg/dl — AB (ref 70–99)

## 2022-05-21 LAB — POCT GLYCOSYLATED HEMOGLOBIN (HGB A1C): HbA1c, POC (controlled diabetic range): 8.1 % — AB (ref 0.0–7.0)

## 2022-05-21 MED ORDER — INSULIN NPH ISOPHANE & REGULAR (70-30) 100 UNIT/ML ~~LOC~~ SUSP
15.0000 [IU] | Freq: Two times a day (BID) | SUBCUTANEOUS | 3 refills | Status: AC
  Filled 2022-05-21: qty 10, 33d supply, fill #0

## 2022-05-21 MED ORDER — FLUCONAZOLE 150 MG PO TABS
150.0000 mg | ORAL_TABLET | Freq: Every day | ORAL | 1 refills | Status: AC
  Filled 2022-05-21: qty 5, 5d supply, fill #0
  Filled 2022-06-06: qty 5, 5d supply, fill #1

## 2022-05-21 NOTE — Progress Notes (Signed)
Subjective:  Patient ID: Rachael Russo, female    DOB: [birth year]  Age: 47 y.o. MRN: [id number]  CC: Diabetes   HPI Rachael Russo is a 47 y.o. year old female with a history of type 2 diabetes mellitus (A1c 8.1), hypertension, Graves' disease who presents today for chronic disease management.  Interval History:  She Complains of vaginal itching in the absence of vaginal discharge for a while. Last year she received Diflucan pill which was beneficial for a short while but after that symptoms returned.  A1c is 8.1 up from 7.3. She has been unable to obtain [place] via medication assistance. Currently administering 15 units of NPH at night and 10 units in the morning even though chart reveals she should be on 19 units twice daily.  She states that she has had hypoglycemia of up to the 60s when she administered the full dose recommended on her chart. She has no neuropathy or visual concerns. Endorses adherence with methimazole and with her antihypertensive.  Past Medical History:  Diagnosis Date   AMA (advanced maternal age) multigravida 35+ 05/09/2013   Offered genetic screening, discussed options, patient desires quad screen Clinic [facility]  Dating LMP consistent with 10 week scan  Genetic Screen  Quad:                  NIPS:  Anatomic [place]   GTT Type II DM  TDaP vaccine   Flu vaccine   GBS   Baby Food Breast  Contraception Condoms  Pediatrician [name]  Support Person Mother       AMA (advanced maternal age) multigravida 35+ 05/09/2013   Offered genetic screening, discussed options, patient desires quad screen Clinic [facility]  Dating LMP consistent with 10 week scan  Genetic Screen  Quad normal  Anatomic [place] Limited, will reevaluate on repeat scans  GTT Type II DM  TDaP vaccine   Flu vaccine   GBS   Baby Food Breast  Contraception Condoms  Pediatrician [name]  Support Person Mother       Diabetes mellitus    LGA (large for gestational age) fetus affecting mother,  antepartum 11/17/2013   11/16/2013 EFW 3747gm >90%tile     Past Surgical History:  Procedure Laterality Date   CESAREAN SECTION     CESAREAN SECTION N/A 12/03/2013   Procedure: CESAREAN SECTION;  Surgeon: Allie Bossier, MD;  Location: [facility];  Service: Obstetrics;  Laterality: N/A;    Family History  Problem Relation Age of Onset   Diabetes Mother    Breast cancer Neg Hx     Social History   Socioeconomic History   Marital status: Married    Spouse name: Not on file   Number of children: Not on file   Years of education: Not on file   Highest education level: Not on file  Occupational History   Not on file  Tobacco Use   Smoking status: Never   Smokeless tobacco: Never  Substance and Sexual Activity   Alcohol use: No   Drug use: No   Sexual activity: Yes    Birth control/protection: Condom, Surgical  Other Topics Concern   Not on file  Social History Narrative   Not on file   Social Determinants of Health   Financial Resource Strain: Not on file  Food Insecurity: Not on file  Transportation Needs: Not on file  Physical Activity: Not on file  Stress: Not on file  Social Connections: Not on file  No Known Allergies  Outpatient Medications Prior to Visit  Medication Sig Dispense Refill   atorvastatin (LIPITOR) 10 MG tablet Take 1 tablet (10 mg total) by mouth daily. 90 tablet 1   Blood Glucose Monitoring Suppl (TRUE METRIX METER) w/Device KIT 1 each by Does not apply route 3 (three) times daily. 1 kit 0   glucose blood (TRUE METRIX BLOOD GLUCOSE TEST) test strip 1 each by Other route 3 (three) times daily. 100 each 3   ibuprofen (ADVIL) 600 MG tablet Take 1 tablet (600 mg total) by mouth every 8 (eight) hours as needed. 60 tablet 2   lisinopril (ZESTRIL) 2.5 MG tablet Take 1 tablet (2.5 mg total) by mouth daily. 90 tablet 1   metFORMIN (GLUCOPHAGE-XR) 500 MG 24 hr tablet Take 2 tablets (1,000 mg total) by mouth 2 (two) times daily. 360 tablet 1   methimazole  (TAPAZOLE) 5 MG tablet Take 1 tablet (5 mg total) by mouth 2 (two) times daily. 60 tablet 6   metoprolol succinate (TOPROL-XL) 25 MG 24 hr tablet Take 1 tablet (25 mg total) by mouth daily. 90 tablet 1   Prenatal Vit-Fe Fumarate-FA (PRENATAL MULTIVITAMIN) TABS tablet Take 1 tablet by mouth daily. 30 tablet 2   TRUEplus Lancets 28G MISC 1 each by Does not apply route 2 (two) times daily. 100 each 6   gabapentin (NEURONTIN) 300 MG capsule Take 1 capsule (300 mg total) by mouth at bedtime. (Patient not taking: Reported on 05/21/2022) 90 capsule 1   dapagliflozin propanediol (FARXIGA) 5 MG TABS tablet Take 1 tablet (5 mg total) by mouth daily before breakfast. (Patient not taking: Reported on 05/21/2022) 90 tablet 1   insulin NPH-regular Human (70-30) 100 UNIT/ML injection Inject 19 Units into the skin 2 (two) times daily with a meal. 30 mL 3   No facility-administered medications prior to visit.     ROS Review of Systems  Constitutional:  Negative for activity change and appetite change.  HENT:  Negative for sinus pressure and sore throat.   Respiratory:  Negative for chest tightness, shortness of breath and wheezing.   Cardiovascular:  Negative for chest pain and palpitations.  Gastrointestinal:  Negative for abdominal distention, abdominal pain and constipation.  Genitourinary: Negative.   Musculoskeletal: Negative.   Psychiatric/Behavioral:  Negative for behavioral problems and dysphoric mood.     Objective:  BP 139/78   Pulse 80   Temp 98.4 F (36.9 C) (Oral)   Wt 143 lb (64.9 kg)   SpO2 100%   BMI 26.16 kg/m      05/21/2022    8:43 AM 02/13/2022    9:31 AM 08/07/2021    9:01 AM  BP/Weight  Systolic BP 409 811 914  Diastolic BP 78 74 77  Wt. (Lbs) 143 140 144.6  BMI 26.16 kg/m2 25.61 kg/m2 25.61 kg/m2      Physical Exam Constitutional:      Appearance: She is well-developed.  Cardiovascular:     Rate and Rhythm: Normal rate.     Heart sounds: Normal heart sounds. No  murmur heard. Pulmonary:     Effort: Pulmonary effort is normal.     Breath sounds: Normal breath sounds. No wheezing or rales.  Chest:     Chest wall: No tenderness.  Abdominal:     General: Bowel sounds are normal. There is no distension.     Palpations: Abdomen is soft. There is no mass.     Tenderness: There is no abdominal tenderness.  Musculoskeletal:  General: Normal range of motion.     Right lower leg: No edema.     Left lower leg: No edema.  Neurological:     Mental Status: She is alert and oriented to person, place, and time.  Psychiatric:        Mood and Affect: Mood normal.    Diabetic Foot Exam - Simple   Simple Foot Form Diabetic Foot exam was performed with the following findings: Yes 05/21/2022  8:58 AM  Visual Inspection No deformities, no ulcerations, no other skin breakdown bilaterally: Yes Sensation Testing Intact to touch and monofilament testing bilaterally: Yes Pulse Check Posterior Tibialis and Dorsalis pulse intact bilaterally: Yes Comments        Latest Ref Rng & Units 02/13/2022    9:56 AM 02/05/2021    9:22 AM 04/26/2020   10:02 AM  CMP  Glucose 70 - 99 mg/dL 129  137  135   BUN 6 - 24 mg/dL 13  10  10    Creatinine 0.57 - 1.00 mg/dL 0.67  0.69  0.74   Sodium 134 - 144 mmol/L 140  142  142   Potassium 3.5 - 5.2 mmol/L 4.9  4.7  4.7   Chloride 96 - 106 mmol/L 103  104  103   CO2 20 - 29 mmol/L 22  22  21    Calcium 8.7 - 10.2 mg/dL 9.6  9.5  9.9   Total Protein 6.0 - 8.5 g/dL 7.1  7.2  7.4   Total Bilirubin 0.0 - 1.2 mg/dL 0.2  0.3  0.3   Alkaline Phos 44 - 121 IU/L 101  96  104   AST 0 - 40 IU/L 15  14  16    ALT 0 - 32 IU/L 16  15  17      Lipid Panel     Component Value Date/Time   CHOL 159 02/13/2022 0956   TRIG 45 02/13/2022 0956   HDL 65 02/13/2022 0956   CHOLHDL 2.4 02/13/2022 0956   CHOLHDL 3.7 02/01/2016 1703   VLDL 21 02/01/2016 1703   LDLCALC 84 02/13/2022 0956    CBC    Component Value Date/Time   WBC 9.2  06/12/2014 1035   RBC 4.53 06/12/2014 1035   HGB 13.4 06/12/2014 1035   HCT 38.2 06/12/2014 1035   PLT 277 06/12/2014 1035   MCV 84.3 06/12/2014 1035   MCH 29.6 06/12/2014 1035   MCHC 35.1 06/12/2014 1035   RDW 12.2 06/12/2014 1035   LYMPHSABS 2.5 04/27/2013 0840   MONOABS 0.6 04/27/2013 0840   EOSABS 0.0 04/27/2013 0840   BASOSABS 0.0 04/27/2013 0840    Lab Results  Component Value Date   HGBA1C 8.1 (A) 05/21/2022    Lab Results  Component Value Date   TSH 0.409 (L) 02/13/2022    Assessment & Plan:  1. Type 2 diabetes mellitus with other specified complication, with long-term current use of insulin (HCC) Uncontrolled with A1c of 8.1, goal is less than 7.0 She has had some hypoglycemic episodes. She has been unable to obtain Farxiga through the medication assistance program Will change NPH from 19 units twice daily to 15 units twice daily and advised to decrease by 2 units if hypoglycemia occurs Counseled on Diabetic diet, my plate method, 102 minutes of moderate intensity exercise/week Blood sugar logs with fasting goals of 80-120 mg/dl, random of less than 180 and in the event of sugars less than 60 mg/dl or greater than 400 mg/dl encouraged to notify the clinic.  Advised on the need for annual eye exams, annual foot exams, Pneumonia vaccine. - POCT glucose (manual entry) - POCT glycosylated hemoglobin (Hb A1C) - Microalbumin / creatinine urine ratio - LP+Non-HDL Cholesterol - CMP14+EGFR - insulin NPH-regular Human (70-30) 100 UNIT/ML injection; Inject 15 Units into the skin 2 (two) times daily with a meal.  Dispense: 30 mL; Refill: 3  2. Screening for colon cancer - Fecal occult blood, imunochemical([facility])  3. Vaginal candidiasis Hyperglycemic state contributing - fluconazole (DIFLUCAN) 150 MG tablet; Take 1 tablet (150 mg total) by mouth daily.  Dispense: 5 tablet; Refill: 1  4. Graves disease Last TSH was mildly suppressed, will check thyroid panel and  adjust regimen accordingly. - T4, free - TSH - T3    Meds ordered this encounter  Medications   fluconazole (DIFLUCAN) 150 MG tablet    Sig: Take 1 tablet (150 mg total) by mouth daily.    Dispense:  5 tablet    Refill:  1   insulin NPH-regular Human (70-30) 100 UNIT/ML injection    Sig: Inject 15 Units into the skin 2 (two) times daily with a meal.    Dispense:  30 mL    Refill:  3    Follow-up: Return in about 3 months (around 08/19/2022) for Chronic medical conditions.       Charlott Rakes, MD, FAAFP. [facility] and [place], [place]   05/21/2022, 9:36 AM

## 2022-05-21 NOTE — Progress Notes (Signed)
Vagina itching

## 2022-05-23 ENCOUNTER — Other Ambulatory Visit: Payer: Self-pay

## 2022-05-23 ENCOUNTER — Other Ambulatory Visit: Payer: Self-pay | Admitting: Family Medicine

## 2022-05-23 DIAGNOSIS — E0500 Thyrotoxicosis with diffuse goiter without thyrotoxic crisis or storm: Secondary | ICD-10-CM

## 2022-05-23 LAB — MICROALBUMIN / CREATININE URINE RATIO
Creatinine, Urine: 24.9 mg/dL
Microalb/Creat Ratio: <12 mg/g creat (ref 0–29)
Microalbumin, Urine: <3 ug/mL

## 2022-05-23 LAB — CMP14+EGFR
ALT: 15 IU/L (ref 0–32)
AST: 15 IU/L (ref 0–40)
Albumin/Globulin Ratio: 1.9 (ref 1.2–2.2)
Albumin: 5 g/dL — ABNORMAL HIGH (ref 3.9–4.9)
Alkaline Phosphatase: 117 IU/L (ref 44–121)
BUN/Creatinine Ratio: 15 (ref 9–23)
BUN: 10 mg/dL (ref 6–24)
Bilirubin Total: <0.2 mg/dL (ref 0.0–1.2)
CO2: 21 mmol/L (ref 20–29)
Calcium: 9.7 mg/dL (ref 8.7–10.2)
Chloride: 101 mmol/L (ref 96–106)
Creatinine, Ser: 0.66 mg/dL (ref 0.57–1.00)
Globulin, Total: 2.6 g/dL (ref 1.5–4.5)
Glucose: 187 mg/dL — ABNORMAL HIGH (ref 70–99)
Potassium: 5 mmol/L (ref 3.5–5.2)
Sodium: 139 mmol/L (ref 134–144)
Total Protein: 7.6 g/dL (ref 6.0–8.5)
eGFR: 109 mL/min/{1.73_m2} (ref >59)

## 2022-05-23 LAB — T4, FREE: Free T4: 1.12 ng/dL (ref 0.82–1.77)

## 2022-05-23 LAB — T3: T3, Total: 104 ng/dL (ref 71–180)

## 2022-05-23 LAB — LP+NON-HDL CHOLESTEROL
Cholesterol, Total: 165 mg/dL (ref 100–199)
HDL: 75 mg/dL (ref >39)
LDL Chol Calc (NIH): 81 mg/dL (ref 0–99)
Total Non-HDL-Chol (LDL+VLDL): 90 mg/dL (ref 0–129)
Triglycerides: 43 mg/dL (ref 0–149)
VLDL Cholesterol Cal: 9 mg/dL (ref 5–40)

## 2022-05-23 LAB — TSH: TSH: 1.41 u[IU]/mL (ref 0.450–4.500)

## 2022-05-23 MED ORDER — METHIMAZOLE 5 MG PO TABS
5.0000 mg | ORAL_TABLET | Freq: Two times a day (BID) | ORAL | 6 refills | Status: AC
  Filled 2022-05-23 – 2022-05-28 (×2): qty 60, 30d supply, fill #0

## 2022-05-28 ENCOUNTER — Other Ambulatory Visit: Payer: Self-pay | Admitting: Family Medicine

## 2022-05-28 ENCOUNTER — Other Ambulatory Visit: Payer: Self-pay

## 2022-05-28 DIAGNOSIS — E1165 Type 2 diabetes mellitus with hyperglycemia: Secondary | ICD-10-CM

## 2022-05-28 MED ORDER — TRUE METRIX BLOOD GLUCOSE TEST VI STRP
1.0000 | ORAL_STRIP | Freq: Three times a day (TID) | 2 refills | Status: AC
  Filled 2022-05-28: qty 100, 34d supply, fill #0
  Filled 2022-07-07: qty 100, 34d supply, fill #1
  Filled 2022-08-11: qty 100, 34d supply, fill #2

## 2022-05-28 MED ORDER — TRUEPLUS LANCETS 28G MISC
1.0000 | Freq: Two times a day (BID) | 2 refills | Status: AC
  Filled 2022-05-28: qty 100, 33d supply, fill #0
  Filled 2022-07-07: qty 100, 33d supply, fill #1
  Filled 2022-08-11: qty 100, 33d supply, fill #2

## 2022-05-30 ENCOUNTER — Ambulatory Visit: Payer: Self-pay | Attending: Family Medicine

## 2022-06-03 ENCOUNTER — Other Ambulatory Visit: Payer: Self-pay

## 2022-06-06 ENCOUNTER — Other Ambulatory Visit: Payer: Self-pay

## 2022-07-07 ENCOUNTER — Other Ambulatory Visit: Payer: Self-pay

## 2022-08-11 ENCOUNTER — Other Ambulatory Visit: Payer: Self-pay

## 2022-08-21 ENCOUNTER — Ambulatory Visit: Payer: Self-pay | Attending: Family Medicine | Admitting: Family Medicine
